# Patient Record
Sex: Female | Born: 1965
Health system: Southern US, Community
[De-identification: ages and names within clinical notes are randomized; demographics above are authoritative.]

## PROBLEM LIST (undated history)

## (undated) DIAGNOSIS — T4145XA Adverse effect of unspecified anesthetic, initial encounter: Secondary | ICD-10-CM

## (undated) DIAGNOSIS — R011 Cardiac murmur, unspecified: Secondary | ICD-10-CM

## (undated) DIAGNOSIS — T8859XA Other complications of anesthesia, initial encounter: Secondary | ICD-10-CM

## (undated) DIAGNOSIS — M1990 Unspecified osteoarthritis, unspecified site: Secondary | ICD-10-CM

## (undated) DIAGNOSIS — G8929 Other chronic pain: Secondary | ICD-10-CM

## (undated) DIAGNOSIS — R002 Palpitations: Secondary | ICD-10-CM

## (undated) DIAGNOSIS — F419 Anxiety disorder, unspecified: Secondary | ICD-10-CM

## (undated) DIAGNOSIS — M19072 Primary osteoarthritis, left ankle and foot: Secondary | ICD-10-CM

## (undated) DIAGNOSIS — I471 Supraventricular tachycardia, unspecified: Secondary | ICD-10-CM

## (undated) DIAGNOSIS — R7303 Prediabetes: Secondary | ICD-10-CM

## (undated) DIAGNOSIS — M1711 Unilateral primary osteoarthritis, right knee: Secondary | ICD-10-CM

## (undated) DIAGNOSIS — E669 Obesity, unspecified: Secondary | ICD-10-CM

## (undated) DIAGNOSIS — M549 Dorsalgia, unspecified: Secondary | ICD-10-CM

## (undated) DIAGNOSIS — I10 Essential (primary) hypertension: Secondary | ICD-10-CM

## (undated) DIAGNOSIS — K219 Gastro-esophageal reflux disease without esophagitis: Secondary | ICD-10-CM

## (undated) DIAGNOSIS — D649 Anemia, unspecified: Secondary | ICD-10-CM

## (undated) HISTORY — DX: Gastro-esophageal reflux disease without esophagitis: K21.9

## (undated) HISTORY — PX: BREAST BIOPSY: SHX20

## (undated) HISTORY — PX: LAPAROSCOPIC CHOLECYSTECTOMY: SUR755

## (undated) HISTORY — DX: Unilateral primary osteoarthritis, right knee: M17.11

## (undated) HISTORY — PX: WISDOM TOOTH EXTRACTION: SHX21

## (undated) HISTORY — PX: OTHER SURGICAL HISTORY: SHX169

## (undated) HISTORY — DX: Obesity, unspecified: E66.9

## (undated) HISTORY — DX: Other chronic pain: G89.29

## (undated) HISTORY — DX: Primary osteoarthritis, left ankle and foot: M19.072

## (undated) HISTORY — PX: LIPOMA RESECTION: SHX23

## (undated) HISTORY — DX: Palpitations: R00.2

## (undated) HISTORY — DX: Unspecified osteoarthritis, unspecified site: M19.90

---

## 2006-11-28 ENCOUNTER — Ambulatory Visit: Payer: Self-pay | Admitting: Internal Medicine

## 2006-12-07 ENCOUNTER — Ambulatory Visit: Payer: Self-pay

## 2006-12-08 ENCOUNTER — Encounter: Admission: RE | Admit: 2006-12-08 | Discharge: 2006-12-08 | Payer: Self-pay | Admitting: Internal Medicine

## 2007-03-22 ENCOUNTER — Encounter: Admission: RE | Admit: 2007-03-22 | Discharge: 2007-03-22 | Payer: Self-pay | Admitting: Internal Medicine

## 2007-04-06 ENCOUNTER — Ambulatory Visit: Payer: Self-pay | Admitting: Internal Medicine

## 2007-04-09 ENCOUNTER — Ambulatory Visit: Payer: Self-pay | Admitting: Internal Medicine

## 2007-04-09 LAB — CONVERTED CEMR LAB
BUN: 8 mg/dL (ref 6–23)
Basophils Absolute: 0.1 10*3/uL (ref 0.0–0.1)
Basophils Relative: 0.8 % (ref 0.0–1.0)
CO2: 33 meq/L — ABNORMAL HIGH (ref 19–32)
Calcium: 9.4 mg/dL (ref 8.4–10.5)
Chloride: 101 meq/L (ref 96–112)
Creatinine, Ser: 0.7 mg/dL (ref 0.4–1.2)
Eosinophils Absolute: 0 10*3/uL (ref 0.0–0.6)
Eosinophils Relative: 0.7 % (ref 0.0–5.0)
GFR calc Af Amer: 119 mL/min
GFR calc non Af Amer: 98 mL/min
Glucose, Bld: 138 mg/dL — ABNORMAL HIGH (ref 70–99)
HCT: 37.6 % (ref 36.0–46.0)
Hemoglobin: 12.4 g/dL (ref 12.0–15.0)
INR: 1 (ref 0.8–1.0)
Lymphocytes Relative: 43.8 % (ref 12.0–46.0)
MCHC: 32.9 g/dL (ref 30.0–36.0)
MCV: 95.5 fL (ref 78.0–100.0)
Monocytes Absolute: 0.5 10*3/uL (ref 0.2–0.7)
Monocytes Relative: 7.1 % (ref 3.0–11.0)
Neutro Abs: 3.1 10*3/uL (ref 1.4–7.7)
Neutrophils Relative %: 47.6 % (ref 43.0–77.0)
Platelets: 283 10*3/uL (ref 150–400)
Potassium: 3.3 meq/L — ABNORMAL LOW (ref 3.5–5.1)
Prothrombin Time: 12 s (ref 10.9–13.3)
RBC: 3.93 M/uL (ref 3.87–5.11)
RDW: 12.6 % (ref 11.5–14.6)
Sodium: 141 meq/L (ref 135–145)
WBC: 6.5 10*3/uL (ref 4.5–10.5)
aPTT: 36.9 s — ABNORMAL HIGH (ref 21.7–29.8)

## 2007-04-10 ENCOUNTER — Inpatient Hospital Stay (HOSPITAL_BASED_OUTPATIENT_CLINIC_OR_DEPARTMENT_OTHER): Admission: RE | Admit: 2007-04-10 | Discharge: 2007-04-10 | Payer: Self-pay | Admitting: Internal Medicine

## 2007-04-10 ENCOUNTER — Ambulatory Visit: Payer: Self-pay | Admitting: Cardiovascular Disease

## 2007-04-16 ENCOUNTER — Ambulatory Visit: Payer: Self-pay

## 2007-12-17 ENCOUNTER — Encounter: Admission: RE | Admit: 2007-12-17 | Discharge: 2007-12-17 | Payer: Self-pay | Admitting: Obstetrics and Gynecology

## 2008-07-03 ENCOUNTER — Encounter: Payer: Self-pay | Admitting: Internal Medicine

## 2008-09-14 ENCOUNTER — Ambulatory Visit (HOSPITAL_COMMUNITY): Admission: RE | Admit: 2008-09-14 | Discharge: 2008-09-14 | Payer: Self-pay | Admitting: Gastroenterology

## 2008-11-04 ENCOUNTER — Encounter: Payer: Self-pay | Admitting: Internal Medicine

## 2008-11-20 ENCOUNTER — Ambulatory Visit: Payer: Self-pay | Admitting: Cardiology

## 2008-11-20 ENCOUNTER — Encounter: Payer: Self-pay | Admitting: Nurse Practitioner

## 2008-11-20 DIAGNOSIS — R002 Palpitations: Secondary | ICD-10-CM | POA: Insufficient documentation

## 2008-11-20 DIAGNOSIS — J309 Allergic rhinitis, unspecified: Secondary | ICD-10-CM | POA: Insufficient documentation

## 2008-11-20 DIAGNOSIS — R079 Chest pain, unspecified: Secondary | ICD-10-CM | POA: Insufficient documentation

## 2008-11-20 DIAGNOSIS — K219 Gastro-esophageal reflux disease without esophagitis: Secondary | ICD-10-CM | POA: Insufficient documentation

## 2008-11-26 ENCOUNTER — Telehealth (INDEPENDENT_AMBULATORY_CARE_PROVIDER_SITE_OTHER): Payer: Self-pay | Admitting: *Deleted

## 2008-12-05 ENCOUNTER — Ambulatory Visit: Payer: Self-pay | Admitting: Internal Medicine

## 2008-12-10 ENCOUNTER — Telehealth (INDEPENDENT_AMBULATORY_CARE_PROVIDER_SITE_OTHER): Payer: Self-pay | Admitting: *Deleted

## 2009-01-07 ENCOUNTER — Ambulatory Visit: Payer: Self-pay | Admitting: Internal Medicine

## 2009-01-08 ENCOUNTER — Encounter (INDEPENDENT_AMBULATORY_CARE_PROVIDER_SITE_OTHER): Payer: Self-pay | Admitting: *Deleted

## 2009-02-16 ENCOUNTER — Encounter: Admission: RE | Admit: 2009-02-16 | Discharge: 2009-02-16 | Payer: Self-pay | Admitting: Obstetrics and Gynecology

## 2009-03-14 DIAGNOSIS — R002 Palpitations: Secondary | ICD-10-CM

## 2009-03-14 HISTORY — DX: Palpitations: R00.2

## 2009-03-31 ENCOUNTER — Telehealth: Payer: Self-pay | Admitting: Internal Medicine

## 2009-04-08 ENCOUNTER — Telehealth (INDEPENDENT_AMBULATORY_CARE_PROVIDER_SITE_OTHER): Payer: Self-pay | Admitting: *Deleted

## 2009-04-14 ENCOUNTER — Telehealth: Payer: Self-pay | Admitting: Internal Medicine

## 2010-03-12 ENCOUNTER — Encounter
Admission: RE | Admit: 2010-03-12 | Discharge: 2010-03-12 | Payer: Self-pay | Source: Home / Self Care | Attending: Obstetrics and Gynecology | Admitting: Obstetrics and Gynecology

## 2010-03-22 ENCOUNTER — Encounter
Admission: RE | Admit: 2010-03-22 | Discharge: 2010-03-22 | Payer: Self-pay | Source: Home / Self Care | Attending: Obstetrics and Gynecology | Admitting: Obstetrics and Gynecology

## 2010-04-13 NOTE — Assessment & Plan Note (Signed)
Summary: Cardiology Office Visit   Visit Type:  Follow-up  CC:  Palpitations .  History of Present Illness: 45 year old African American female with prior history of chest pain with normal catheterization January 2009.  She also has a long history of palpitations for which she takes p.r.n. propranolol.  Following her catheterization January 2009 she had not had any palpitations or need to take propranolol but about 2 weeks ago she started having increased, infrequent, single skipped beats/palpitations occurring one or 2 times an hour.  She saw her primary care provider and electrolytes and other lab work performed all of which was within normal limits.  with the usage of propranolol she had reduced events of palpitations but this past "Sunday she didn't take it and noted more frequent palpitations although no sustained runs.  Since this past Sunday she been taking propranolol daily and thinks that she's only had one or 2 skipped beats for the period of the week.  She denies any associated lightheadedness, presyncope, syncope, nausea, vomiting, chest pain, shortness of breath, or diaphoresis.  She reports that she's been under a lot of stress since going back to work.  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  EKG  Procedure date:  11/20/2008  Findings:      regular sinus rhythm, 82, no acute ST or T changes.   Current Medications (verified): 1)  Propranolol Hcl 20 Mg Tabs (Propranolol Hcl) .... Take 1 Tablet By Mouth Once A Day 2)  Maxzide-25 37.5-25 Mg Tabs (Triamterene-Hctz) .... 1/2 Tablet Daily 3)  Dexalant 60mg Tablet .... Take 1 Tablet By Mouth Once A Day 4)  Flovent Hfa 44 Mcg/act Aero (Fluticasone Propionate  Hfa) .... 2 Puffs As Needed 5)  Align  Caps (Probiotic Product) .... Twice A Day 6)  Stool Softener 100 Mg Caps (Docusate Sodium) .... As Needed 7)  Vitamin D 1000 Unit  Tabs  (Cholecalciferol) .... About 4x A Week 8)  Xanax 0.5 Mg Tabs (Alprazolam) .... As Needed  Allergies (verified): 1)  ! Doxycycline  Past History:  Past Medical History: Chest Pain      a.  S/P Normal Cath Jan. 2009.  NL EF.  GERD Allergies Palpitations  Family History: Reviewed history from 11/20/2008 and no changes required. Mother Mitral Valve Prolapse  Social History: She is married.  She has two children.  She does not   use cigarettes, alcohol or recreational drugs.  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  She reports a lot of work related stress.  Review of Systems       As per HPI.  OTW all systems reviewed and negative.  Vital Signs:  Patient profile:   45 year old female Height:      66 inches Weight:      164 pounds BMI:     26.57 Pulse rate:   81 / minute Pulse rhythm:   regular Resp:     12" 8 per minute BP sitting:   128 / 80  (right arm)  Vitals Entered By: Vikki Ports (November 20, 2008 9:17 AM)  Physical Exam  General:  Well developed, well nourished, in no acute distress. Head:  HEENT: NL Neck:  supple without bruits or JVD. Lungs:  Clear bilaterally to auscultation and percussion. Heart:  regular S1, S2, no S3, S4, or murmurs. Abdomen:  soft, nontender, nondistended, bowel sounds present x4. Msk:  Back normal, normal gait. Muscle strength and tone normal. Pulses:  pulses normal in all 4 extremities Extremities:  no clubbing, cyanosis, or edema. Neurologic:  Alert and oriented x 3. Skin:  Intact without lesions or rashes. Psych:  Normal affect.   Impression & Recommendations:  Problem # 1:  PALPITATIONS (ICD-785.1) patient has had increased palpitations over the past 2 weeks which he associates with going back to work.  She has now been on propranolol daily since this past Sunday and has had significant improvement.  I recommended she continue her propranolol.  We will obtain a 48-hour Holter monitor to try and better  define what she is experiencing. Orders: Holter Monitor (Holter Monitor)  Problem # 2:  GERD (ICD-530.81) she has struggled with this over the summer and is being followed by gastroenterology.  Problem # 3:  CHEST PAIN (ICD-786.50) no recurrence.  She had a normal catheterization in January 2009.  Patient Instructions: 1)  Your physician has recommended that you wear a holter monitor.  Holter monitors are medical devices that record the heart's electrical activity. Doctors most often use these monitors to diagnose arrhythmias. Arrhythmias are problems with the speed or rhythm of the heartbeat. The monitor is a small, portable device. You can wear one while you do your normal daily activities. This is usually used to diagnose what is causing palpitations/syncope (passing out). 2)  Your physician recommends that you continue on your current medications as directed. Please refer to the Current Medication list given to you today. 3)  Your physician recommends that you schedule a follow-up appointment in: 4 months with Dr. Graciela Husbands.

## 2010-04-13 NOTE — Progress Notes (Signed)
Summary: RETURNED CALL  Phone Note Call from Patient Call back at [phone]   Caller: Mom Summary of Call: RETURNED CALL REGARDING MONITOR Initial call taken by: Burnard Leigh,  November 26, 2008 11:00 AM  Additional Follow-up for Phone Call Additional follow up Details #1::        Monitor was done Additional Follow-up by: Marcos Eke,  January 26, 2009 4:14 PM

## 2010-04-13 NOTE — Progress Notes (Signed)
  Phone Note Outgoing Call   Call placed by: Duncan Dull, RN, BSN,  December 10, 2008 2:42 PM Call placed to: Patient Summary of Call: To discuss holter results Called patient and left message on machine Duncan Dull, RN, BSN  December 10, 2008 2:42 PM No information colled from the holter. The noted SVT appeared to be artifact. If she is having symptoms then she will need to follow up with an event recorder. If went order an event recorder, Dr. Graciela Husbands will need to send a letter to pt's insurance company asking them to pay for the additional test.  Initial call taken by: Duncan Dull, RN, BSN,  December 10, 2008 2:44 PM     Appended Document:  S/W pt and she feels better since she started the propranolol. She wishes to make an appt to see Dr. Graciela Husbands to give herself more time and they will discuss the event monitor and the need for it or not at that time.

## 2010-04-13 NOTE — Miscellaneous (Signed)
Summary: Orders Update  Clinical Lists Changes  Orders: Added new Service order of EKG w/ Interpretation (93000) - Signed 

## 2010-04-13 NOTE — Progress Notes (Signed)
  Phone Note Other Incoming   Caller: Patient Summary of Call: She was returning my call. She would like a female councelor. I will pass this on to Dr. Graciela Husbands tomorrow Duncan Dull, RN, BSN  April 08, 2009 4:27 PM   Follow-up for Phone Call        Called patient and left message on machine with information she requested. Per Dr. Graciela Husbands, the ladies name was Colen Darling, per internet [phone]. Follow-up by: Duncan Dull, RN, BSN,  April 15, 2009 3:03 PM

## 2010-04-13 NOTE — Progress Notes (Signed)
Summary: regarding referral  Phone Note Call from Patient Call back at [phone]   Caller: Patient Reason for Call: Talk to Nurse Summary of Call: haven't heard back from [facility] regading the counsellor's name that Dr. Graciela Husbands is refering her too Initial call taken by: Omer Jack,  April 14, 2009 11:07 AM  Follow-up for Phone Call        Please see duplicate phone message. Follow-up by: Duncan Dull, RN, BSN,  April 15, 2009 3:04 PM

## 2010-04-13 NOTE — Letter (Signed)
Summary: [facility]  [facility]   Imported By: Roderic Ovens 12/17/2008 16:53:55  _____________________________________________________________________  External Attachment:    Type:   Image     Comment:   External Document

## 2010-04-13 NOTE — Progress Notes (Signed)
Summary:  PT NEEDS REFERRAL  Phone Note Call from Patient Call back at [phone]   Caller: Patient Reason for Call: Talk to Nurse, Talk to Doctor Summary of Call: PT NEEDS SOME NAMES OF CHRISTIAN COUNSELERS.Marland KitchenMarland KitchenAT LAST VISIT MD TOLD HER HE COULD GET THIS INFORMATION...THIS IS TO HELP HER DESTRESS.. Initial call taken by: Omer Jack,  March 31, 2009 1:03 PM  Follow-up for Phone Call        Question per Dr. Graciela Husbands is does she want a man or woman? I will call her on Fri. when I return. Duncan Dull, RN, BSN  April 01, 2009 6:52 PM   Called patient and left message on machine for pt to call me Duncan Dull, RN, BSN  April 03, 2009 11:43 AM

## 2010-04-13 NOTE — Assessment & Plan Note (Signed)
Summary: rov/per Kimberly Boyd   History of Present Illness: Kimberly Boyd is seen at the request of Dr. Loreta Ave because of recurrent problems with palpitations. She describes these as "clips".  She was last seen by me in the fall of 2008. At that time no complaints of chest pain. She underwent CT scan that was negative. Because of persistent pain she subsequently was referred for catheterization that was normal. I reviewed those results morning.  Her symptoms have been abated somewhat. She sees him in the temporal relation to Xanax as she was taking for sleep. 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 She started taking her Inderal again with some improvement.  She continues to complain of pain with related to GE reflux disease  She also describes a great deal of stress related to her recent move to [place], children school work etc. She is actually in tears as she discussed some of these issues.  Problems Prior to Update: 1)  Palpitations  (ICD-785.1) 2)  Allergic Rhinitis  (ICD-477.9) 3)  Gerd  (ICD-530.81) 4)  Chest Pain  (ICD-786.50)  Current Medications (verified): 1)  Propranolol Hcl 20 Mg Tabs (Propranolol Hcl) .... Take 1 Tablet By Mouth Once A Day 2)  Maxzide-25 37.5-25 Mg Tabs (Triamterene-Hctz) .... 1/2 Tablet Daily 3)  Dexalant 60mg  Tablet .... Take 1 Tablet By Mouth Once A Day 4)  Flovent Hfa 44 Mcg/act Aero (Fluticasone Propionate  Hfa) .... 2 Puffs As Needed 5)  Align  Caps (Probiotic Product) .... Twice A Day 6)  Stool Softener 100 Mg Caps (Docusate Sodium) .... As Needed 7)  Vitamin D 1000 Unit  Tabs (Cholecalciferol) .... About 4x A Week 8)  Xanax 0.5 Mg Tabs (Alprazolam) .... As Needed  Allergies (verified): 1)  ! Doxycycline  Past History:  Past Medical History: Last updated: 11/20/2008 Chest Pain      a.  S/P Normal Cath Jan. 2009.  NL EF.  GERD Allergies Palpitations  Past Surgical History: Last updated:  11/20/2008  1. A laparoscopic cholecystectomy.   2. Lipoma resection and redone.   3. Scar revision for the lipoma.   4. Dermoid cyst removal.   Social History: Last updated: 11/20/2008 She is married.  She has two children.  She does not   use cigarettes, alcohol or recreational drugs.  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  She reports a lot of work related stress.  Vital Signs:  Patient profile:   45 year old female Height:      66 inches Weight:      163 pounds BMI:     26.40 Pulse rate:   77 / minute BP sitting:   118 / 76  (left arm) Cuff size:   regular  Vitals Entered By: Hardin Negus, RMA (January 07, 2009 12:22 PM)  Physical Exam  General:  The patient was alert and oriented in no acute distress. Neck veins were flat, carotids were brisk.  Lungs were clear.  Heart sounds were regular without murmurs or gallops.  Abdomen was soft with active bowel sounds. There is no clubbing cyanosis or edema. Skin Warm and dry    Cardiac Cath  Procedure date:  04/10/2007  Findings:       IMPRESSION:  The patient has no coronary anomalies, no evidence of spasm   or bridging, no epicardial coronary disease.  Her chest pain would   appear to be noncardiac in etiology.  EKG  Procedure date:  04/10/2007  Findings:      Sinus rhythm at 77 Intervals 0.14/0.09/0.40 Axis LXXIX Otherwise normal  EKG  Procedure date:  01/07/2009  Findings:      sinus rhythm at 77 Intervals 0.1/0.09/0.40 Axis LXXIX  Impression & Recommendations:  Problem # 1:  PALPITATIONS (ICD-785.1) Her palpitations are currently fairly well controlled with her propranolol. She takes it at night. It is interesting to me at the short-acting dose seems to cover most of the day. We'll also discussed the potential benefits of biofeedback as an alternative therapy for palpitations. Her updated medication list for this problem includes:    Propranolol Hcl 20 Mg Tabs  (Propranolol hcl) .Marland Kitchen... Take 1 tablet by mouth once a day  Problem # 2:  STRESS (ICD-300.00) more than 50% of our greater than 25 minute visit was spent counseling on stress and discussing e potential management strategies;  she'll discuss some of these with her husband and get back with Kimberly Boyd regarding pursuing some of them.  Patient Instructions: 1)  Your physician recommends that you schedule a follow-up appointment AS NEEDED.

## 2010-07-27 NOTE — Letter (Signed)
November 28, 2006    Candyce Churn. Allyne Gee, M.D.  [address]  [address]  [place], [place] [address]   RE:  Kimberly Boyd  MRN:  [id number]  /  DOB:  02/22/1966   Dear Melina Schools:   I hope this letter finds you well.  It was a pleasure to meet Kimberly Boyd, who is Kimberly Boyd' sister-in-law, as you know.  She is a 45-  year-old married mother of two, who has recently moved from [facility] and  [place] certainly  [place] to [place] yet.   She has a history of cardiac complaints that date back a number of  years.  This includes chest pain, for which she underwent a stress test  about four or five years ago and significant palpitations which have  included different symptom complexes, including fluttering, pounding and  racing.  The latter seemed to have largely abated following aggressive  therapy for GE reflux disease.  The fluttering and bounding have  persisted.  She has been monitored a couple of times with event  recorders and these have shown PVCs, non-sustained atrial tachycardia  and on one episode a ventricular couplet which may or may not involve  one artifactual beat.  She does not use caffeine.  Stress has not been a  big component of this in that the symptoms have not been significantly  worse as she has moved from [facility] to [place].   She also describes recently chest discomfort using a Levine sign.  This  occurs when she is walking up hills but not when running up stairs.  It  radiates into her arms bilaterally.  If she does not eat before she  walks, she does not notice it.   Her cardiac risk factors are notable for borderline blood pressure.  Her  family history is negative.  Her lipid status is negative.  Diabetes is  negative.  She does not smoke.   PAST MEDICAL HISTORY:  1. In addition to the above, is notable for the GE reflux disease as      noted.  2. Allergies.   PAST SURGICAL HISTORY:  1. A  laparoscopic cholecystectomy.  2. Lipoma resection and redone.  3. Scar revision for the lipoma.  4. Dermoid cyst removal.   CURRENT MEDICATIONS:  1. Propranolol, taking 20 mg once daily.  2. A recently started PPI, changing out for Prilosec.   ALLERGIES:  She is intolerant of DOXYCYCLINE.   SOCIAL HISTORY:  She is married.  She has two children.  She does not  use cigarettes, alcohol or recreational drugs.   PHYSICAL EXAMINATION:  GENERAL:  She is a healthy-appearing middle-aged  African/American female who appears her stated age of 63.  VITAL SIGNS:  Blood pressure 140/91, pulse 71, weight 187 pounds.  HEENT:  No icterus or xanthoma.  NECK:  Veins are flat.  Carotids brisk and full bilaterally without  bruits.  BACK:  Without kyphosis or scoliosis.  There was a thick keloid scar  over her left back.  HEART:  Sounds were regular without gallops.  There was a 2/6 murmur  (non-revealed).  ABDOMEN:  Soft with active bowel sounds.  No midline pulsation or  hepatomegaly.  PULSES:  Femoral pulses 2+.  Distal pulses intact.  There is no  clubbing, cyanosis or edema.  NEUROLOGIC:  Grossly normal.  SKIN:  Warm and dry.   Electrocardiogram dated today demonstrated a sinus rhythm  at 72 and  there was a 0.17/0.08/0.39.  The axis was 70 degrees.  The  electrocardiogram was otherwise normal.   IMPRESSION:  1. Palpitations including documented      a.     Premature atrial contractions and non-sustained atrial       tachycardia.      b.     Premature ventricular contractions.      c.     Question isolated couplet.  2. Heart racing which is largely improved following treatment for      gastroesophageal reflux disease.  3. Exertional chest discomfort and the use of a Levine sign.  4. Gastroesophageal reflux disease potentially contributing to number      three.  5. Modest degree of anxiety, related to her heart.   Kimberly Boyd, Kimberly Boyd palpitations are likely related to the PACs and  PVCs  that we noted, and I went over that with her.  She is taking propranolol  once a day at night, which is an unusual dosing regimen but it seems to  be working for her, and we decided to leave it at this without  increasing her daytime medication, as there may be some associated  fatigue.   I am concerned about her chest pain, particularly because she uses the  Dewart sign, not withstanding that she had a negative stress test four  years ago.  I also reviewed with her the potential that we will have a  negative test and she will have more pain, and that a catheterization  may be something to consider, given the degree of anxiety she has about  her chest pain.   We will follow up with her following the aforementioned testing.    Sincerely,      Duke Salvia, MD, [facility]  Electronically Signed    SCK/MedQ  DD: 11/28/2006  DT: 11/28/2006  Job #: [id number]

## 2010-07-27 NOTE — Assessment & Plan Note (Signed)
[facility]                            CARDIOLOGY OFFICE NOTE   NAME:GREENLeonor, Darnell                         MRN:          [id number]  DATE:04/16/2007                            DOB:          04/13/1965    Alfonso called the office today due to an ecchymosis in the right groin.  She was catheterized on April 10, 2007.  She had no significant  coronary disease.  I had her come to the office. She was not charged for  it. She had an ecchymosis over the right femoral artery area.  There was  no bruit.  I suspect she had a small late bleed.  There was no evidence  of pseudoaneurysm; however, I told the patient I would feel more  comfortable if she had a duplex exam.  Our ultrasound tech was kind  enough to be able to do this in the next 20 minutes.   The patient was encouraged to put ice on the area at night. She will  limit her activities. She did not want anything stronger than Tylenol or  aspirin for the pain.   So long as her ultrasound shows no evidence of a pseudoaneurysm, she  will be allowed to leave the office today.   Addendum:  Duplex showed no pseudo-aneurysm or fistula     Peter C. Eden Emms, MD, [facility]  Electronically Signed    PCN/MedQ  DD: 04/16/2007  DT: 04/16/2007  Job #: [id number]

## 2010-07-27 NOTE — Assessment & Plan Note (Signed)
[facility]                         ELECTROPHYSIOLOGY OFFICE NOTE   NAME:[name], Boyd                         MRN:          [id number]  DATE:04/06/2007                            DOB:          1965/08/23    SUBJECTIVE:  Kimberly Boyd is seen today again at the request of Dr. Loreta Ave  because of recurrent problems with chest pain.  I had seen her in  September.  At that point there was a positive Levine sign.  We  undertook Myoview scanning; this was negative, notwithstanding the  presence of some chest pain.  This issue of chest pain and exertional  chest pain has been ongoing, however.  The discussion back in September  was that if she had recurring exertional chest pain that a  catheterization with its' positive and negative predictive values and  sensitivity would be an unfortunate, but probably most reasonable,  adjunct.   CURRENT MEDICATIONS:  Her medications currently include Maxzide and  propranolol 20/40.   PHYSICAL EXAMINATION:  VITAL SIGNS:  Her blood pressure is 127/84, her  pulse was 70.  LUNGS:  Clear.  HEART:  Heart sounds were regular.  ABDOMEN:  Soft.  EXTREMITIES:  Were without edema.  SKIN:  Warm and dry.   Electrocardiogram dated today demonstrated a sinus rhythm at 69,  intervals were 0.17/0.08/0.03.  The axis was 75 degrees and the  __________ is otherwise normal.   IMPRESSION:  1. Recurrent exertional chest pain.  2. Negative Myoview scan.  3. Gastroesophageal reflux disease.   The issue that Dr. Loreta Ave has raised is that this chest pain is  incapacitating and it continues to be exertional in nature.  Given the  fact that Myoview scanning and prognosis is in the 90% range, but this  lady's life is persistently disrupted by this chest pain, we have  discussed alternative testing with higher sensitivities including a CTA  and catheterization.  Unfortunately, CTA is not approved here in almost  anybody and thus we have  elected to pursue catheterization.  We have  talked about the potential benefits as well as the potential risks  including but not limited to death and vascular injury.  She understands  these risks and would like to proceed.   We will schedule this for next week as her schedule permits.    Duke Salvia, MD, [facility]  Electronically Signed   SCK/MedQ  DD: 04/06/2007  DT: 04/06/2007  Job #: [id number]   cc:   Anselmo Rod, M.D.

## 2010-07-27 NOTE — Cardiovascular Report (Signed)
NAMEEGYPT, WELCOME                ACCOUNT NO.:  [id number]   MEDICAL RECORD NO.:  [id number]          PATIENT TYPE:  OIB   LOCATION:  1966                         FACILITY:  [facility]   PHYSICIAN:  Peter C. Eden Emms, MD, FACCDATE OF BIRTH:  1965/08/28   DATE OF PROCEDURE:  04/10/2007  DATE OF DISCHARGE:                            CARDIAC CATHETERIZATION   A 45 year old patient with recurrent chest pain.  Rule out coronary  disease.   Cine cath was done with 4-French catheter from the right femoral artery.  Versed 2 mg was given for sedation, 2.5 mg of Lopressor was given for  resting tachycardia.   Left main coronary artery was normal.   Left anterior descending artery was normal.  First diagonal branch was  very large and normal.  Second diagonal branch was medium-sized and  normal.   The LAD was a large vessel and wrapped the apex and supplied most of the  PDA distribution.   Circumflex coronary artery was nondominant and consisted primarily of a  large branching obtuse marginal branch and was normal.   The right coronary artery was dominant but the PDA was small, covering  only the basal aspect of the crux of the heart with the LAD supplying  the rest.  The right coronary artery was normal.   RAO ventriculography:  RAO ventriculography was normal.  EF was 60%.  There is no gradient across the aortic valve and no MR.   LV pressure is 127/10.  Aortic pressure is 127/88.   IMPRESSION:  The patient has no coronary anomalies, no evidence of spasm  or bridging, no epicardial coronary disease.  Her chest pain would  appear to be noncardiac in etiology.   She will follow with Dr. Lelon Perla in 2-3 weeks.      Noralyn Pick. Eden Emms, MD, [facility]  Electronically Signed    PCN/MEDQ  D:  04/10/2007  T:  04/10/2007  Job:  [id number]   cc:   Candyce Churn. Allyne Gee, M.D.  Duke Salvia, MD, [facility]  Kassie Mends, M.D.

## 2010-08-04 ENCOUNTER — Emergency Department (INDEPENDENT_AMBULATORY_CARE_PROVIDER_SITE_OTHER): Payer: Commercial Indemnity

## 2010-08-04 ENCOUNTER — Emergency Department (HOSPITAL_BASED_OUTPATIENT_CLINIC_OR_DEPARTMENT_OTHER)
Admission: EM | Admit: 2010-08-04 | Discharge: 2010-08-04 | Disposition: A | Discharge status: Home / Self Care | Payer: Commercial Indemnity | Attending: Emergency Medicine | Admitting: Emergency Medicine

## 2010-08-04 ENCOUNTER — Ambulatory Visit: Payer: Commercial Indemnity | Admitting: Physical Therapy

## 2010-08-04 DIAGNOSIS — R0602 Shortness of breath: Secondary | ICD-10-CM | POA: Insufficient documentation

## 2010-08-04 DIAGNOSIS — R079 Chest pain, unspecified: Secondary | ICD-10-CM | POA: Insufficient documentation

## 2010-08-04 DIAGNOSIS — R002 Palpitations: Secondary | ICD-10-CM | POA: Insufficient documentation

## 2010-08-04 DIAGNOSIS — K219 Gastro-esophageal reflux disease without esophagitis: Secondary | ICD-10-CM | POA: Insufficient documentation

## 2010-08-04 DIAGNOSIS — I498 Other specified cardiac arrhythmias: Secondary | ICD-10-CM | POA: Insufficient documentation

## 2010-08-04 LAB — DIFFERENTIAL
Basophils Absolute: 0 10*3/uL (ref 0.0–0.1)
Basophils Relative: 0 % (ref 0–1)
Eosinophils Absolute: 0.1 10*3/uL (ref 0.0–0.7)
Eosinophils Relative: 2 % (ref 0–5)
Lymphocytes Relative: 47 % — ABNORMAL HIGH (ref 12–46)
Lymphs Abs: 3.4 10*3/uL (ref 0.7–4.0)
Monocytes Absolute: 0.6 10*3/uL (ref 0.1–1.0)
Monocytes Relative: 8 % (ref 3–12)
Neutro Abs: 3 10*3/uL (ref 1.7–7.7)
Neutrophils Relative %: 42 % — ABNORMAL LOW (ref 43–77)

## 2010-08-04 LAB — BASIC METABOLIC PANEL
BUN: 13 mg/dL (ref 6–23)
CO2: 22 mEq/L (ref 19–32)
Calcium: 9.5 mg/dL (ref 8.4–10.5)
Chloride: 102 mEq/L (ref 96–112)
Creatinine, Ser: 0.6 mg/dL (ref 0.4–1.2)
GFR calc Af Amer: >60 mL/min (ref >60)
GFR calc non Af Amer: >60 mL/min (ref >60)
Glucose, Bld: 117 mg/dL — ABNORMAL HIGH (ref 70–99)
Potassium: 2.7 mEq/L — CL (ref 3.5–5.1)
Sodium: 139 mEq/L (ref 135–145)

## 2010-08-04 LAB — CBC
HCT: 36.1 % (ref 36.0–46.0)
Hemoglobin: 12.7 g/dL (ref 12.0–15.0)
MCH: 31.5 pg (ref 26.0–34.0)
MCHC: 35.2 g/dL (ref 30.0–36.0)
MCV: 89.6 fL (ref 78.0–100.0)
Platelets: 299 10*3/uL (ref 150–400)
RBC: 4.03 MIL/uL (ref 3.87–5.11)
RDW: 13.6 % (ref 11.5–15.5)
WBC: 7.2 10*3/uL (ref 4.0–10.5)

## 2010-08-04 LAB — CARDIAC PANEL(CRET KIN+CKTOT+MB+TROPI)
CK, MB: 1.4 ng/mL (ref 0.3–4.0)
Relative Index: 1.2 (ref 0.0–2.5)
Total CK: 117 U/L (ref 7–177)
Troponin I: <0.3 ng/mL (ref <0.30)

## 2010-08-13 ENCOUNTER — Encounter: Payer: Self-pay | Admitting: Internal Medicine

## 2010-08-23 ENCOUNTER — Telehealth: Payer: Self-pay | Admitting: Internal Medicine

## 2010-08-23 DIAGNOSIS — R002 Palpitations: Secondary | ICD-10-CM

## 2010-08-23 NOTE — Telephone Encounter (Signed)
I attempted to reach the pt but memory is full at number provided.  Per 08/04/10 ER note this pt was given a Rx for Propanolol 20mg  take one by mouth two times a day as needed for palpitations.  The pt was given a quantity of 20 at that time.  The pt does have an upcoming appointment with Dr Graciela Husbands on 08/31/10.  Will need to verify pt's pharmacy before calling in medication ([address] [facility] is listed in [place] not [place]).

## 2010-08-23 NOTE — Telephone Encounter (Signed)
Pt needs refill on propanolol 20mg  qd called into [facility] on [address] she was seen in ED and has appt on the 19th but doesn't have enough pills to last until ov. Please call pt to let her know when this medication is called in.

## 2010-08-23 NOTE — Telephone Encounter (Signed)
Attempted to reach pt again and memory is full.

## 2010-08-24 ENCOUNTER — Ambulatory Visit: Payer: Commercial Indemnity | Attending: Physician Assistant | Admitting: Physical Therapy

## 2010-08-24 DIAGNOSIS — M545 Low back pain, unspecified: Secondary | ICD-10-CM | POA: Insufficient documentation

## 2010-08-24 DIAGNOSIS — M25559 Pain in unspecified hip: Secondary | ICD-10-CM | POA: Insufficient documentation

## 2010-08-24 DIAGNOSIS — IMO0001 Reserved for inherently not codable concepts without codable children: Secondary | ICD-10-CM | POA: Insufficient documentation

## 2010-08-24 MED ORDER — PROPRANOLOL HCL 20 MG PO TABS: ORAL_TABLET | ORAL | Status: DC

## 2010-08-24 NOTE — Telephone Encounter (Signed)
I attempted to call the patient. Memory is full. I will go ahead and send in refill to [facility] on [address]. Since I am unable to reach the patient. I will try to contact the patient later.

## 2010-08-25 NOTE — Telephone Encounter (Signed)
I left a message on the patient's voice mail that her RX was sent to the pharmacy yesterday.

## 2010-08-31 ENCOUNTER — Encounter: Payer: Self-pay | Admitting: Internal Medicine

## 2010-08-31 ENCOUNTER — Ambulatory Visit (INDEPENDENT_AMBULATORY_CARE_PROVIDER_SITE_OTHER): Payer: Commercial Indemnity | Admitting: Internal Medicine

## 2010-08-31 VITALS — BP 120/78 | HR 69 | Ht 67.0 in | Wt 182.0 lb

## 2010-08-31 DIAGNOSIS — I498 Other specified cardiac arrhythmias: Secondary | ICD-10-CM

## 2010-08-31 DIAGNOSIS — I471 Supraventricular tachycardia: Secondary | ICD-10-CM | POA: Insufficient documentation

## 2010-08-31 DIAGNOSIS — F439 Reaction to severe stress, unspecified: Secondary | ICD-10-CM | POA: Insufficient documentation

## 2010-08-31 MED ORDER — ATENOLOL 50 MG PO TABS: 50.0000 mg | ORAL_TABLET | Freq: Every day | ORAL | Status: DC

## 2010-08-31 MED ORDER — PROPRANOLOL HCL 80 MG PO CP24: 80.0000 mg | ORAL_CAPSULE | Freq: Every day | ORAL | Status: DC

## 2010-08-31 MED ORDER — METOPROLOL SUCCINATE ER 50 MG PO TB24: 50.0000 mg | ORAL_TABLET | Freq: Every day | ORAL | Status: DC

## 2010-08-31 NOTE — Assessment & Plan Note (Signed)
The patient has had an isolated episode of reentrant SVT that was adenosine sensitive. He'll cardiogram suggests a relatively late retrograde P wave although there is a small Q. In the inferior leads suggestive of a pseudo-Q waves. The episodes were Frog negative. In any case, given this as the initial episode we will follow at. As there is a lot going on the summer we will give her prescriptions for 3 different beta blockers to see which she can tolerate without fatigue which was not true for the short acting Inderal. Specifically, we will give her Inderal LA 80, atenolol 50, and metoprolol succinate 50.

## 2010-08-31 NOTE — Progress Notes (Signed)
  HPI  Kimberly Boyd is a 45 y.o. female  Following a recent trip to the emergency room for a different type of tachycardia palpitation that turned out to be adenosine sensitive SVT.  She continues to struggle with sighnificant stress at work and at home. Past Medical History  Diagnosis Date  . Chest pain     s/p normal cath Jan. 2009. NL EF  . GERD (gastroesophageal reflux disease)   . Multiple allergies   . Palpitations     Past Surgical History  Procedure Date  . Laparoscopic cholecystectomy   . Lipoma resection     and redone  . Scar revision for the lipoma   . Dermoid cyst removal     Current Outpatient Prescriptions  Medication Sig Dispense Refill  . ALPRAZolam (XANAX) 0.5 MG tablet Take 0.5 mg by mouth as needed.        . docusate sodium (COLACE) 100 MG capsule Take 100 mg by mouth as needed.        . NON FORMULARY Dexalant 60 mg tablets. Take 1 tablet PO daily.       . Probiotic Product (ALIGN PO) Take by mouth 2 (two) times daily.        . propranolol (INDERAL) 20 MG tablet Take one tablet by mouth twice daily as needed  60 tablet  0  . triamterene-hydrochlorothiazide (MAXZIDE-25) 37.5-25 MG per tablet Take 0.5 tablets by mouth daily.        Marland Kitchen DISCONTD: cholecalciferol (VITAMIN D) 1000 UNITS tablet Take 1,000 Units by mouth daily. About 4x a week       . DISCONTD: fluticasone (FLOVENT HFA) 44 MCG/ACT inhaler Inhale 2 puffs into the lungs as needed.          Allergies  Allergen Reactions  . Doxycycline     Review of Systems negative except from HPI and PMH  Physical Exam Well developed and well nourished in no acute distress HENT normal E scleral and icterus clear Neck Supple JVP flat; carotids brisk and full Clear to ausculation Regular rate and rhythm, no murmurs gallops or rub Soft with active bowel sounds No clubbing cyanosis and edema Alert and oriented, grossly normal motor and sensory function Skin Warm and Dry  ECG sinus rhythm at 70 Intervals  0.17 5.08/0.39 Otherwise normal  Assessment and  Plan

## 2010-08-31 NOTE — Patient Instructions (Addendum)
Your physician recommends that you schedule a follow-up appointment in: 3 months.  You have been given 3 prescriptions to try. You may use these in any order in 2 week increments. Do not take them all together. You may let us know which one you like the best and we can re-order that for you. 1) Inderal LA 80mg  one capsule daily. 2) Atenolol 50mg  one tablet daily. 3) Metoprolol succ 50mg  one tablet daily.

## 2010-08-31 NOTE — Assessment & Plan Note (Signed)
revieewee situation and gave her the name of Enis Slipper

## 2010-09-01 NOTE — Progress Notes (Signed)
Addended by: Kem Parkinson on: 09/01/2010 04:41 PM   Modules accepted: Orders

## 2010-09-02 NOTE — Progress Notes (Signed)
Addended by: Kem Parkinson on: 09/02/2010 10:54 AM   Modules accepted: Orders

## 2010-09-06 ENCOUNTER — Ambulatory Visit: Payer: Commercial Indemnity | Admitting: Physical Therapy

## 2010-09-13 ENCOUNTER — Ambulatory Visit: Payer: Commercial Indemnity | Attending: Physician Assistant | Admitting: Rehabilitation

## 2010-09-13 ENCOUNTER — Telehealth: Payer: Self-pay | Admitting: Internal Medicine

## 2010-09-13 DIAGNOSIS — M545 Low back pain, unspecified: Secondary | ICD-10-CM | POA: Insufficient documentation

## 2010-09-13 DIAGNOSIS — IMO0001 Reserved for inherently not codable concepts without codable children: Secondary | ICD-10-CM | POA: Insufficient documentation

## 2010-09-13 DIAGNOSIS — M25559 Pain in unspecified hip: Secondary | ICD-10-CM | POA: Insufficient documentation

## 2010-09-13 NOTE — Telephone Encounter (Signed)
This is a relatively low dose but if it is effective, that would be great. We can always try an alternative beta blocker.

## 2010-09-13 NOTE — Telephone Encounter (Signed)
I spoke with the patient. She states she has tried the Inderal LA 80mg  once daily and she feels bad taking this. She stated she has plain Inderal 20mg  tablets at home that she has not taken in a over a 1 1/2 years. She wanted to know if Dr. Graciela Husbands felt she could try this instead for her SVT because she states she did feel ok taking that. I will review with Dr. Graciela Husbands and call her back.

## 2010-09-13 NOTE — Telephone Encounter (Signed)
LMTC

## 2010-09-13 NOTE — Telephone Encounter (Signed)
The patient is aware of Dr. Odessa Fleming recommendations. Kimberly Boyd has about 3 weeks worth of propranolol left. Kimberly Boyd will call us for a refill in a couple of weeks if Kimberly Boyd feels like this is working for her.

## 2010-09-13 NOTE — Telephone Encounter (Signed)
Pt rtn call and you can reach her at same number

## 2010-09-13 NOTE — Telephone Encounter (Signed)
Per pt call. Pt has question concerning new medicine Dr. Graciela Husbands put pt on. Pt also wants to know if it is ok to go back to exercising.

## 2010-09-21 ENCOUNTER — Ambulatory Visit: Payer: Commercial Indemnity | Admitting: Rehabilitation

## 2010-09-30 ENCOUNTER — Ambulatory Visit (INDEPENDENT_AMBULATORY_CARE_PROVIDER_SITE_OTHER): Payer: Commercial Indemnity | Admitting: Internal Medicine

## 2010-09-30 ENCOUNTER — Encounter: Payer: Self-pay | Admitting: Internal Medicine

## 2010-09-30 VITALS — BP 134/88 | HR 71 | Temp 98.7°F | Wt 184.1 lb

## 2010-09-30 DIAGNOSIS — R509 Fever, unspecified: Secondary | ICD-10-CM

## 2010-09-30 NOTE — Assessment & Plan Note (Addendum)
Fever - patient does not qualify for fever of unknown origin with no fever for > 1 week and only one known fever to 100.7.  Some symptoms of "hot" but associated with normal temperature (99).  I educated the patient on what is a normal temperature of anything < 100.4 and concern would be if she develops a persistent fever of 101 daily for 2 weeks without a focus.  At this time I have told her there is no need to continue taking her temperature and that there is no indication for continued antibiotic therapy and to stop all antibiotics.    Return prn

## 2010-09-30 NOTE — Progress Notes (Signed)
  Subjective:    Patient ID: Kimberly Boyd, female    DOB: 1965/11/10, 45 y.o.   MRN: [id number]  HPI 45 year old female with hx of SVT on meds here for concern of persistent fever.  She reports temps ranging from 98-100.7, with the only temp over 100.4 last week and occurred 1 time.  No associated symptoms including no headache, sore throat, diarrhea, dysuria, abdominal pain.  She has had a CBC and BMP done by the urologist and a UA which are reported to all be wnl, except a mildly elevated sugar.  She has been on a course of keflex and currently on cipro for urinary concerns.  No sick contacts, no recent travel outside of the country.      Review of Systems  Constitutional: Negative.   HENT: Negative.   Eyes: Negative.   Respiratory: Negative.   Cardiovascular: Negative.   Gastrointestinal: Negative.   Genitourinary: Negative.   Musculoskeletal: Negative.   Skin: Negative.   Neurological: Negative.   Hematological: Negative.   Psychiatric/Behavioral: Negative.        Objective:   Physical Exam  Constitutional: She is oriented to person, place, and time. She appears well-developed and well-nourished.       anxious  HENT:  Right Ear: External ear normal.  Left Ear: External ear normal.  Nose: Nose normal.  Mouth/Throat: Oropharynx is clear and moist. No oropharyngeal exudate.  Eyes: Pupils are equal, round, and reactive to light. Right eye exhibits no discharge. Left eye exhibits no discharge. No scleral icterus.  Neck: Normal range of motion. Neck supple. No thyromegaly present.  Cardiovascular: Normal rate, regular rhythm and normal heart sounds.  Exam reveals no gallop and no friction rub.   No murmur heard. Pulmonary/Chest: Effort normal and breath sounds normal. No respiratory distress. She has no wheezes. She has no rales.  Abdominal: Soft. Bowel sounds are normal. She exhibits no distension. There is no tenderness. There is no rebound.  Musculoskeletal: Normal range of  motion.  Lymphadenopathy:    She has no cervical adenopathy.  Neurological: She is alert and oriented to person, place, and time.  Skin: Skin is warm and dry. No rash noted. No erythema. No pallor.  Psychiatric: Her behavior is normal.          Assessment & Plan:

## 2010-10-19 ENCOUNTER — Other Ambulatory Visit: Payer: Self-pay | Admitting: Internal Medicine

## 2010-11-04 ENCOUNTER — Other Ambulatory Visit: Payer: Self-pay | Admitting: Internal Medicine

## 2010-11-15 ENCOUNTER — Other Ambulatory Visit: Payer: Self-pay | Admitting: Internal Medicine

## 2010-12-02 ENCOUNTER — Other Ambulatory Visit: Payer: Self-pay | Admitting: Internal Medicine

## 2010-12-07 ENCOUNTER — Ambulatory Visit: Payer: Commercial Indemnity | Admitting: Internal Medicine

## 2011-06-20 ENCOUNTER — Other Ambulatory Visit: Payer: Self-pay | Admitting: Obstetrics and Gynecology

## 2011-06-20 DIAGNOSIS — D219 Benign neoplasm of connective and other soft tissue, unspecified: Secondary | ICD-10-CM

## 2011-06-28 ENCOUNTER — Inpatient Hospital Stay: Admission: RE | Admit: 2011-06-28 | Payer: Commercial Indemnity | Source: Ambulatory Visit

## 2011-06-28 ENCOUNTER — Other Ambulatory Visit: Payer: Self-pay

## 2011-06-28 DIAGNOSIS — I471 Supraventricular tachycardia: Secondary | ICD-10-CM

## 2011-06-28 NOTE — Telephone Encounter (Signed)
..   Requested Prescriptions   Pending Prescriptions Disp Refills  . metoprolol succinate (TOPROL-XL) 50 MG 24 hr tablet 30 tablet 2    Sig: Take 1 tablet (50 mg total) by mouth daily.

## 2011-06-29 MED ORDER — METOPROLOL SUCCINATE ER 50 MG PO TB24: 50.0000 mg | ORAL_TABLET | Freq: Every day | ORAL | Status: DC

## 2011-07-05 ENCOUNTER — Other Ambulatory Visit: Payer: Self-pay | Admitting: Internal Medicine

## 2011-07-05 MED ORDER — PROPRANOLOL HCL ER 80 MG PO CP24: 80.0000 mg | ORAL_CAPSULE | Freq: Every day | ORAL | Status: DC

## 2011-07-05 NOTE — Telephone Encounter (Signed)
Refill - Propranolol ER (INDERAL LA) 80 MG 24 hr capsule  Verified preferred as [facility] [address].

## 2011-07-06 ENCOUNTER — Ambulatory Visit
Admission: RE | Admit: 2011-07-06 | Discharge: 2011-07-06 | Disposition: A | Discharge status: Home / Self Care | Payer: 59 | Source: Ambulatory Visit | Attending: Obstetrics and Gynecology | Admitting: Obstetrics and Gynecology

## 2011-07-06 ENCOUNTER — Other Ambulatory Visit: Payer: Commercial Indemnity

## 2011-07-06 DIAGNOSIS — D219 Benign neoplasm of connective and other soft tissue, unspecified: Secondary | ICD-10-CM

## 2011-07-25 ENCOUNTER — Emergency Department (HOSPITAL_COMMUNITY): Payer: PRIVATE HEALTH INSURANCE

## 2011-07-25 ENCOUNTER — Emergency Department (HOSPITAL_COMMUNITY)
Admission: EM | Admit: 2011-07-25 | Discharge: 2011-07-25 | Disposition: A | Discharge status: Home / Self Care | Payer: PRIVATE HEALTH INSURANCE | Attending: Emergency Medicine | Admitting: Emergency Medicine

## 2011-07-25 ENCOUNTER — Encounter (HOSPITAL_COMMUNITY): Payer: Self-pay | Admitting: *Deleted

## 2011-07-25 DIAGNOSIS — K219 Gastro-esophageal reflux disease without esophagitis: Secondary | ICD-10-CM | POA: Insufficient documentation

## 2011-07-25 DIAGNOSIS — X500XXA Overexertion from strenuous movement or load, initial encounter: Secondary | ICD-10-CM | POA: Insufficient documentation

## 2011-07-25 DIAGNOSIS — R609 Edema, unspecified: Secondary | ICD-10-CM | POA: Insufficient documentation

## 2011-07-25 DIAGNOSIS — IMO0002 Reserved for concepts with insufficient information to code with codable children: Secondary | ICD-10-CM | POA: Insufficient documentation

## 2011-07-25 DIAGNOSIS — S82409A Unspecified fracture of shaft of unspecified fibula, initial encounter for closed fracture: Secondary | ICD-10-CM

## 2011-07-25 DIAGNOSIS — S82899A Other fracture of unspecified lower leg, initial encounter for closed fracture: Secondary | ICD-10-CM | POA: Insufficient documentation

## 2011-07-25 DIAGNOSIS — Y9289 Other specified places as the place of occurrence of the external cause: Secondary | ICD-10-CM | POA: Insufficient documentation

## 2011-07-25 MED ORDER — HYDROCODONE-ACETAMINOPHEN 5-325 MG PO TABS: 1.0000 | ORAL_TABLET | ORAL | Status: DC | PRN

## 2011-07-25 MED ORDER — IBUPROFEN 800 MG PO TABS
800.0000 mg | ORAL_TABLET | Freq: Once | ORAL | Status: AC
  Administered 2011-07-25: 800 mg via ORAL
  Filled 2011-07-25: qty 1

## 2011-07-25 MED ORDER — IBUPROFEN 800 MG PO TABS: 800.0000 mg | ORAL_TABLET | Freq: Three times a day (TID) | ORAL | Status: DC | PRN

## 2011-07-25 NOTE — ED Notes (Signed)
First meeting with patient. Patient states she stepped across a chain in the parking lot this morning and tripped with right ankle twisted. Patient given additional ice pack and resting while waiting on xrays.

## 2011-07-25 NOTE — Discharge Instructions (Signed)
Fibular Fracture, Ankle, Adult, Treated with or without Immobilization  You have a fracture (break) of your fibula. This is the bone in your lower leg located on the outside of the leg. These fractures are easily diagnosed with x-rays.  TREATMENT   You have a simple fracture of the part of the fibula which is located between the knee and ankle. This bone usually will heal without problems and can often be treated without casting or splinting. This means the fracture will heal well during normal use and daily activities without being held in place. Sometimes a cast or splint is placed on these fractures if it is needed for comfort or if the bones are badly out of place.  HOME CARE INSTRUCTIONS    Apply ice to the injury for 15 to 20 minutes, 3 to 4 times per day while awake, for 2 days. Put the ice in a plastic bag and place a thin towel between the bag of ice and your leg. This helps keep swelling down.   Use crutches as directed. Resume walking without crutches as directed by your caregiver or when comfortable doing so.   Only take over-the-counter or prescription medicines for pain, discomfort, or fever as directed by your caregiver.   Keep appointments for follow up x-rays if these are required.   If you have a removable splint or boot, do not remove the boot unless directed by your caregiver.   Warning: Do not drive a car or operate a motor vehicle until your caregiver specifically tells you it is safe to do so.  SEEK MEDICAL CARE IF:    You have continued severe pain or more swelling   The medications do not control the pain.   Your skin or nails below the injury turn blue or grey or feel cold or numb.   You develop severe pain in the leg or foot.  MAKE SURE YOU:    Understand these instructions.   Will watch your condition.   Will get help right away if you are not doing well or get worse.  Document Released: 02/28/2005 Document Revised: 02/17/2011 Document Reviewed: 10/05/2007  [employer] Patient  Information 2012 [employer].

## 2011-07-25 NOTE — ED Notes (Signed)
Pt states walking into work, foot got caught on chain in parking lot. Pt right ankle twisted and is painful to touch.

## 2011-07-25 NOTE — ED Provider Notes (Signed)
History     CSN: [id number]  Arrival date & time 07/25/11  4098   First MD Initiated Contact with Patient 07/25/11 0703      Chief Complaint  Patient presents with  . Ankle Injury    (Consider location/radiation/quality/duration/timing/severity/associated sxs/prior treatment) Patient is a 46 y.o. female presenting with lower extremity injury.  Ankle Injury Associated symptoms include weakness. Pertinent negatives include no numbness.  Patient was walking into work this morning when she tripped over a chain on the ground in a parking lot and twisted her right ankle. Larey Seat to the ground and sustained an abrasion to the right knee. She was ambulatory after the fall but the pain has worsened and she can no longer bear weight. There is associated swelling of the ankle. She denies any weakness to the foot. Movement and attempted ambulation make her pain worse. No alleviating factors. Treatment prior to my assessment includes application of ice to the area.  Past Medical History  Diagnosis Date  . Chest pain     s/p normal cath Jan. 2009. NL EF  . GERD (gastroesophageal reflux disease)   . Palpitations     Past Surgical History  Procedure Date  . Laparoscopic cholecystectomy   . Lipoma resection     and redone  . Scar revision for the lipoma   . Dermoid cyst removal     History reviewed. No pertinent family history.  History  Substance Use Topics  . Smoking status: Never Smoker   . Smokeless tobacco: Never Used  . Alcohol Use: Yes     rare wine      Review of Systems  Musculoskeletal:       See HPI, otherwise negative  Skin:       See HPI, otherwise negative   Neurological: Positive for weakness. Negative for syncope and numbness.    Allergies  Doxycycline  Home Medications   Current Outpatient Rx  Name Route Sig Dispense Refill  . DEXLANSOPRAZOLE 60 MG PO CPDR Oral Take 60 mg by mouth daily.    Marland Kitchen ALIGN PO Oral Take 1 tablet by mouth 2 (two) times daily as  needed. For digestion    . PROPRANOLOL HCL ER 80 MG PO CP24 Oral Take 1 capsule (80 mg total) by mouth daily. 30 capsule 6  . TRIAMTERENE-HCTZ 37.5-25 MG PO TABS Oral Take 0.5 tablets by mouth daily.        BP 126/55  Pulse 65  Temp(Src) 97.6 F (36.4 C) (Oral)  Resp 16  SpO2 100%  Physical Exam  Constitutional: She appears well-developed and well-nourished. No distress.       Vital signs are reviewed and are normal.   HENT:  Head: Normocephalic and atraumatic.  Eyes: Pupils are equal, round, and reactive to light.  Neck: Neck supple.  Cardiovascular: Normal rate and regular rhythm.        Bilateral DP pulses 2+  Pulmonary/Chest: Effort normal. No respiratory distress.  Musculoskeletal: She exhibits edema and tenderness.       Right knee: She exhibits swelling (anteriolateral). She exhibits normal range of motion, no LCL laxity, normal patellar mobility and no MCL laxity. tenderness found. No medial joint line, no lateral joint line and no patellar tendon tenderness noted.       Right ankle: She exhibits decreased range of motion, swelling and ecchymosis. She exhibits no laceration and normal pulse. tenderness. Lateral malleolus, AITFL and proximal fibula tenderness found. No medial malleolus, no CF ligament, no posterior TFL and  no head of 5th metatarsal tenderness found.       Legs: Neurological: She is alert.       Sensation intact to light touch distal to injury on RLE. MS appears appropriate for pt and situation.  Skin: Skin is warm and dry.       3cm abrasion to anteriolateral right knee. Small amount of ecchymosis to lateral right ankle. No other wounds identified.  Psychiatric: She has a normal mood and affect.    ED Course  Procedures (including critical care time)  Labs Reviewed - No data to display Dg Ankle Complete Right  07/25/2011  *RADIOLOGY REPORT*  Clinical Data: Fall, ankle pain/swelling  RIGHT ANKLE - COMPLETE 3+ VIEW  Comparison: None.  Findings: Mildly  displaced oblique distal fibular fracture.  The ankle mortise is otherwise intact.  The base of the fifth metatarsal is unremarkable.  Mild lateral soft tissue swelling.  IMPRESSION: Mildly displaced distal fibular fracture.  Original Report Authenticated By: Charline Bills, M.D.   Dg Knee Complete 4 Views Right  07/25/2011  *RADIOLOGY REPORT*  Clinical Data: Fall.  Pain and swelling.  RIGHT KNEE - COMPLETE 4+ VIEW  Comparison: None.  Findings: There is a small amount of joint fluid.  There are mild degenerative changes in the medial lateral compartments with small marginal osteophytes.  On the lateral view, there is some flattening of the lateral femoral condyle.  This raises the possibility of an osteochondral fracture, but is not definitely abnormal.  IMPRESSION: Small joint fluid.  No definite acute finding.  Question mild flattening of the anterior surface of the lateral femoral condyle, which could be seen in a direct blow / osteochondral fracture. This is not a definite finding however.  Original Report Authenticated By: Thomasenia Sales, M.D.     1. Fibula fracture       MDM  Mechanical fall, ankle injury. Images of knee obtained due to proximal fibula tenderness. I personally viewed the images, reviewed radiologist interpretation. Distal fibula fx, questionable lateral femoral condyle fx(though examination does not correlate with this). Spoke with Dr Victorino Dike (ortho) regarding this pt at 8:10AM- he advises posterior splint with side stirrup and will see pt in office at 2pm. Pt notified of all radiology results and plan, voices understanding.        Shaaron Adler, [place] 07/25/11 [phone]

## 2011-07-25 NOTE — Progress Notes (Signed)
Orthopedic Tech Progress Note Patient Details:  Kimberly Boyd 10-24-1965 [id number]  Type of Splint: Short Leg;Stirrup Splint Interventions: Application    Cammer, Mickie Bail 07/25/2011, 8:46 AM

## 2011-07-25 NOTE — ED Provider Notes (Signed)
Medical screening examination/treatment/procedure(s) were performed by non-physician practitioner and as supervising physician I was immediately available for consultation/collaboration.   Loren Racer, MD 07/25/11 [phone]

## 2011-07-25 NOTE — Progress Notes (Signed)
Orthopedic Tech Progress Note Patient Details:  Kimberly Boyd 09-12-1965 [id number]  Other Ortho Devices Type of Ortho Device: Crutches   Shawnie Pons 07/25/2011, 8:47 AM

## 2011-08-04 ENCOUNTER — Encounter (HOSPITAL_BASED_OUTPATIENT_CLINIC_OR_DEPARTMENT_OTHER): Payer: Self-pay

## 2011-08-04 ENCOUNTER — Ambulatory Visit (HOSPITAL_BASED_OUTPATIENT_CLINIC_OR_DEPARTMENT_OTHER): Admit: 2011-08-04 | Payer: Self-pay | Admitting: Orthopedic Surgery

## 2011-08-04 SURGERY — OPEN REDUCTION INTERNAL FIXATION (ORIF) ANKLE FRACTURE
Anesthesia: General | Laterality: Right

## 2011-08-05 ENCOUNTER — Encounter (HOSPITAL_COMMUNITY)
Admission: RE | Admit: 2011-08-05 | Discharge: 2011-08-05 | Disposition: A | Discharge status: Home / Self Care | Payer: PRIVATE HEALTH INSURANCE | Source: Ambulatory Visit | Attending: Orthopedic Surgery | Admitting: Orthopedic Surgery

## 2011-08-05 ENCOUNTER — Encounter (HOSPITAL_COMMUNITY)
Admission: RE | Admit: 2011-08-05 | Discharge: 2011-08-05 | Disposition: A | Discharge status: Home / Self Care | Payer: PRIVATE HEALTH INSURANCE | Source: Ambulatory Visit | Attending: Anesthesiology | Admitting: Anesthesiology

## 2011-08-05 ENCOUNTER — Encounter (HOSPITAL_COMMUNITY): Payer: Self-pay | Admitting: *Deleted

## 2011-08-05 ENCOUNTER — Telehealth: Payer: Self-pay | Admitting: Internal Medicine

## 2011-08-05 ENCOUNTER — Encounter (HOSPITAL_COMMUNITY): Payer: Self-pay | Admitting: Pharmacy Technician

## 2011-08-05 LAB — BASIC METABOLIC PANEL
BUN: 11 mg/dL (ref 6–23)
CO2: 27 mEq/L (ref 19–32)
Calcium: 9.2 mg/dL (ref 8.4–10.5)
Chloride: 99 mEq/L (ref 96–112)
Creatinine, Ser: 0.67 mg/dL (ref 0.50–1.10)
GFR calc Af Amer: >90 mL/min (ref >90)
GFR calc non Af Amer: >90 mL/min (ref >90)
Glucose, Bld: 92 mg/dL (ref 70–99)
Potassium: 3.4 mEq/L — ABNORMAL LOW (ref 3.5–5.1)
Sodium: 137 mEq/L (ref 135–145)

## 2011-08-05 LAB — CBC
HCT: 33.6 % — ABNORMAL LOW (ref 36.0–46.0)
Hemoglobin: 11.2 g/dL — ABNORMAL LOW (ref 12.0–15.0)
MCH: 29.4 pg (ref 26.0–34.0)
MCHC: 33.3 g/dL (ref 30.0–36.0)
MCV: 88.2 fL (ref 78.0–100.0)
Platelets: 355 10*3/uL (ref 150–400)
RBC: 3.81 MIL/uL — ABNORMAL LOW (ref 3.87–5.11)
RDW: 13.5 % (ref 11.5–15.5)
WBC: 5.1 10*3/uL (ref 4.0–10.5)

## 2011-08-05 LAB — HCG, SERUM, QUALITATIVE: Preg, Serum: NEGATIVE

## 2011-08-05 LAB — SURGICAL PCR SCREEN
MRSA, PCR: NEGATIVE
Staphylococcus aureus: NEGATIVE

## 2011-08-05 NOTE — Consult Note (Signed)
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  History includes non-smoker, GERD, "questionable" murmur, HTN, SVT treated with adenosine (07/2010), normal cath 2009.    Anesthesia history includes tachycardia and difficult waking up after anesthesia.  She felt her anxiety contributed to her tachycardia with a prior surgery, and Dr. Victorino Dike has already prescribed her a Valium to take prior to getting to the hospital.  Her PAT appointment will be later today, 08/05/11.    Her Cardiologist is Dr. Graciela Husbands.  Her last visit was on 08/31/10 after treatment for SVT.  She was started (and remains) on B-blocker therapy.  Her EKG then showed NSR, possible LAE.   Cardiac cath on 04/10/07 showed, "no coronary anomalies, no evidence of spasm or bridging, no epicardial coronary disease."  EF 60%.  She has been doing well on b-blocker therapy and had a normal cath in 2009.  If her rhythm remains stable, I feel she can proceed without additional cardiac testing.  She knows to take her propranolol on the day of surgery.  Anesthesiologist Dr. Ivin Booty agrees with this plan.    Shonna Chock, PA-c

## 2011-08-05 NOTE — Telephone Encounter (Signed)
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

## 2011-08-05 NOTE — Pre-Procedure Instructions (Signed)
20 Kimberly Boyd  08/05/2011   Your procedure is scheduled on:  Tuesday May 28  Report to [facility] at 11:30 AM.  Call this number if you have problems the morning of surgery: [phone]   Remember:   Do not eat food:After Midnight.  May have clear liquids:until Midnight .  Clear liquids include soda, tea, black coffee, apple or grape juice, broth.  Take these medicines the morning of surgery with A SIP OF WATER: Propranolol, Dexilant   Do not wear jewelry, make-up or nail polish.  Do not wear lotions, powders, or perfumes. You may wear deodorant.  Do not shave 48 hours prior to surgery. Men may shave face and neck.  Do not bring valuables to the hospital.  Contacts, dentures or bridgework may not be worn into surgery.  Leave suitcase in the car. After surgery it may be brought to your room.  For patients admitted to the hospital, checkout time is 11:00 AM the day of discharge.   Patients discharged the day of surgery will not be allowed to drive home.  Name and phone number of your driver: Casimiro Needle [phone]   Special Instructions: CHG Shower Use Special Wash: 1/2 bottle night before surgery and 1/2 bottle morning of surgery.   Please read over the following fact sheets that you were given: Pain Booklet, Coughing and Deep Breathing and Surgical Site Infection Prevention

## 2011-08-05 NOTE — Progress Notes (Signed)
This morning left message for [employer] in Dr. Laverta Baltimore office requesting orders.

## 2011-08-05 NOTE — Telephone Encounter (Signed)
Pt broke ankle two weeks ago, needs surgery, unsure if clearance will be needed. Pt is a Engineer, civil (consulting) at [employer]. Told her I would send an FYI but if Anesthesia PA needs clearance she could speak with [facility] hospital DOD, she agreed and new it would be taken care of.

## 2011-08-09 ENCOUNTER — Encounter (HOSPITAL_COMMUNITY): Payer: Self-pay | Admitting: Anesthesiology

## 2011-08-09 ENCOUNTER — Ambulatory Visit (HOSPITAL_COMMUNITY): Payer: PRIVATE HEALTH INSURANCE

## 2011-08-09 ENCOUNTER — Encounter (HOSPITAL_COMMUNITY): Payer: Self-pay | Admitting: *Deleted

## 2011-08-09 ENCOUNTER — Ambulatory Visit (HOSPITAL_COMMUNITY)
Admission: RE | Admit: 2011-08-09 | Discharge: 2011-08-09 | Disposition: A | Discharge status: Home / Self Care | Payer: PRIVATE HEALTH INSURANCE | Source: Ambulatory Visit | Attending: Orthopedic Surgery | Admitting: Orthopedic Surgery

## 2011-08-09 ENCOUNTER — Encounter (HOSPITAL_COMMUNITY): Payer: Self-pay | Admitting: Vascular Surgery

## 2011-08-09 ENCOUNTER — Ambulatory Visit (HOSPITAL_COMMUNITY): Payer: PRIVATE HEALTH INSURANCE | Admitting: Vascular Surgery

## 2011-08-09 ENCOUNTER — Encounter (HOSPITAL_COMMUNITY)
Admission: RE | Disposition: A | Discharge status: Home / Self Care | Payer: Self-pay | Source: Ambulatory Visit | Attending: Orthopedic Surgery

## 2011-08-09 DIAGNOSIS — M545 Low back pain, unspecified: Secondary | ICD-10-CM | POA: Insufficient documentation

## 2011-08-09 DIAGNOSIS — H6690 Otitis media, unspecified, unspecified ear: Secondary | ICD-10-CM | POA: Insufficient documentation

## 2011-08-09 DIAGNOSIS — K219 Gastro-esophageal reflux disease without esophagitis: Secondary | ICD-10-CM | POA: Insufficient documentation

## 2011-08-09 DIAGNOSIS — I498 Other specified cardiac arrhythmias: Secondary | ICD-10-CM | POA: Insufficient documentation

## 2011-08-09 DIAGNOSIS — Y990 Civilian activity done for income or pay: Secondary | ICD-10-CM | POA: Insufficient documentation

## 2011-08-09 DIAGNOSIS — I10 Essential (primary) hypertension: Secondary | ICD-10-CM | POA: Insufficient documentation

## 2011-08-09 DIAGNOSIS — Y9289 Other specified places as the place of occurrence of the external cause: Secondary | ICD-10-CM | POA: Insufficient documentation

## 2011-08-09 DIAGNOSIS — W19XXXA Unspecified fall, initial encounter: Secondary | ICD-10-CM | POA: Insufficient documentation

## 2011-08-09 DIAGNOSIS — Z01812 Encounter for preprocedural laboratory examination: Secondary | ICD-10-CM | POA: Insufficient documentation

## 2011-08-09 DIAGNOSIS — S8263XA Displaced fracture of lateral malleolus of unspecified fibula, initial encounter for closed fracture: Secondary | ICD-10-CM | POA: Insufficient documentation

## 2011-08-09 HISTORY — DX: Essential (primary) hypertension: I10

## 2011-08-09 HISTORY — DX: Other complications of anesthesia, initial encounter: T88.59XA

## 2011-08-09 HISTORY — DX: Cardiac murmur, unspecified: R01.1

## 2011-08-09 HISTORY — DX: Adverse effect of unspecified anesthetic, initial encounter: T41.45XA

## 2011-08-09 HISTORY — DX: Dorsalgia, unspecified: M54.9

## 2011-08-09 HISTORY — PX: ORIF ANKLE FRACTURE: SHX5408

## 2011-08-09 SURGERY — OPEN REDUCTION INTERNAL FIXATION (ORIF) ANKLE FRACTURE
Anesthesia: General | Site: Ankle | Laterality: Right | Wound class: Clean

## 2011-08-09 MED ORDER — EPHEDRINE SULFATE 50 MG/ML IJ SOLN
INTRAMUSCULAR | Status: DC | PRN
  Administered 2011-08-09 (×3): 10 mg via INTRAVENOUS

## 2011-08-09 MED ORDER — OXYCODONE HCL 5 MG PO TABS: 5.0000 mg | ORAL_TABLET | ORAL | Status: AC | PRN

## 2011-08-09 MED ORDER — DEXAMETHASONE SODIUM PHOSPHATE 4 MG/ML IJ SOLN
INTRAMUSCULAR | Status: DC | PRN
  Administered 2011-08-09: 4 mg
  Administered 2011-08-09: 4 mg via INTRAVENOUS

## 2011-08-09 MED ORDER — LORAZEPAM 2 MG/ML IJ SOLN: 1.0000 mg | Freq: Once | INTRAMUSCULAR | Status: DC | PRN

## 2011-08-09 MED ORDER — FENTANYL CITRATE 0.05 MG/ML IJ SOLN
50.0000 ug | INTRAMUSCULAR | Status: DC | PRN
  Administered 2011-08-09: 100 ug via INTRAVENOUS

## 2011-08-09 MED ORDER — MIDAZOLAM HCL 2 MG/2ML IJ SOLN
INTRAMUSCULAR | Status: AC
  Filled 2011-08-09: qty 2

## 2011-08-09 MED ORDER — LACTATED RINGERS IV SOLN
INTRAVENOUS | Status: DC
  Administered 2011-08-09: 13:00:00 via INTRAVENOUS

## 2011-08-09 MED ORDER — CEFAZOLIN SODIUM 1-5 GM-% IV SOLN
INTRAVENOUS | Status: DC | PRN
  Administered 2011-08-09: 2 g via INTRAVENOUS

## 2011-08-09 MED ORDER — HYDROMORPHONE HCL PF 1 MG/ML IJ SOLN: 0.2500 mg | INTRAMUSCULAR | Status: DC | PRN

## 2011-08-09 MED ORDER — PROPOFOL 10 MG/ML IV EMUL
INTRAVENOUS | Status: DC | PRN
  Administered 2011-08-09: 150 mg via INTRAVENOUS

## 2011-08-09 MED ORDER — LIDOCAINE HCL 1 % IJ SOLN
INTRAMUSCULAR | Status: DC | PRN
  Administered 2011-08-09: 60 mg via INTRADERMAL

## 2011-08-09 MED ORDER — FENTANYL CITRATE 0.05 MG/ML IJ SOLN
INTRAMUSCULAR | Status: AC
  Filled 2011-08-09: qty 2

## 2011-08-09 MED ORDER — 0.9 % SODIUM CHLORIDE (POUR BTL) OPTIME
TOPICAL | Status: DC | PRN
  Administered 2011-08-09: 1000 mL

## 2011-08-09 MED ORDER — LACTATED RINGERS IV SOLN
INTRAVENOUS | Status: DC | PRN
  Administered 2011-08-09 (×2): via INTRAVENOUS

## 2011-08-09 MED ORDER — ACETAMINOPHEN 10 MG/ML IV SOLN
INTRAVENOUS | Status: DC | PRN
  Administered 2011-08-09: 1000 mg via INTRAVENOUS

## 2011-08-09 MED ORDER — MIDAZOLAM HCL 2 MG/2ML IJ SOLN
1.0000 mg | INTRAMUSCULAR | Status: DC | PRN
  Administered 2011-08-09: 2 mg via INTRAVENOUS

## 2011-08-09 MED ORDER — ONDANSETRON HCL 4 MG/2ML IJ SOLN
INTRAMUSCULAR | Status: DC | PRN
  Administered 2011-08-09 (×2): 4 mg via INTRAVENOUS

## 2011-08-09 MED ORDER — FENTANYL CITRATE 0.05 MG/ML IJ SOLN
INTRAMUSCULAR | Status: DC | PRN
  Administered 2011-08-09: 100 ug via INTRAVENOUS
  Administered 2011-08-09: 50 ug via INTRAVENOUS

## 2011-08-09 MED ORDER — BACITRACIN ZINC 500 UNIT/GM EX OINT
TOPICAL_OINTMENT | CUTANEOUS | Status: DC | PRN
  Administered 2011-08-09: 1 via TOPICAL

## 2011-08-09 MED ORDER — ACETAMINOPHEN 10 MG/ML IV SOLN
INTRAVENOUS | Status: AC
  Filled 2011-08-09: qty 100

## 2011-08-09 MED ORDER — BUPIVACAINE HCL (PF) 0.5 % IJ SOLN
INTRAMUSCULAR | Status: DC | PRN
  Administered 2011-08-09: 50 mL

## 2011-08-09 MED ORDER — CEFAZOLIN SODIUM 1-5 GM-% IV SOLN
INTRAVENOUS | Status: AC
  Filled 2011-08-09: qty 100

## 2011-08-09 SURGICAL SUPPLY — 67 items
3.8 x 10 cancellous screw (Screw) ×2 IMPLANT
3.8 x 12 cancellous screw (Screw) ×2 IMPLANT
308 x 14 cancellous screw (Screw) ×2 IMPLANT
6 hole sidewinder plate ×2 IMPLANT
BANDAGE ESMARK 6X9 LF (GAUZE/BANDAGES/DRESSINGS) ×1 IMPLANT
BLADE SURG 15 STRL LF DISP TIS (BLADE) ×1 IMPLANT
BLADE SURG 15 STRL SS (BLADE) ×1
BNDG COHESIVE 4X5 TAN STRL (GAUZE/BANDAGES/DRESSINGS) ×2 IMPLANT
BNDG COHESIVE 6X5 TAN STRL LF (GAUZE/BANDAGES/DRESSINGS) ×2 IMPLANT
BNDG ESMARK 6X9 LF (GAUZE/BANDAGES/DRESSINGS) ×2
CHLORAPREP W/TINT 26ML (MISCELLANEOUS) ×2 IMPLANT
CLOTH BEACON ORANGE TIMEOUT ST (SAFETY) ×2 IMPLANT
COVER SURGICAL LIGHT HANDLE (MISCELLANEOUS) ×2 IMPLANT
CUFF TOURNIQUET SINGLE 34IN LL (TOURNIQUET CUFF) ×2 IMPLANT
CUFF TOURNIQUET SINGLE 44IN (TOURNIQUET CUFF) IMPLANT
Cortical screw 3.2 x 10 (Screw) ×4 IMPLANT
DRAPE C-ARM 42X72 X-RAY (DRAPES) ×2 IMPLANT
DRAPE C-ARMOR (DRAPES) IMPLANT
DRAPE INCISE IOBAN 66X45 STRL (DRAPES) ×2 IMPLANT
DRAPE ORTHO SPLIT 77X108 STRL (DRAPES) ×1
DRAPE SURG ORHT 6 SPLT 77X108 (DRAPES) ×1 IMPLANT
DRAPE U-SHAPE 47X51 STRL (DRAPES) ×2 IMPLANT
DRILL BIT TRIMED ×2 IMPLANT
DRSG ADAPTIC 3X8 NADH LF (GAUZE/BANDAGES/DRESSINGS) ×2 IMPLANT
DRSG PAD ABDOMINAL 8X10 ST (GAUZE/BANDAGES/DRESSINGS) ×4 IMPLANT
ELECT REM PT RETURN 9FT ADLT (ELECTROSURGICAL) ×2
ELECTRODE REM PT RTRN 9FT ADLT (ELECTROSURGICAL) ×1 IMPLANT
GLOVE BIO SURGEON STRL SZ8 (GLOVE) ×2 IMPLANT
GLOVE BIOGEL PI IND STRL 7.0 (GLOVE) ×1 IMPLANT
GLOVE BIOGEL PI IND STRL 8 (GLOVE) ×1 IMPLANT
GLOVE BIOGEL PI IND STRL 8.5 (GLOVE) ×1 IMPLANT
GLOVE BIOGEL PI INDICATOR 7.0 (GLOVE) ×1
GLOVE BIOGEL PI INDICATOR 8 (GLOVE) ×1
GLOVE BIOGEL PI INDICATOR 8.5 (GLOVE) ×1
GLOVE ECLIPSE 8.0 STRL XLNG CF (GLOVE) ×2 IMPLANT
GLOVE EXAM NITRILE XL STR (GLOVE) ×2 IMPLANT
GLOVE SURG SS PI 7.0 STRL IVOR (GLOVE) ×2 IMPLANT
GLOVE SURG SS PI 8.5 STRL IVOR (GLOVE) ×1
GLOVE SURG SS PI 8.5 STRL STRW (GLOVE) ×1 IMPLANT
GOWN PREVENTION PLUS XLARGE (GOWN DISPOSABLE) IMPLANT
GOWN STRL NON-REIN LRG LVL3 (GOWN DISPOSABLE) IMPLANT
KIT BASIN OR (CUSTOM PROCEDURE TRAY) ×2 IMPLANT
KIT ROOM TURNOVER OR (KITS) ×2 IMPLANT
MANIFOLD NEPTUNE II (INSTRUMENTS) ×2 IMPLANT
NEEDLE 22X1 1/2 (OR ONLY) (NEEDLE) IMPLANT
NS IRRIG 1000ML POUR BTL (IV SOLUTION) ×2 IMPLANT
PACK ORTHO EXTREMITY (CUSTOM PROCEDURE TRAY) ×2 IMPLANT
PAD ARMBOARD 7.5X6 YLW CONV (MISCELLANEOUS) ×4 IMPLANT
PAD CAST 4YDX4 CTTN HI CHSV (CAST SUPPLIES) ×1 IMPLANT
PADDING CAST COTTON 4X4 STRL (CAST SUPPLIES) ×1
PADDING CAST COTTON 6X4 STRL (CAST SUPPLIES) ×4 IMPLANT
SPONGE GAUZE 4X4 12PLY (GAUZE/BANDAGES/DRESSINGS) ×4 IMPLANT
SPONGE LAP 18X18 X RAY DECT (DISPOSABLE) ×2 IMPLANT
STAPLER VISISTAT 35W (STAPLE) IMPLANT
SUCTION FRAZIER TIP 10 FR DISP (SUCTIONS) ×2 IMPLANT
SUT MNCRL AB 3-0 PS2 18 (SUTURE) ×2 IMPLANT
SUT PROLENE 3 0 PS 2 (SUTURE) ×2 IMPLANT
SUT VIC AB 2-0 CT1 27 (SUTURE) ×1
SUT VIC AB 2-0 CT1 TAPERPNT 27 (SUTURE) ×1 IMPLANT
SUT VIC AB 3-0 PS2 18 (SUTURE)
SUT VIC AB 3-0 PS2 18XBRD (SUTURE) IMPLANT
SYR CONTROL 10ML LL (SYRINGE) IMPLANT
TOWEL OR 17X24 6PK STRL BLUE (TOWEL DISPOSABLE) IMPLANT
TOWEL OR 17X26 10 PK STRL BLUE (TOWEL DISPOSABLE) ×2 IMPLANT
TUBE CONNECTING 12X1/4 (SUCTIONS) ×2 IMPLANT
WATER STERILE IRR 1000ML POUR (IV SOLUTION) IMPLANT
cortical screw 3.2 x 12 (Screw) ×2 IMPLANT

## 2011-08-09 NOTE — Anesthesia Preprocedure Evaluation (Addendum)
Anesthesia Evaluation  Patient identified by MRN, date of birth, ID band Patient awake    Reviewed: Allergy & Precautions, H&P , NPO status , Patient's Chart, lab work & pertinent test results  Airway Mallampati: I TM Distance: >3 FB Neck ROM: Full    Dental  (+) Teeth Intact and Dental Advisory Given   Pulmonary neg pulmonary ROS,    Pulmonary exam normal       Cardiovascular hypertension, Pt. on medications and Pt. on home beta blockers + dysrhythmias Supra Ventricular Tachycardia + Valvular Problems/Murmurs     Neuro/Psych negative neurological ROS  negative psych ROS   GI/Hepatic Neg liver ROS, GERD-  Medicated and Controlled,  Endo/Other    Renal/GU   negative genitourinary   Musculoskeletal   Abdominal (+) + obese,   Peds  Hematology negative hematology ROS (+)   Anesthesia Other Findings   Reproductive/Obstetrics negative OB ROS                          Anesthesia Physical Anesthesia Plan  ASA: II  Anesthesia Plan: General   Post-op Pain Management:    Induction: Intravenous  Airway Management Planned: LMA  Additional Equipment:   Intra-op Plan:   Post-operative Plan: Extubation in OR  Informed Consent: I have reviewed the patients History and Physical, chart, labs and discussed the procedure including the risks, benefits and alternatives for the proposed anesthesia with the patient or authorized representative who has indicated his/her understanding and acceptance.     Plan Discussed with: CRNA and Surgeon  Anesthesia Plan Comments:         Anesthesia Quick Evaluation

## 2011-08-09 NOTE — Transfer of Care (Signed)
Immediate Anesthesia Transfer of Care Note  Patient: Kimberly Boyd  Procedure(s) Performed: Procedure(s) (LRB): OPEN REDUCTION INTERNAL FIXATION (ORIF) ANKLE FRACTURE (Right)  Patient Location: PACU  Anesthesia Type: GA combined with regional for post-op pain  Level of Consciousness: awake, alert , oriented and patient cooperative  Airway & Oxygen Therapy: Patient Spontanous Breathing and Patient connected to nasal cannula oxygen  Post-op Assessment: Report given to PACU RN and Post -op Vital signs reviewed and stable  Post vital signs: Reviewed and stable  Complications: No apparent anesthesia complications

## 2011-08-09 NOTE — Progress Notes (Signed)
Orthopedic Tech Progress Note Patient Details:  Kimberly Boyd 1965-10-04 [id number]  Ortho Devices Type of Ortho Device: Postop boot Ortho Device/Splint Interventions: Application   Cammer, Mickie Bail 08/09/2011, 5:12 PM

## 2011-08-09 NOTE — Progress Notes (Signed)
SPOKE WITH SHERRY IN DR HEWITS OFFICE... ADVISED THAT NO ORDERS WERE IN THE EPIC... SHE  IS AWARE BUT  DR. HEWITT HAS TO DO THAT  HIMSELF.

## 2011-08-09 NOTE — Discharge Instructions (Signed)
Kimberly Dorfman, MD °[facility] ° °Please read the following information regarding your care after surgery. ° °Medications  °You only need a prescription for the narcotic pain medicine (ex. oxycodone, Percocet, Norco).  All of the other medicines listed below are available over the counter. °X acetominophen (Tylenol) 650 mg every 4-6 hours as you need for minor pain °X oxycodone as prescribed for moderate to severe pain °?  ° °Narcotic pain medicine (ex. oxycodone, Percocet, Vicodin) will cause constipation.  To prevent this problem, take the following medicines while you are taking any pain medicine. °X docusate sodium (Colace) 100 mg twice a day X senna (Senokot) 2 tablets twice a day ° °X To help prevent blood clots, take an aspirin (325 mg) once a day for a month after surgery.  You should also get up every hour while you are awake to move around.   ° °Weight Bearing °? Bear weight when you are able on your operated leg or foot. °? Bear weight only on the heel of your operated foot in the post-op shoe. °X Do not bear any weight on the operated leg or foot. ° °Cast / Splint / Dressing °X Keep your splint or cast clean and dry.  Don’t put anything (coat hanger, pencil, etc) down inside of it.  If it gets damp, use a hair dryer on the cool setting to dry it.  If it gets soaked, call the office to schedule an appointment for a cast change. °? Remove your dressing 3 days after surgery and cover the incisions with dry dressings.   ° °After your dressing, cast or splint is removed; you may shower, but do not soak or scrub the wound.  Allow the water to run over it, and then gently pat it dry. ° °Swelling °It is normal for you to have swelling where you had surgery.  To reduce swelling and pain, keep your toes above your nose for at least 3 days after surgery.  It may be necessary to keep your foot or leg elevated for several weeks.  If it hurts, it should be elevated. ° °Follow Up °Call my office at  [phone] when you are discharged from the hospital or surgery center to schedule an appointment to be seen two weeks after surgery. ° °Call my office at [phone] if you develop a fever >101.5° F, nausea, vomiting, bleeding from the surgical site or severe pain.   ° ° °

## 2011-08-09 NOTE — Anesthesia Procedure Notes (Addendum)
Anesthesia Regional Block:  Popliteal block  Pre-Anesthetic Checklist: ,, timeout performed, Correct Patient, Correct Site, Correct Laterality, Correct Procedure, Correct Position, site marked, Risks and benefits discussed,  Surgical consent,  Pre-op evaluation,  At surgeon's request and post-op pain management  Laterality: Right  Prep: chloraprep       Needles:  Injection technique: Single-shot  Needle Type: Stimulator Needle - 80     Needle Length: 8cm  Needle Gauge: 22 and 22 G    Additional Needles:  Procedures: nerve stimulator Popliteal block  Nerve Stimulator or Paresthesia:  Response: 0.48 mA,   Additional Responses:   Narrative:  Start time: 08/09/2011 1:14 PM End time: 08/09/2011 1:22 PM Injection made incrementally with aspirations every 5 mL. Anesthesiologist: Dr Gypsy Balsam  Additional Notes: [id number] R Pop Nerve Block POP IV sed CHG prep, sterile tech #[id number] stim needle w/stim down to .48ma with lateral approach Marc .5% total 50cc+decadron 4mg  infiltration Multiple neg asp No compl Dr Gypsy Balsam   Procedure Name: LMA Insertion Date/Time: 08/09/2011 1:56 PM Performed by: Leona Singleton A Pre-anesthesia Checklist: Patient identified, Emergency Drugs available, Suction available, Patient being monitored and Timeout performed Patient Re-evaluated:Patient Re-evaluated prior to inductionOxygen Delivery Method: Circle system utilized Preoxygenation: Pre-oxygenation with 100% oxygen Intubation Type: IV induction LMA: LMA inserted LMA Size: 3.0 Number of attempts: 1 Placement Confirmation: positive ETCO2 and breath sounds checked- equal and bilateral Tube secured with: Tape Dental Injury: Teeth and Oropharynx as per pre-operative assessment

## 2011-08-09 NOTE — H&P (Signed)
Kimberly Boyd is an 46 y.o. female.   Chief Complaint: right ankle fracture HPI:  46 y/o female with fall at work last week injuring her right ankle.  xrays show a displaced lateral malleolus fracture.  She presents now for operative treatment.  Past Medical History  Diagnosis Date  . GERD (gastroesophageal reflux disease)   . Palpitations   . Complication of anesthesia     tachycardia, difficulty waking up  . Heart murmur     questionable  . Hypertension   . Back pain years.     chronic low back pain   . Palpitations 2011    takes  propanolol     Past Surgical History  Procedure Date  . Laparoscopic cholecystectomy   . Lipoma resection     and redone  . Scar revision for the lipoma   . Dermoid cyst removal   . Cervical dysplasia laser     History reviewed. No pertinent family history. Social History:  reports that she has never smoked. She has never used smokeless tobacco. She reports that she drinks alcohol. She reports that she does not use illicit drugs.  Allergies:  Allergies  Allergen Reactions  . Doxycycline Other (See Comments)    Esophagitis; opened up in my esophagus    Medications Prior to Admission  Medication Sig Dispense Refill  . aspirin 81 MG tablet Take 81 mg by mouth daily.      Marland Kitchen dexlansoprazole (DEXILANT) 60 MG capsule Take 60 mg by mouth daily.      . Probiotic Product (ALIGN PO) Take 1 tablet by mouth daily as needed. For digestion      . propranolol ER (INDERAL LA) 80 MG 24 hr capsule Take 1 capsule (80 mg total) by mouth daily.  30 capsule  6  . traMADol (ULTRAM) 50 MG tablet Take 50 mg by mouth every 6 (six) hours as needed. As needed for pain.      Marland Kitchen triamterene-hydrochlorothiazide (MAXZIDE-25) 37.5-25 MG per tablet Take 0.5 tablets by mouth daily.          No results found for this or any previous visit (from the past 48 hour(s)). No results found.  ROS  No recent f/c/n/v/wt loss.  Blood pressure 125/80, pulse 81, temperature 99.1 F  (37.3 C), temperature source Oral, resp. rate 18, last menstrual period 08/05/2011, SpO2 100.00%. Physical Exam wn wd female in nad.  A and O x 4.  Mood and affect normal.  EOMI.  Respirations unlabored.  R ankle with minimal sweeling.  Skin intact.  5/5 strength in PF and DF at ankle.  Feels LT normally through foot.  Assessment/Plan R lateral malleolus fracture - to OR for ORIF.  The risks and benefits of the alternative treatment options have been discussed in detail.  The patient wishes to proceed with surgery and specifically understands risks of bleeding, infection, nerve damage, blood clots, need for additional surgery, amputation and death.   Orenthal Debski 08-22-11, 1:40 PM

## 2011-08-09 NOTE — Brief Op Note (Signed)
08/09/2011  2:56 PM  PATIENT:  Kimberly Boyd  46 y.o. female  PRE-OPERATIVE DIAGNOSIS:  right ankle lateral malleous fracture   POST-OPERATIVE DIAGNOSIS:  right ankle lateral malleous fracture  Procedure(s): 1.  OPEN REDUCTION INTERNAL FIXATION right ankle lateral malleolus fracture 2.  Fluoro 3.  Stress exam of right ankle   SURGEON:  Toni Arthurs, MD  ASSISTANT: n/a  ANESTHESIA:   General, regional  EBL:  minimal   TOURNIQUET:   Total Tourniquet Time Documented: Thigh (Right) - 32 minutes  COMPLICATIONS:  None apparent  DISPOSITION:  Extubated, awake and stable to recovery.  DICTATION ID:  [id number]

## 2011-08-09 NOTE — Op Note (Signed)
NAMEORAH, SONNEN                ACCOUNT NO.:  [id number]  MEDICAL RECORD NO.:  [id number]  LOCATION:  [facility]                         FACILITY:  [facility]  PHYSICIAN:  Toni Arthurs, MD        DATE OF BIRTH:  [birth year]  DATE OF PROCEDURE:  08/09/2011 DATE OF DISCHARGE:  08/09/2011                              OPERATIVE REPORT   PREOPERATIVE DIAGNOSIS:  Right ankle lateral malleolus fracture.  POSTOPERATIVE DIAGNOSIS:  Right ankle lateral malleolus fracture.  PROCEDURE: 1. Open reduction and internal fixation of right ankle lateral     malleolus fracture. 2. Intraoperative interpretation of fluoroscopic imaging. 3. Stress examination of right ankle under fluoroscopy.  SURGEON:  Toni Arthurs, MD  ANESTHESIA:  General, regional.  ESTIMATED BLOOD LOSS:  Minimal.  TOURNIQUET TIME:  32 minutes at 225 mmHg.  COMPLICATIONS:  None apparent.  DISPOSITION:  Extubated awake and stable to recovery.  INDICATIONS FOR PROCEDURE:  The patient is a 46 year old female without significant past medical history.  She fell in the parking deck at work, injuring her right ankle approximately 10 days ago.  She presents now for operative treatment of this displaced unstable right ankle fracture. She understands the risks and benefits, the alternative treatment options and elects surgical treatment.  She specifically understands risks of bleeding, infection, nerve damage, blood clots, need for additional surgery, amputation, and death.  PROCEDURE IN DETAIL:  After preoperative consent was obtained and the correct operative site was identified, the patient was brought to the operating room and placed supine on the operating table.  General anesthesia was induced.  Preoperative antibiotics were administered. Surgical time-out was taken.  The right lower extremity was prepped and draped in standard sterile fashion with the tourniquet around the thigh. The extremity was exsanguinated.  The tourniquet was  inflated to 225 mmHg.  A longitudinal incision was made over the lateral malleolus. Sharp dissection was carried down through the skin and subcutaneous tissue.  The periosteum was incised and elevated over the fracture site. The fracture site was reduced and held with a lobster claw.  A 6-hole medium TriMed Sidewinder plate was placed on the lateral aspect of the fibula.  It was fixed proximally and distally in the oblong holes with bicortical screw proximally and unicortical screw distally.  The distraction device was then applied and used to reduce the fracture to its appropriate length.  Two bicortical screws were inserted proximally and 2 more unicortical screws were inserted distally.  The screws were inserted after crimping the tabs anteriorly and posteriorly on the plate compressing the fracture site.  AP mortise and lateral views were obtained showing appropriate reduction of the fracture site and appropriate position and length of all hardware.  A mortise view was then obtained and under live fluoroscopy, dorsiflexion of external rotation stress was applied.  There was no widening noted at the medial clear space or the syndesmosis.  The wound was then irrigated copiously. Inverted simple sutures of 3-0 Monocryl were used to close the periosteum over the plate.  Inverted simple sutures of 3-0 Monocryl were used to close subcutaneous tissue and a running 3-0 Prolene suture was used to close the skin  incision.  Sterile dressings were applied followed by a compression wrap and a well-padded short-leg splint.  The tourniquet was released at 32 minutes after application of the dressings.  The patient was then awakened from anesthesia and transported to the recovery room in stable condition.  FOLLOWUP PLAN:  The patient should be nonweightbearing on her right lower extremity.  She will follow up with me in 2 weeks for suture removal and conversion to a cast.     Toni Arthurs,  MD     JH/MEDQ  D:  08/09/2011  T:  08/09/2011  Job:  [id number]

## 2011-08-09 NOTE — Preoperative (Signed)
Beta Blockers   Reason not to administer Beta Blockers:08/09/11 0720

## 2011-08-10 ENCOUNTER — Encounter (HOSPITAL_COMMUNITY): Payer: Self-pay | Admitting: Orthopedic Surgery

## 2011-08-15 NOTE — Anesthesia Postprocedure Evaluation (Signed)
  Anesthesia Post-op Note  Patient: Kimberly Boyd  Procedure(s) Performed: Procedure(s) (LRB): OPEN REDUCTION INTERNAL FIXATION (ORIF) ANKLE FRACTURE (Right)  Patient Location: PACU  Anesthesia Type: General  Level of Consciousness: awake  Airway and Oxygen Therapy: Patient Spontanous Breathing  Post-op Pain: mild  Post-op Assessment: Patient's Cardiovascular Status Stable and Respiratory Function Stable  Post-op Vital Signs: Reviewed and stable  Complications: No apparent anesthesia complications

## 2011-10-13 ENCOUNTER — Telehealth: Payer: Self-pay | Admitting: Radiology

## 2011-10-13 NOTE — Telephone Encounter (Signed)
Left message for 3 mo f/u call to check w/ pt re:  Status of menorrhagia.  Requested that pt call for update of status.

## 2012-01-25 ENCOUNTER — Encounter (HOSPITAL_COMMUNITY): Payer: Self-pay

## 2012-02-08 ENCOUNTER — Telehealth: Payer: Self-pay | Admitting: Internal Medicine

## 2012-02-08 MED ORDER — PROPRANOLOL HCL ER 80 MG PO CP24: 80.0000 mg | ORAL_CAPSULE | Freq: Every day | ORAL | Status: DC

## 2012-02-08 NOTE — Telephone Encounter (Signed)
Propanalol, refill needed, pharmacy faxed request three times,  [facility] patient [facility]

## 2012-07-04 ENCOUNTER — Other Ambulatory Visit: Payer: Self-pay

## 2012-07-04 DIAGNOSIS — Z1231 Encounter for screening mammogram for malignant neoplasm of breast: Secondary | ICD-10-CM

## 2012-07-18 ENCOUNTER — Ambulatory Visit: Payer: Self-pay

## 2012-07-27 ENCOUNTER — Encounter (HOSPITAL_BASED_OUTPATIENT_CLINIC_OR_DEPARTMENT_OTHER): Payer: Self-pay | Admitting: *Deleted

## 2012-07-27 ENCOUNTER — Emergency Department (HOSPITAL_BASED_OUTPATIENT_CLINIC_OR_DEPARTMENT_OTHER): Payer: 59

## 2012-07-27 ENCOUNTER — Emergency Department (HOSPITAL_BASED_OUTPATIENT_CLINIC_OR_DEPARTMENT_OTHER)
Admission: EM | Admit: 2012-07-27 | Discharge: 2012-07-28 | Disposition: A | Discharge status: Home / Self Care | Payer: 59 | Attending: Emergency Medicine | Admitting: Emergency Medicine

## 2012-07-27 DIAGNOSIS — Z3202 Encounter for pregnancy test, result negative: Secondary | ICD-10-CM | POA: Insufficient documentation

## 2012-07-27 DIAGNOSIS — Z8679 Personal history of other diseases of the circulatory system: Secondary | ICD-10-CM | POA: Insufficient documentation

## 2012-07-27 DIAGNOSIS — K5900 Constipation, unspecified: Secondary | ICD-10-CM

## 2012-07-27 DIAGNOSIS — Z7982 Long term (current) use of aspirin: Secondary | ICD-10-CM | POA: Insufficient documentation

## 2012-07-27 DIAGNOSIS — K219 Gastro-esophageal reflux disease without esophagitis: Secondary | ICD-10-CM

## 2012-07-27 DIAGNOSIS — M545 Low back pain, unspecified: Secondary | ICD-10-CM | POA: Insufficient documentation

## 2012-07-27 DIAGNOSIS — R109 Unspecified abdominal pain: Secondary | ICD-10-CM

## 2012-07-27 DIAGNOSIS — Z79899 Other long term (current) drug therapy: Secondary | ICD-10-CM | POA: Insufficient documentation

## 2012-07-27 DIAGNOSIS — I10 Essential (primary) hypertension: Secondary | ICD-10-CM

## 2012-07-27 DIAGNOSIS — G8929 Other chronic pain: Secondary | ICD-10-CM | POA: Insufficient documentation

## 2012-07-27 LAB — CBC WITH DIFFERENTIAL/PLATELET
Basophils Absolute: 0 10*3/uL (ref 0.0–0.1)
Basophils Relative: 0 % (ref 0–1)
Eosinophils Absolute: 0.1 10*3/uL (ref 0.0–0.7)
Eosinophils Relative: 1 % (ref 0–5)
HCT: 30.5 % — ABNORMAL LOW (ref 36.0–46.0)
Hemoglobin: 10.3 g/dL — ABNORMAL LOW (ref 12.0–15.0)
Lymphocytes Relative: 32 % (ref 12–46)
Lymphs Abs: 2.4 10*3/uL (ref 0.7–4.0)
MCH: 27.1 pg (ref 26.0–34.0)
MCHC: 33.8 g/dL (ref 30.0–36.0)
MCV: 80.3 fL (ref 78.0–100.0)
Monocytes Absolute: 0.8 10*3/uL (ref 0.1–1.0)
Monocytes Relative: 10 % (ref 3–12)
Neutro Abs: 4.3 10*3/uL (ref 1.7–7.7)
Neutrophils Relative %: 56 % (ref 43–77)
Platelets: 443 10*3/uL — ABNORMAL HIGH (ref 150–400)
RBC: 3.8 MIL/uL — ABNORMAL LOW (ref 3.87–5.11)
RDW: 16.8 % — ABNORMAL HIGH (ref 11.5–15.5)
WBC: 7.5 10*3/uL (ref 4.0–10.5)

## 2012-07-27 LAB — URINALYSIS, ROUTINE W REFLEX MICROSCOPIC
Bilirubin Urine: NEGATIVE
Glucose, UA: NEGATIVE mg/dL
Hgb urine dipstick: NEGATIVE
Ketones, ur: 15 mg/dL — AB
Leukocytes, UA: NEGATIVE
Nitrite: NEGATIVE
Protein, ur: NEGATIVE mg/dL
Specific Gravity, Urine: 1.006 (ref 1.005–1.030)
Urobilinogen, UA: 0.2 mg/dL (ref 0.0–1.0)
pH: 7.5 (ref 5.0–8.0)

## 2012-07-27 LAB — COMPREHENSIVE METABOLIC PANEL
ALT: 14 U/L (ref 0–35)
AST: 18 U/L (ref 0–37)
Albumin: 3.7 g/dL (ref 3.5–5.2)
Alkaline Phosphatase: 62 U/L (ref 39–117)
BUN: 11 mg/dL (ref 6–23)
CO2: 25 mEq/L (ref 19–32)
Calcium: 9.7 mg/dL (ref 8.4–10.5)
Chloride: 100 mEq/L (ref 96–112)
Creatinine, Ser: 0.6 mg/dL (ref 0.50–1.10)
GFR calc Af Amer: >90 mL/min (ref >90)
GFR calc non Af Amer: >90 mL/min (ref >90)
Glucose, Bld: 88 mg/dL (ref 70–99)
Potassium: 3.3 mEq/L — ABNORMAL LOW (ref 3.5–5.1)
Sodium: 139 mEq/L (ref 135–145)
Total Bilirubin: 0.3 mg/dL (ref 0.3–1.2)
Total Protein: 8.2 g/dL (ref 6.0–8.3)

## 2012-07-27 LAB — PREGNANCY, URINE: Preg Test, Ur: NEGATIVE

## 2012-07-27 MED ORDER — IBUPROFEN 800 MG PO TABS
800.0000 mg | ORAL_TABLET | Freq: Once | ORAL | Status: AC
  Administered 2012-07-27: 800 mg via ORAL
  Filled 2012-07-27: qty 1

## 2012-07-27 MED ORDER — ONDANSETRON HCL 4 MG/2ML IJ SOLN
INTRAMUSCULAR | Status: AC
  Filled 2012-07-27: qty 2

## 2012-07-27 MED ORDER — ONDANSETRON HCL 4 MG/2ML IJ SOLN
4.0000 mg | Freq: Once | INTRAMUSCULAR | Status: AC
  Administered 2012-07-27: 4 mg via INTRAVENOUS

## 2012-07-27 MED ORDER — HYDROMORPHONE HCL PF 1 MG/ML IJ SOLN
INTRAMUSCULAR | Status: AC
  Administered 2012-07-27: 1 mg via INTRAVENOUS
  Filled 2012-07-27: qty 1

## 2012-07-27 MED ORDER — POTASSIUM CHLORIDE CRYS ER 20 MEQ PO TBCR
40.0000 meq | EXTENDED_RELEASE_TABLET | Freq: Once | ORAL | Status: AC
  Administered 2012-07-27: 40 meq via ORAL
  Filled 2012-07-27: qty 2

## 2012-07-27 MED ORDER — HYDROMORPHONE HCL PF 1 MG/ML IJ SOLN
1.0000 mg | Freq: Once | INTRAMUSCULAR | Status: AC
  Administered 2012-07-27: 1 mg via INTRAVENOUS

## 2012-07-27 MED ORDER — HYDROMORPHONE HCL PF 2 MG/ML IJ SOLN: 2.0000 mg | Freq: Once | INTRAMUSCULAR | Status: DC

## 2012-07-27 MED ORDER — PROMETHAZINE HCL 25 MG/ML IJ SOLN
25.0000 mg | Freq: Once | INTRAMUSCULAR | Status: AC
  Administered 2012-07-27: 25 mg via INTRAVENOUS
  Filled 2012-07-27: qty 1

## 2012-07-27 MED ORDER — ONDANSETRON 4 MG PO TBDP: 4.0000 mg | ORAL_TABLET | Freq: Once | ORAL | Status: DC

## 2012-07-27 NOTE — ED Notes (Signed)
Pt c/o back pain and constipation , Seen by GI yesterday , LAst BM 2 days ago

## 2012-07-27 NOTE — ED Notes (Signed)
PA at bedside.

## 2012-07-27 NOTE — ED Notes (Signed)
Pt c/o nausea. PA made aware. Orders received and initiated.

## 2012-07-27 NOTE — ED Provider Notes (Signed)
History     CSN: [id number]  Arrival date & time 07/27/12  1738   First MD Initiated Contact with Patient 07/27/12 1829      Chief Complaint  Patient presents with  . Constipation    (Consider location/radiation/quality/duration/timing/severity/associated sxs/prior treatment) HPI Comments: Kimberly Boyd is a 47 y/o F with PMHx of GERD, palpitations, HTN, back pain presenting to the ED with constipation that has been ongoing for the past 2-3 days. Patient reported that discomfort began approximately 4 days ago - stated that she has been experiencing abdominal pain that is constant and described as a constant sharp shooting pain, stated that when she lays still there is a constant sharp shooting pain, but reported that when she moves - the pain gets worse. Patient reported that her abdomen feels extremely bloated and has been having gas pains. Stated that she has been taking Miralax over the past couple of days. Stated that she was seen by GI yesterday - Dr. Arty Baumgartner - was prescribed Linzess and Ultraflora IB. Patient reported that she had a bowel movement this morning, at around 10:00AM, stated that medium size stool that started off firm and became soft. Patient reported that she has not had a BM since then, has not passed any gas. Stated that pain and discomfort got so bad that she called Dr. Arty Baumgartner - was instructed to take 2 Zantac tablets. Patient reported that she had a simiar episode in 2008-2009. Denied fever, nausea, vomiting, chest pain, shortness of breathe, difficulty breathing, hematochezia, melena.   Patient follows Dr. Arty Baumgartner regarding GERD.   The history is provided by the patient. No language interpreter was used.    Past Medical History  Diagnosis Date  . GERD (gastroesophageal reflux disease)   . Palpitations   . Complication of anesthesia     tachycardia, difficulty waking up  . Heart murmur     questionable  . Hypertension   . Back pain years.     chronic low back  pain   . Palpitations 2011    takes  propanolol     Past Surgical History  Procedure Laterality Date  . Laparoscopic cholecystectomy    . Lipoma resection      and redone  . Scar revision for the lipoma    . Dermoid cyst removal    . Cervical dysplasia laser    . Orif ankle fracture  08/09/2011    Procedure: OPEN REDUCTION INTERNAL FIXATION (ORIF) ANKLE FRACTURE;  Surgeon: Toni Arthurs, MD;  Location: [facility] OR;  Service: Orthopedics;  Laterality: Right;  ORIF Right Ankle Lateral Malleolus    History reviewed. No pertinent family history.  History  Substance Use Topics  . Smoking status: Never Smoker   . Smokeless tobacco: Never Used  . Alcohol Use: Yes     Comment: rare wine    OB History   Grav Para Term Preterm Abortions TAB SAB Ect Mult Living                  Review of Systems  Constitutional: Negative for fever and fatigue.  HENT: Negative for ear pain, congestion, sore throat, trouble swallowing, neck pain and neck stiffness.   Eyes: Negative for photophobia, pain and visual disturbance.  Respiratory: Negative for cough, chest tightness and shortness of breath.   Gastrointestinal: Positive for abdominal pain and constipation. Negative for nausea, vomiting, diarrhea, blood in stool and anal bleeding.  Genitourinary: Negative for dysuria, hematuria, decreased urine volume and difficulty  urinating.  Musculoskeletal: Positive for back pain. Negative for arthralgias.  Skin: Negative for wound.  Neurological: Negative for dizziness, weakness, light-headedness, numbness and headaches.  All other systems reviewed and are negative.    Allergies  Doxycycline  Home Medications   Current Outpatient Rx  Name  Route  Sig  Dispense  Refill  . aspirin 81 MG tablet   Oral   Take 81 mg by mouth daily.         Marland Kitchen dexlansoprazole (DEXILANT) 60 MG capsule   Oral   Take 60 mg by mouth daily.         . Probiotic Product (ALIGN PO)   Oral   Take 1 tablet by mouth daily as  needed. For digestion         . propranolol ER (INDERAL LA) 80 MG 24 hr capsule   Oral   Take 1 capsule (80 mg total) by mouth daily.   30 capsule   1     Pt needs follow up appt   . triamterene-hydrochlorothiazide (MAXZIDE-25) 37.5-25 MG per tablet   Oral   Take 0.5 tablets by mouth daily.             BP 136/75  Pulse 63  Temp(Src) 98.3 F (36.8 C)  Resp 18  Ht 5\' 7"  (1.702 m)  Wt 183 lb (83.008 kg)  BMI 28.66 kg/m2  SpO2 100%  LMP 07/17/2012  Physical Exam  Nursing note and vitals reviewed. Constitutional: She is oriented to person, place, and time. She appears well-developed and well-nourished. No distress.  HENT:  Head: Normocephalic and atraumatic.  Mouth/Throat: Oropharynx is clear and moist. No oropharyngeal exudate.  Uvula midline, symmetrical elevation  Eyes: Conjunctivae and EOM are normal. Pupils are equal, round, and reactive to light. Right eye exhibits no discharge. Left eye exhibits no discharge.  Neck: Normal range of motion. Neck supple. No tracheal deviation present.  Negative nuchal rigidity Negative neck stiffness Negative lymphadenpathy  Cardiovascular: Normal rate, regular rhythm and normal heart sounds.  Exam reveals no friction rub.   No murmur heard. Radial pulses 2+ bilaterally  Pulmonary/Chest: Effort normal and breath sounds normal. No respiratory distress. She has no wheezes. She has no rales. She exhibits no tenderness.  Abdominal: Soft. She exhibits no distension, no fluid wave, no ascites and no mass. There is no hepatosplenomegaly. There is tenderness (described as pressure, gasy pain when abdomen palpated) in the left upper quadrant and left lower quadrant. There is CVA tenderness (left side). There is no rigidity, no rebound, no guarding, no tenderness at McBurney's point and negative Murphy's sign. No hernia. Hernia confirmed negative in the ventral area.    BS normoactive on the right upper and lower quadrants BS slightly  hypoactive to the left upper and lower quadrants, but BS heard.  Lymphadenopathy:    She has no cervical adenopathy.  Neurological: She is alert and oriented to person, place, and time. No cranial nerve deficit. She exhibits normal muscle tone. Coordination normal.  Cranial nerves III-XII grossly intact  Skin: Skin is warm and dry. No rash noted. She is not diaphoretic. No erythema.  Psychiatric: She has a normal mood and affect. Her behavior is normal. Thought content normal.    ED Course  Procedures (including critical care time)  9:41PM  Patient re-evaluated and pain has gotten worse. Dilaudid 2 mg IV given. CT scan ordered to rule out kidney stones  11:00PM Patient feeling nausea - Zofran given.  11:24PM  Patient vomited.  Clear liquid fluids. Phenergan 25 mg IV given.   Labs Reviewed  URINALYSIS, ROUTINE W REFLEX MICROSCOPIC - Abnormal; Notable for the following:    Ketones, ur 15 (*)    All other components within normal limits  CBC WITH DIFFERENTIAL - Abnormal; Notable for the following:    RBC 3.80 (*)    Hemoglobin 10.3 (*)    HCT 30.5 (*)    RDW 16.8 (*)    Platelets 443 (*)    All other components within normal limits  COMPREHENSIVE METABOLIC PANEL - Abnormal; Notable for the following:    Potassium 3.3 (*)    All other components within normal limits  WET PREP, GENITAL  GC/CHLAMYDIA PROBE AMP  PREGNANCY, URINE   Ct Abdomen Pelvis Wo Contrast  07/27/2012   *RADIOLOGY REPORT*  Clinical Data: Left abdominal and flank pain.  CT ABDOMEN AND PELVIS WITHOUT CONTRAST  Technique:  Multidetector CT imaging of the abdomen and pelvis was performed following the standard protocol without intravenous contrast.  Comparison: 07/27/2012  Findings: No renal stones, hydronephrosis, or hydroureter observed. Some segments of the distal ureters are difficult to identify, but no calcification in the expected course of the distal ureters is noted.  Prior cholecystectomy.  Appendix normal.   Uterus 17.7 cm in length by 14 cm transverse by 9.0 cm anterior - posterior, with a left eccentric 8.8 cm mass most compatible with fibroid displacing the endometrial tissue to the right.  Ovaries not well seen.  Mild prominence of cervix.  Urinary bladder unremarkable.  No dilated bowel.  Sclerotic lesion at L2 is nonspecific, possibly chondroid.  Sclerosis along the iliac side of both sacroiliac joints.  Otherwise negative exam.  IMPRESSION:  1.  Prominently enlarged uterus.  Suspected 8.8 cm left uterine fibroid and possible other fibroids.  Ovaries not well seen. If not previously worked up then consider pelvic sonography. 2.  No calculi observed.  A specific cause for the patient's flank pain is not seen. 3.  Osteitis condensans ilii. 4.  Nonspecific sclerotic lesion in the L2 vertebral body.  This may simply be a somewhat sclerotic hemangioma.   Original Report Authenticated By: Gaylyn Rong, M.D.   Dg Abd Acute W/chest  07/27/2012   *RADIOLOGY REPORT*  Clinical Data: Constipation.  Evaluate for bowel obstruction.  ACUTE ABDOMEN SERIES (ABDOMEN 2 VIEW & CHEST 1 VIEW)  Comparison: None  Findings: The heart size and mediastinal contours are within normal limits.  Both lungs are clear.  The visualized skeletal structures are unremarkable.  The bowel gas pattern is non-obstructed.  There is no dilated loop of small bowel or fluid level identified.  Cholecystectomy clips noted in the right upper quadrant of the abdomen.  IMPRESSION:  1.  Nonobstructive bowel gas pattern. 2.  No acute cardiopulmonary abnormalities   Original Report Authenticated By: Signa Kell, M.D.     1. Constipation   2. Abdominal pain   3. GERD (gastroesophageal reflux disease)   4. HTN (hypertension)       MDM  Patient afebrile, normotensive, non-tachycardic, alert and oriented, saturation adequate on room air. Labs and imaging ordered.  CBC Hgb and HCt low - trend seen when compared to other studies - patient  reported that she knows she is anemic due to menstrual cycles CMP low potassium (3.3) - potassium 40 mEq PO ordered. UA negative findings Negative pregnancy test  Abdominal xray - nonobstructive bowel gas pattern, stool seen on image. CT scan abdomen and pelvis - negative renal stones,  hydronephrosis; suspected 8.8 cm left uterine fibroid; osteitis condensans ilii; sclerotic hemangioma. Discussed CT findings with Dr. Rosalia Hammers - reported that osteitis condensans ilii and sclerotic hemangioma are benign lesions. Dr. Charlann Boxer saw and assessed patient - cleared patient for discharge. Pain and nausea controlled in ED setting. Patient aseptic, non-toxic appearing, in no acute distress. Discharged patient. Discussed with patient to continue to stay hydrated and rest. Discussed with patient proper diet. Discussed with patient to continue medications as prescribed by Dr. Arty Baumgartner, gastroenterologist. Discussed with patient to follow-up with PCP and GI. Discussed with patient to monitor symptoms and if symptoms are to worsen or change to report back to the ED - stated that if symptoms do not get better within 48 hours to report back to the ED. Patient agreed to plan of care, understood, all questions answered.              Raymon Mutton, PA-C 07/28/12 [phone]

## 2012-07-28 NOTE — ED Provider Notes (Signed)
47 y.o. Female with crampy abdominal pain Abdomen- soft and nontender.    I performed a history and physical examination of Kimberly Boyd and discussed her management with Ms. Magnus Ivan.  I agree with the history, physical, assessment, and plan of care, with the following exceptions: None  I was present for the following procedures: None Time Spent in Critical Care of the patient: None Time spent in discussions with the patient and family: 10  RAY,DANIELLE Corlis Leak, MD 07/28/12 1520

## 2012-07-30 ENCOUNTER — Ambulatory Visit
Admission: RE | Admit: 2012-07-30 | Discharge: 2012-07-30 | Disposition: A | Discharge status: Home / Self Care | Payer: 59 | Source: Ambulatory Visit

## 2012-07-30 DIAGNOSIS — Z1231 Encounter for screening mammogram for malignant neoplasm of breast: Secondary | ICD-10-CM

## 2012-12-10 ENCOUNTER — Other Ambulatory Visit: Payer: Self-pay | Admitting: Obstetrics and Gynecology

## 2012-12-10 DIAGNOSIS — N920 Excessive and frequent menstruation with regular cycle: Secondary | ICD-10-CM

## 2012-12-10 DIAGNOSIS — D259 Leiomyoma of uterus, unspecified: Secondary | ICD-10-CM

## 2013-01-21 ENCOUNTER — Other Ambulatory Visit: Payer: Self-pay

## 2013-01-28 ENCOUNTER — Encounter: Payer: Self-pay | Admitting: Physician Assistant

## 2013-01-28 ENCOUNTER — Encounter (INDEPENDENT_AMBULATORY_CARE_PROVIDER_SITE_OTHER): Payer: Self-pay

## 2013-01-28 ENCOUNTER — Ambulatory Visit (INDEPENDENT_AMBULATORY_CARE_PROVIDER_SITE_OTHER): Payer: 59 | Admitting: Physician Assistant

## 2013-01-28 VITALS — BP 124/74 | HR 70 | Ht 67.0 in | Wt 188.0 lb

## 2013-01-28 DIAGNOSIS — I471 Supraventricular tachycardia: Secondary | ICD-10-CM

## 2013-01-28 DIAGNOSIS — I498 Other specified cardiac arrhythmias: Secondary | ICD-10-CM

## 2013-01-28 MED ORDER — PROPRANOLOL HCL ER 80 MG PO CP24: 80.0000 mg | ORAL_CAPSULE | Freq: Every day | ORAL | Status: DC

## 2013-01-28 NOTE — Progress Notes (Signed)
[address] [place], [place]  [address] Phone: [phone] Fax:  [phone]  Date:  01/28/2013   ID:  Kimberly Boyd, DOB 30-Jul-1965, MRN [id number]  PCP:  Gwynneth Aliment, MD  Cardiologist:  Dr. 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  LHC in 03/2007 demonstrated normal coronary arteries.  Last seen by Dr. Sherryl Manges in 08/2010.  Doing well since last seen. She is under a lot of stress with her mother who was recently diagnosed with cancer. She also has a daughter who is playing travel soccer. She is being actively recruited by several [employer] programs. She denies any recurrence of SVT. She has occasional palpitations. She denies chest pain, shortness of breath, syncope.  Recent Labs: 07/27/2012: ALT 14; Creatinine 0.60; Hemoglobin 10.3*; Potassium 3.3*   Wt Readings from Last 3 Encounters:  01/28/13 188 lb (85.276 kg)  07/27/12 183 lb (83.008 kg)  08/05/11 186 lb 4.6 oz (84.5 kg)     Past Medical History  Diagnosis Date  . GERD (gastroesophageal reflux disease)   . Palpitations   . Complication of anesthesia     tachycardia, difficulty waking up  . Heart murmur     questionable  . Hypertension   . Back pain years.     chronic low back pain   . Palpitations 2011    takes  propanolol     Current Outpatient Prescriptions  Medication Sig Dispense Refill  . aspirin 81 MG tablet Take 81 mg by mouth daily.      Marland Kitchen dexlansoprazole (DEXILANT) 60 MG capsule Take 60 mg by mouth daily.      . Fe Fum-FePoly-FA-Vit C-Vit B3 (INTEGRA F PO) Take 1 tablet by mouth daily.      . Probiotic Product (ALIGN PO) Take 1 tablet by mouth daily as needed. For digestion      . propranolol ER (INDERAL LA) 80 MG 24 hr capsule Take 1 capsule (80 mg total) by mouth daily.  30 capsule  1  . triamterene-hydrochlorothiazide (MAXZIDE-25) 37.5-25 MG per tablet Take 0.5 tablets by mouth daily.          No current facility-administered medications for this visit.    Allergies:   Doxycycline   Social History:  The patient  reports that she has never smoked. She has never used smokeless tobacco. She reports that she drinks alcohol. She reports that she does not use illicit drugs.   Family History:  The patient's family history is not on file.   ROS:  Please see the history of present illness.      All other systems reviewed and negative.   PHYSICAL EXAM: VS:  BP 124/74  Pulse 70  Ht 5\' 7"  (1.702 m)  Wt 188 lb (85.276 kg)  BMI 29.44 kg/m2 Well nourished, well developed, in no acute distress HEENT: normal Neck: no JVD Cardiac:  normal S1, S2; RRR; no murmur Lungs:  clear to auscultation bilaterally, no wheezing, rhonchi or rales Abd: soft, nontender, no hepatomegaly Ext: no edema Skin: warm and dry Neuro:  CNs 2-12 intact, no focal abnormalities noted  EKG:  NSR, HR 70, normal axis, NSSTTW changes     ASSESSMENT AND PLAN:  1. SVT: No recurrence. She is tolerating propranolol. Blood pressure is controlled. Continue current therapy. 2. Hypertension: Controlled. Renal function and potassium is monitored by primary care. 3.  Disposition: Follow up with Dr. Graciela Husbands in one year.  Signed, Tereso Newcomer, PA-C  01/28/2013 12:18 PM

## 2013-01-28 NOTE — Patient Instructions (Signed)
A REFILL FOR YOUR PROPRANOLOL HAS BEEN SENT IN TODAY  Your physician wants you to follow-up in: 1 YEAR WITH DR. Graciela Husbands. You will receive a reminder letter in the mail two months in advance. If you don't receive a letter, please call our office to schedule the follow-up appointment.  NO CHANGES WERE MADE TODAY

## 2013-02-18 ENCOUNTER — Other Ambulatory Visit: Payer: Self-pay

## 2013-02-18 DIAGNOSIS — I471 Supraventricular tachycardia: Secondary | ICD-10-CM

## 2013-02-18 MED ORDER — PROPRANOLOL HCL ER 80 MG PO CP24: 80.0000 mg | ORAL_CAPSULE | Freq: Every day | ORAL | Status: DC

## 2013-07-18 ENCOUNTER — Other Ambulatory Visit: Payer: Self-pay

## 2013-07-18 DIAGNOSIS — Z1231 Encounter for screening mammogram for malignant neoplasm of breast: Secondary | ICD-10-CM

## 2013-07-29 ENCOUNTER — Other Ambulatory Visit: Payer: Self-pay | Admitting: Emergency Medicine

## 2013-07-29 ENCOUNTER — Other Ambulatory Visit (HOSPITAL_COMMUNITY): Payer: Self-pay | Admitting: Interventional Radiology

## 2013-07-29 DIAGNOSIS — I10 Essential (primary) hypertension: Secondary | ICD-10-CM

## 2013-07-29 DIAGNOSIS — N920 Excessive and frequent menstruation with regular cycle: Secondary | ICD-10-CM

## 2013-07-29 DIAGNOSIS — D259 Leiomyoma of uterus, unspecified: Secondary | ICD-10-CM

## 2013-07-30 ENCOUNTER — Other Ambulatory Visit: Payer: Self-pay | Admitting: Diagnostic Radiology

## 2013-07-30 DIAGNOSIS — D259 Leiomyoma of uterus, unspecified: Secondary | ICD-10-CM

## 2013-07-30 DIAGNOSIS — N920 Excessive and frequent menstruation with regular cycle: Secondary | ICD-10-CM

## 2013-08-01 LAB — CREATININE WITH EST GFR
Creat: 0.61 mg/dL (ref 0.50–1.10)
GFR, Est African American: >89 mL/min
GFR, Est Non African American: >89 mL/min

## 2013-08-01 LAB — BUN: BUN: 12 mg/dL (ref 6–23)

## 2013-08-02 ENCOUNTER — Encounter (INDEPENDENT_AMBULATORY_CARE_PROVIDER_SITE_OTHER): Payer: Self-pay

## 2013-08-02 ENCOUNTER — Ambulatory Visit
Admission: RE | Admit: 2013-08-02 | Discharge: 2013-08-02 | Disposition: A | Discharge status: Home / Self Care | Payer: 59 | Source: Ambulatory Visit

## 2013-08-02 DIAGNOSIS — Z1231 Encounter for screening mammogram for malignant neoplasm of breast: Secondary | ICD-10-CM

## 2013-08-03 ENCOUNTER — Ambulatory Visit (HOSPITAL_BASED_OUTPATIENT_CLINIC_OR_DEPARTMENT_OTHER): Payer: 59

## 2013-08-07 ENCOUNTER — Ambulatory Visit (INDEPENDENT_AMBULATORY_CARE_PROVIDER_SITE_OTHER): Payer: 59

## 2013-08-07 ENCOUNTER — Other Ambulatory Visit: Payer: Self-pay

## 2013-08-07 ENCOUNTER — Other Ambulatory Visit: Payer: 59

## 2013-08-07 DIAGNOSIS — D259 Leiomyoma of uterus, unspecified: Secondary | ICD-10-CM

## 2013-08-07 DIAGNOSIS — N83209 Unspecified ovarian cyst, unspecified side: Secondary | ICD-10-CM

## 2013-08-07 DIAGNOSIS — N920 Excessive and frequent menstruation with regular cycle: Secondary | ICD-10-CM

## 2013-08-07 DIAGNOSIS — N852 Hypertrophy of uterus: Secondary | ICD-10-CM

## 2013-08-07 DIAGNOSIS — M5126 Other intervertebral disc displacement, lumbar region: Secondary | ICD-10-CM

## 2013-08-07 MED ORDER — GADOBENATE DIMEGLUMINE 529 MG/ML IV SOLN
20.0000 mL | Freq: Once | INTRAVENOUS | Status: AC | PRN
Start: 2013-08-07 — End: 2013-08-07

## 2013-08-08 ENCOUNTER — Other Ambulatory Visit (HOSPITAL_COMMUNITY): Payer: Self-pay | Admitting: Interventional Radiology

## 2013-08-08 ENCOUNTER — Telehealth: Payer: Self-pay | Admitting: Emergency Medicine

## 2013-08-08 DIAGNOSIS — D259 Leiomyoma of uterus, unspecified: Secondary | ICD-10-CM

## 2013-08-08 DIAGNOSIS — N920 Excessive and frequent menstruation with regular cycle: Secondary | ICD-10-CM

## 2013-08-08 DIAGNOSIS — D649 Anemia, unspecified: Secondary | ICD-10-CM

## 2013-08-08 NOTE — Telephone Encounter (Signed)
CALLED PT TO LET HER KNOW THAT DR WATTS REVIEWED HER MRI AND SHE IS A GOOD CANDIDATE FOR [place].  SHE ALSO MENTIONED THAT SHE IS ON ORAL FE.  HAS BEEN VERY ANEMIC.   GAVE HER AN APPT FOR 08-21-13 AT 800AM/745 TO DISCUSS MRI AND [place] PROCEDURE W/ DR WATTS.  WILL GO AHEAD AND CK ON INS. AUTHO .

## 2013-08-21 ENCOUNTER — Ambulatory Visit
Admission: RE | Admit: 2013-08-21 | Discharge: 2013-08-21 | Disposition: A | Discharge status: Home / Self Care | Payer: 59 | Source: Ambulatory Visit | Attending: Interventional Radiology | Admitting: Interventional Radiology

## 2013-08-21 DIAGNOSIS — D649 Anemia, unspecified: Secondary | ICD-10-CM

## 2013-08-21 DIAGNOSIS — D259 Leiomyoma of uterus, unspecified: Secondary | ICD-10-CM

## 2013-08-21 DIAGNOSIS — N920 Excessive and frequent menstruation with regular cycle: Secondary | ICD-10-CM

## 2013-10-13 ENCOUNTER — Emergency Department (HOSPITAL_COMMUNITY)
Admission: EM | Admit: 2013-10-13 | Discharge: 2013-10-13 | Disposition: A | Discharge status: Home / Self Care | Payer: 59 | Source: Home / Self Care | Attending: Family Medicine | Admitting: Family Medicine

## 2013-10-13 ENCOUNTER — Encounter (HOSPITAL_COMMUNITY): Payer: Self-pay | Admitting: Emergency Medicine

## 2013-10-13 DIAGNOSIS — R300 Dysuria: Secondary | ICD-10-CM

## 2013-10-13 LAB — POCT URINALYSIS DIP (DEVICE)
Bilirubin Urine: NEGATIVE
Glucose, UA: NEGATIVE mg/dL
Ketones, ur: NEGATIVE mg/dL
Nitrite: NEGATIVE
Protein, ur: 30 mg/dL — AB
Specific Gravity, Urine: >=1.03 (ref 1.005–1.030)
Urobilinogen, UA: 0.2 mg/dL (ref 0.0–1.0)
pH: 6 (ref 5.0–8.0)

## 2013-10-13 LAB — POCT PREGNANCY, URINE: Preg Test, Ur: NEGATIVE

## 2013-10-13 MED ORDER — NITROFURANTOIN MONOHYD MACRO 100 MG PO CAPS: 100.0000 mg | ORAL_CAPSULE | Freq: Two times a day (BID) | ORAL | Status: DC

## 2013-10-13 NOTE — ED Notes (Signed)
C/o symptoms of another UTI, frequency, burning, feels as if she is not emptying her bladder

## 2013-10-13 NOTE — Discharge Instructions (Signed)

## 2013-10-13 NOTE — ED Provider Notes (Signed)
CSN: [id number]     Arrival date & time 10/13/13  1223 History   First MD Initiated Contact with Patient 10/13/13 1245     Chief Complaint  Patient presents with  . Urinary Tract Infection   (Consider location/radiation/quality/duration/timing/severity/associated sxs/prior Treatment) HPI Comments: LNMP: currently  Patient is a 48 y.o. female presenting with urinary tract infection. The history is provided by the patient.  Urinary Tract Infection This is a new problem. Episode onset: sx began 3 days ago. The problem occurs constantly. The problem has been gradually worsening. Associated symptoms comments: +dysuria, urgency and frequency.    Past Medical History  Diagnosis Date  . GERD (gastroesophageal reflux disease)   . Palpitations   . Complication of anesthesia     tachycardia, difficulty waking up  . Heart murmur     questionable  . Hypertension   . Back pain years.     chronic low back pain   . Palpitations 2011    takes  propanolol    Past Surgical History  Procedure Laterality Date  . Laparoscopic cholecystectomy    . Lipoma resection      and redone  . Scar revision for the lipoma    . Dermoid cyst removal    . Cervical dysplasia laser    . Orif ankle fracture  08/09/2011    Procedure: OPEN REDUCTION INTERNAL FIXATION (ORIF) ANKLE FRACTURE;  Surgeon: Wylene Simmer, MD;  Location: [facility];  Service: Orthopedics;  Laterality: Right;  ORIF Right Ankle Lateral Malleolus   History reviewed. No pertinent family history. History  Substance Use Topics  . Smoking status: Never Smoker   . Smokeless tobacco: Never Used  . Alcohol Use: Yes     Comment: rare wine   OB History   Grav Para Term Preterm Abortions TAB SAB Ect Mult Living                 Review of Systems  All other systems reviewed and are negative.   Allergies  Doxycycline  Home Medications   Prior to Admission medications   Medication Sig Start Date End Date Taking? Authorizing Provider  aspirin 81  MG tablet Take 81 mg by mouth daily.   Yes Historical Provider, MD  dexlansoprazole (DEXILANT) 60 MG capsule Take 60 mg by mouth daily.   Yes Historical Provider, MD  Fe Fum-FePoly-FA-Vit C-Vit B3 (INTEGRA F PO) Take 1 tablet by mouth daily.   Yes Historical Provider, MD  Probiotic Product (ALIGN PO) Take 1 tablet by mouth daily as needed. For digestion   Yes Historical Provider, MD  propranolol ER (INDERAL LA) 80 MG 24 hr capsule Take 1 capsule (80 mg total) by mouth daily. 02/18/13  Yes Deboraha Sprang, MD  nitrofurantoin, macrocrystal-monohydrate, (MACROBID) 100 MG capsule Take 1 capsule (100 mg total) by mouth 2 (two) times daily. X 7 days 10/13/13   Annett Gula Presson, PA  triamterene-hydrochlorothiazide (MAXZIDE-25) 37.5-25 MG per tablet Take 0.5 tablets by mouth daily.      Historical Provider, MD   BP 133/81  Pulse 75  Temp(Src) 98.3 F (36.8 C) (Oral)  Resp 18  SpO2 100%  LMP 10/04/2013 Physical Exam  Nursing note and vitals reviewed. Constitutional: She is oriented to person, place, and time. She appears well-developed and well-nourished. No distress.  HENT:  Head: Normocephalic and atraumatic.  Eyes: Conjunctivae are normal. No scleral icterus.  Cardiovascular: Normal rate, regular rhythm and normal heart sounds.   Pulmonary/Chest: Effort normal and breath sounds normal.  Abdominal: Soft. Bowel sounds are normal.  +sense of very mild suprapubic pressure when emptying bladder  Musculoskeletal: Normal range of motion.  Neurological: She is alert and oriented to person, place, and time.  Skin: Skin is warm and dry. No rash noted. No erythema.  Psychiatric: She has a normal mood and affect. Her behavior is normal.    ED Course  Procedures (including critical care time) Labs Review Labs Reviewed  POCT URINALYSIS DIP (DEVICE) - Abnormal; Notable for the following:    Hgb urine dipstick SMALL (*)    Protein, ur 30 (*)    Leukocytes, UA SMALL (*)    All other components within  normal limits  URINE CULTURE  POCT PREGNANCY, URINE    Imaging Review No results found.   MDM   1. Dysuria    UA only with trace Hg (likely contamination from current menses) and small amount of LE. UPT negative. Will send specimen for C&S and provide Rx for 7 day course of Macrobid and advise close follow up with PCP if no improvement.     [place], [place] 10/13/13 1324

## 2013-10-14 NOTE — ED Provider Notes (Signed)
Medical screening examination/treatment/procedure(s) were performed by a resident physician or non-physician practitioner and as the supervising physician I was immediately available for consultation/collaboration.  Lynne Leader, MD    Gregor Hams, MD 10/14/13 [phone]

## 2013-11-06 ENCOUNTER — Other Ambulatory Visit: Payer: Self-pay | Admitting: Interventional Radiology

## 2013-11-06 DIAGNOSIS — D259 Leiomyoma of uterus, unspecified: Secondary | ICD-10-CM

## 2013-11-07 ENCOUNTER — Telehealth: Payer: Self-pay | Admitting: Emergency Medicine

## 2013-11-07 NOTE — Telephone Encounter (Signed)
LMOVM FOR PT ON CELL # TO LET HER KNOW INS. DOES NOT REQUIRE AUTHO FOR [place] PROCEDURE AND TIFF AT [facility] -IR WILL CALL HER TO SET UP PROCEDURE.

## 2013-11-22 ENCOUNTER — Encounter (HOSPITAL_COMMUNITY): Payer: Self-pay | Admitting: Pharmacy Technician

## 2013-11-25 ENCOUNTER — Other Ambulatory Visit: Payer: Self-pay | Admitting: Radiology

## 2013-11-26 ENCOUNTER — Encounter (HOSPITAL_COMMUNITY): Payer: Self-pay

## 2013-11-26 ENCOUNTER — Ambulatory Visit (HOSPITAL_COMMUNITY)
Admission: RE | Admit: 2013-11-26 | Discharge: 2013-11-26 | Disposition: A | Discharge status: Home / Self Care | Payer: 59 | Source: Ambulatory Visit | Attending: Interventional Radiology | Admitting: Interventional Radiology

## 2013-11-26 ENCOUNTER — Other Ambulatory Visit: Payer: Self-pay | Admitting: Interventional Radiology

## 2013-11-26 ENCOUNTER — Observation Stay (HOSPITAL_COMMUNITY)
Admission: RE | Admit: 2013-11-26 | Discharge: 2013-11-27 | Disposition: A | Discharge status: Home / Self Care | Payer: 59 | Source: Ambulatory Visit | Attending: Interventional Radiology | Admitting: Interventional Radiology

## 2013-11-26 VITALS — BP 104/62 | HR 68 | Temp 98.0°F | Resp 16 | Ht 67.0 in | Wt 179.0 lb

## 2013-11-26 DIAGNOSIS — D259 Leiomyoma of uterus, unspecified: Secondary | ICD-10-CM

## 2013-11-26 DIAGNOSIS — N920 Excessive and frequent menstruation with regular cycle: Secondary | ICD-10-CM | POA: Insufficient documentation

## 2013-11-26 DIAGNOSIS — I10 Essential (primary) hypertension: Secondary | ICD-10-CM | POA: Insufficient documentation

## 2013-11-26 DIAGNOSIS — K219 Gastro-esophageal reflux disease without esophagitis: Secondary | ICD-10-CM | POA: Diagnosis not present

## 2013-11-26 LAB — CBC WITH DIFFERENTIAL/PLATELET
Basophils Absolute: 0 10*3/uL (ref 0.0–0.1)
Basophils Relative: 1 % (ref 0–1)
Eosinophils Absolute: 0.1 10*3/uL (ref 0.0–0.7)
Eosinophils Relative: 2 % (ref 0–5)
HCT: 36.8 % (ref 36.0–46.0)
Hemoglobin: 12.3 g/dL (ref 12.0–15.0)
Lymphocytes Relative: 35 % (ref 12–46)
Lymphs Abs: 1.5 10*3/uL (ref 0.7–4.0)
MCH: 31.3 pg (ref 26.0–34.0)
MCHC: 33.4 g/dL (ref 30.0–36.0)
MCV: 93.6 fL (ref 78.0–100.0)
Monocytes Absolute: 0.6 10*3/uL (ref 0.1–1.0)
Monocytes Relative: 14 % — ABNORMAL HIGH (ref 3–12)
Neutro Abs: 2.1 10*3/uL (ref 1.7–7.7)
Neutrophils Relative %: 48 % (ref 43–77)
Platelets: 313 10*3/uL (ref 150–400)
RBC: 3.93 MIL/uL (ref 3.87–5.11)
RDW: 15.2 % (ref 11.5–15.5)
WBC: 4.4 10*3/uL (ref 4.0–10.5)

## 2013-11-26 LAB — APTT: aPTT: 31 seconds (ref 24–37)

## 2013-11-26 LAB — BASIC METABOLIC PANEL
Anion gap: 15 (ref 5–15)
BUN: 13 mg/dL (ref 6–23)
CO2: 24 mEq/L (ref 19–32)
Calcium: 9.4 mg/dL (ref 8.4–10.5)
Chloride: 100 mEq/L (ref 96–112)
Creatinine, Ser: 0.68 mg/dL (ref 0.50–1.10)
GFR calc Af Amer: >90 mL/min (ref >90)
GFR calc non Af Amer: >90 mL/min (ref >90)
Glucose, Bld: 101 mg/dL — ABNORMAL HIGH (ref 70–99)
Potassium: 3.8 mEq/L (ref 3.7–5.3)
Sodium: 139 mEq/L (ref 137–147)

## 2013-11-26 LAB — PROTIME-INR
INR: 0.99 (ref 0.00–1.49)
Prothrombin Time: 13.1 seconds (ref 11.6–15.2)

## 2013-11-26 LAB — HCG, SERUM, QUALITATIVE: Preg, Serum: NEGATIVE

## 2013-11-26 MED ORDER — PROPRANOLOL HCL ER 80 MG PO CP24
80.0000 mg | ORAL_CAPSULE | Freq: Every morning | ORAL | Status: DC
  Administered 2013-11-27: 80 mg via ORAL
  Filled 2013-11-26: qty 1

## 2013-11-26 MED ORDER — MIDAZOLAM HCL 2 MG/2ML IJ SOLN
INTRAMUSCULAR | Status: AC
  Filled 2013-11-26: qty 6

## 2013-11-26 MED ORDER — FENTANYL CITRATE 0.05 MG/ML IJ SOLN
INTRAMUSCULAR | Status: AC | PRN
  Administered 2013-11-26 (×4): 25 ug via INTRAVENOUS
  Administered 2013-11-26 (×2): 50 ug via INTRAVENOUS
  Administered 2013-11-26: 25 ug via INTRAVENOUS
  Administered 2013-11-26: 50 ug via INTRAVENOUS
  Administered 2013-11-26: 25 ug via INTRAVENOUS

## 2013-11-26 MED ORDER — ONDANSETRON HCL 4 MG/2ML IJ SOLN
INTRAMUSCULAR | Status: AC
  Filled 2013-11-26: qty 2

## 2013-11-26 MED ORDER — DIPHENHYDRAMINE HCL 50 MG/ML IJ SOLN
12.5000 mg | Freq: Four times a day (QID) | INTRAMUSCULAR | Status: DC | PRN
Start: 2013-11-26 — End: 2013-11-26

## 2013-11-26 MED ORDER — HYDROMORPHONE 0.3 MG/ML IV SOLN
INTRAVENOUS | Status: DC
  Administered 2013-11-26: 0.999 mg via INTRAVENOUS
  Administered 2013-11-26: 0.3 mg via INTRAVENOUS
  Administered 2013-11-27: 0.799 mg via INTRAVENOUS
  Administered 2013-11-27: 0.4 mg via INTRAVENOUS

## 2013-11-26 MED ORDER — HYDROMORPHONE HCL PF 1 MG/ML IJ SOLN
INTRAMUSCULAR | Status: AC | PRN
  Administered 2013-11-26: 0.5 mg via INTRAVENOUS

## 2013-11-26 MED ORDER — TRIAMTERENE-HCTZ 37.5-25 MG PO TABS
0.5000 | ORAL_TABLET | Freq: Every morning | ORAL | Status: DC
  Administered 2013-11-27: 0.5 via ORAL
  Filled 2013-11-26: qty 0.5

## 2013-11-26 MED ORDER — IOHEXOL 300 MG/ML  SOLN
80.0000 mL | Freq: Once | INTRAMUSCULAR | Status: AC | PRN
  Administered 2013-11-26: 1 mL via INTRA_ARTERIAL

## 2013-11-26 MED ORDER — SODIUM CHLORIDE 0.9 % IJ SOLN: 9.0000 mL | INTRAMUSCULAR | Status: DC | PRN

## 2013-11-26 MED ORDER — DIPHENHYDRAMINE HCL 12.5 MG/5ML PO ELIX
12.5000 mg | ORAL_SOLUTION | Freq: Four times a day (QID) | ORAL | Status: DC | PRN
  Filled 2013-11-26: qty 5

## 2013-11-26 MED ORDER — CEFAZOLIN SODIUM-DEXTROSE 2-3 GM-% IV SOLR
INTRAVENOUS | Status: AC
  Administered 2013-11-26: 2 g via INTRAVENOUS
  Filled 2013-11-26: qty 50

## 2013-11-26 MED ORDER — INTEGRA F 125-1 MG PO CAPS: 1.0000 | ORAL_CAPSULE | Freq: Every day | ORAL | Status: DC

## 2013-11-26 MED ORDER — ONDANSETRON HCL 4 MG/2ML IJ SOLN
INTRAMUSCULAR | Status: AC | PRN
  Administered 2013-11-26: 4 mg via INTRAVENOUS

## 2013-11-26 MED ORDER — ONDANSETRON HCL 4 MG/2ML IJ SOLN: 4.0000 mg | Freq: Four times a day (QID) | INTRAMUSCULAR | Status: DC | PRN

## 2013-11-26 MED ORDER — MIDAZOLAM HCL 2 MG/2ML IJ SOLN
INTRAMUSCULAR | Status: AC | PRN
  Administered 2013-11-26 (×2): 1 mg via INTRAVENOUS
  Administered 2013-11-26: 0.5 mg via INTRAVENOUS
  Administered 2013-11-26: 1 mg via INTRAVENOUS
  Administered 2013-11-26: 0.5 mg via INTRAVENOUS
  Administered 2013-11-26: 1 mg via INTRAVENOUS
  Administered 2013-11-26 (×5): 0.5 mg via INTRAVENOUS
  Administered 2013-11-26: 1 mg via INTRAVENOUS
  Administered 2013-11-26: 0.5 mg via INTRAVENOUS

## 2013-11-26 MED ORDER — KETOROLAC TROMETHAMINE 30 MG/ML IJ SOLN
30.0000 mg | Freq: Once | INTRAMUSCULAR | Status: AC
  Administered 2013-11-26: 30 mg via INTRAVENOUS
  Filled 2013-11-26: qty 1

## 2013-11-26 MED ORDER — TRIAMTERENE-HCTZ 37.5-25 MG PO TABS
0.5000 | ORAL_TABLET | Freq: Every morning | ORAL | Status: DC
  Filled 2013-11-26: qty 0.5

## 2013-11-26 MED ORDER — DIPHENHYDRAMINE HCL 50 MG/ML IJ SOLN: 12.5000 mg | Freq: Four times a day (QID) | INTRAMUSCULAR | Status: DC | PRN

## 2013-11-26 MED ORDER — ONDANSETRON HCL 4 MG/2ML IJ SOLN
4.0000 mg | Freq: Four times a day (QID) | INTRAMUSCULAR | Status: DC | PRN
  Administered 2013-11-27: 4 mg via INTRAVENOUS
  Filled 2013-11-26: qty 2

## 2013-11-26 MED ORDER — LIDOCAINE HCL 1 % IJ SOLN
INTRAMUSCULAR | Status: AC
  Filled 2013-11-26: qty 20

## 2013-11-26 MED ORDER — INTEGRA F 125-1 MG PO CAPS: ORAL_CAPSULE | Freq: Every day | ORAL | Status: DC

## 2013-11-26 MED ORDER — FENTANYL CITRATE 0.05 MG/ML IJ SOLN
INTRAMUSCULAR | Status: AC
  Filled 2013-11-26: qty 6

## 2013-11-26 MED ORDER — NALOXONE HCL 0.4 MG/ML IJ SOLN: 0.4000 mg | INTRAMUSCULAR | Status: DC | PRN

## 2013-11-26 MED ORDER — SODIUM CHLORIDE 0.9 % IV SOLN
INTRAVENOUS | Status: DC
  Administered 2013-11-26: 08:00:00 via INTRAVENOUS

## 2013-11-26 MED ORDER — PANTOPRAZOLE SODIUM 40 MG PO TBEC
40.0000 mg | DELAYED_RELEASE_TABLET | Freq: Every day | ORAL | Status: DC
  Filled 2013-11-26: qty 1

## 2013-11-26 MED ORDER — CEFAZOLIN SODIUM-DEXTROSE 2-3 GM-% IV SOLR
2.0000 g | INTRAVENOUS | Status: AC
Start: 2013-11-26 — End: 2013-11-26
  Administered 2013-11-26: 2 g via INTRAVENOUS

## 2013-11-26 MED ORDER — HYDROMORPHONE 0.3 MG/ML IV SOLN: INTRAVENOUS | Status: DC

## 2013-11-26 MED ORDER — PROPRANOLOL HCL ER 80 MG PO CP24
80.0000 mg | ORAL_CAPSULE | Freq: Every morning | ORAL | Status: DC
  Filled 2013-11-26: qty 1

## 2013-11-26 MED ORDER — HYDROMORPHONE HCL PF 2 MG/ML IJ SOLN
INTRAMUSCULAR | Status: AC
  Filled 2013-11-26: qty 1

## 2013-11-26 MED ORDER — PANTOPRAZOLE SODIUM 40 MG PO TBEC
40.0000 mg | DELAYED_RELEASE_TABLET | Freq: Every day | ORAL | Status: DC
  Filled 2013-11-26 (×2): qty 1

## 2013-11-26 MED ORDER — NALOXONE HCL 0.4 MG/ML IJ SOLN
0.4000 mg | INTRAMUSCULAR | Status: DC | PRN
Start: 2013-11-26 — End: 2013-11-26

## 2013-11-26 MED ORDER — HYDROMORPHONE 0.3 MG/ML IV SOLN
INTRAVENOUS | Status: AC
  Filled 2013-11-26: qty 25

## 2013-11-26 MED ORDER — MIDAZOLAM HCL 2 MG/2ML IJ SOLN
INTRAMUSCULAR | Status: AC
  Filled 2013-11-26: qty 4

## 2013-11-26 NOTE — H&P (Signed)
Chief Complaint: "I'm here for my fibroid embolization" HPI: Kimberly Boyd is an 48 y.o. female with hx of menorrhagia and uterine fibroids She has been seen in consult by Dr. Pascal Lux for consideration of uterine artery embolization. See PACS imaging for full consult report. She has been scheduled for procedure today PMHx, meds, labs reviewed. She diod start her menses yesterday, which is a little early for her. She has been NPO otherwise this am.  Past Medical History:  Past Medical History  Diagnosis Date  . GERD (gastroesophageal reflux disease)   . Palpitations   . Complication of anesthesia     tachycardia, difficulty waking up  . Heart murmur     questionable  . Hypertension   . Back pain years.     chronic low back pain   . Palpitations 2011    takes  propanolol     Past Surgical History:  Past Surgical History  Procedure Laterality Date  . Laparoscopic cholecystectomy    . Lipoma resection      and redone  . Scar revision for the lipoma    . Dermoid cyst removal    . Cervical dysplasia laser    . Orif ankle fracture  08/09/2011    Procedure: OPEN REDUCTION INTERNAL FIXATION (ORIF) ANKLE FRACTURE;  Surgeon: Wylene Simmer, MD;  Location: [facility];  Service: Orthopedics;  Laterality: Right;  ORIF Right Ankle Lateral Malleolus    Family History: History reviewed. No pertinent family history.  Social History:  reports that she has never smoked. She has never used smokeless tobacco. She reports that she drinks alcohol. She reports that she does not use illicit drugs.  Allergies:  Allergies  Allergen Reactions  . Doxycycline Other (See Comments)    Esophagitis; opened up in my esophagus    Medications:   Medication List    ASK your doctor about these medications       ADVIL 200 MG tablet  Generic drug:  ibuprofen  Take 200 mg by mouth as needed.     ALIGN PO  Take 1 tablet by mouth daily as needed. For digestion     ALPRAZolam 0.5 MG tablet  Commonly known as:   XANAX  Take 0.5 mg by mouth as needed for anxiety.     dexlansoprazole 60 MG capsule  Commonly known as:  DEXILANT  Take 60 mg by mouth every morning.     INTEGRA F PO  Take 1 tablet by mouth every morning.     propranolol ER 80 MG 24 hr capsule  Commonly known as:  INDERAL LA  Take 80 mg by mouth every morning.     triamterene-hydrochlorothiazide 37.5-25 MG per tablet  Commonly known as:  MAXZIDE-25  Take 0.5 tablets by mouth every morning.        Please HPI for pertinent positives, otherwise complete 10 system ROS negative.  Physical Exam: BP 106/78  Resp 16  Ht '5\' 7"'  (1.702 m)  Wt 179 lb (81.194 kg)  BMI 28.03 kg/m2  SpO2 100%  LMP 11/25/2013 Body mass index is 28.03 kg/(m^2).   General Appearance:  Alert, cooperative, no distress, appears stated age  Head:  Normocephalic, without obvious abnormality, atraumatic  ENT: Unremarkable  Neck: Supple, symmetrical, trachea midline  Lungs:   Clear to auscultation bilaterally, no w/r/r, respirations unlabored without use of accessory muscles.  Chest Wall:  No tenderness or deformity  Heart:  Regular rate and rhythm, S1, S2 normal, no murmur, rub or gallop.  Abdomen:  Soft, non-tender, non distended.  Extremities: Extremities normal, atraumatic, no cyanosis or edema  Pulses: 2+ and symmetric femoral and pedal  Neurologic: Normal affect, no gross deficits.  Labs: Results for orders placed during the hospital encounter of 11/26/13 (from the past 48 hour(s))  APTT     Status: None   Collection Time    11/26/13  7:58 AM      Result Value Ref Range   aPTT 31  24 - 37 seconds  BASIC METABOLIC PANEL     Status: Abnormal   Collection Time    11/26/13  7:58 AM      Result Value Ref Range   Sodium 139  137 - 147 mEq/L   Potassium 3.8  3.7 - 5.3 mEq/L   Chloride 100  96 - 112 mEq/L   CO2 24  19 - 32 mEq/L   Glucose, Bld 101 (*) 70 - 99 mg/dL   BUN 13  6 - 23 mg/dL   Creatinine, Ser 0.68  0.50 - 1.10 mg/dL   Calcium 9.4   8.4 - 10.5 mg/dL   GFR calc non Af Amer >90  >90 mL/min   GFR calc Af Amer >90  >90 mL/min   Comment: (NOTE)     The eGFR has been calculated using the CKD EPI equation.     This calculation has not been validated in all clinical situations.     eGFR's persistently <90 mL/min signify possible Chronic Kidney     Disease.   Anion gap 15  5 - 15  CBC WITH DIFFERENTIAL     Status: Abnormal   Collection Time    11/26/13  7:58 AM      Result Value Ref Range   WBC 4.4  4.0 - 10.5 K/uL   RBC 3.93  3.87 - 5.11 MIL/uL   Hemoglobin 12.3  12.0 - 15.0 g/dL   HCT 36.8  36.0 - 46.0 %   MCV 93.6  78.0 - 100.0 fL   MCH 31.3  26.0 - 34.0 pg   MCHC 33.4  30.0 - 36.0 g/dL   RDW 15.2  11.5 - 15.5 %   Platelets 313  150 - 400 K/uL   Neutrophils Relative % 48  43 - 77 %   Neutro Abs 2.1  1.7 - 7.7 K/uL   Lymphocytes Relative 35  12 - 46 %   Lymphs Abs 1.5  0.7 - 4.0 K/uL   Monocytes Relative 14 (*) 3 - 12 %   Monocytes Absolute 0.6  0.1 - 1.0 K/uL   Eosinophils Relative 2  0 - 5 %   Eosinophils Absolute 0.1  0.0 - 0.7 K/uL   Basophils Relative 1  0 - 1 %   Basophils Absolute 0.0  0.0 - 0.1 K/uL  HCG, SERUM, QUALITATIVE     Status: None   Collection Time    11/26/13  7:58 AM      Result Value Ref Range   Preg, Serum NEGATIVE  NEGATIVE   Comment:            THE SENSITIVITY OF THIS     METHODOLOGY IS >10 mIU/mL.  PROTIME-INR     Status: None   Collection Time    11/26/13  7:58 AM      Result Value Ref Range   Prothrombin Time 13.1  11.6 - 15.2 seconds   INR 0.99  0.00 - 1.49    Imaging: No results found.  Assessment/Plan Uterine fibroids with menorrhagia  For [place] procedure today with plans for overnight observation. Procedure, risks, complications, use of sedation reviewed. Consent signed in chart  Ascencion Dike PA-C 11/26/2013, 9:10 AM

## 2013-11-26 NOTE — Procedures (Signed)
Post UFE.  No immediate complications.  Keep right leg straight for 4 hrs.   

## 2013-11-26 NOTE — Sedation Documentation (Signed)
5Fr sheath removed from R femoral artery by Dr. Pascal Lux. Hemostasis achieved using manual pressure.  R groin level 0, 2+RDP.

## 2013-11-27 ENCOUNTER — Other Ambulatory Visit: Payer: Self-pay | Admitting: Radiology

## 2013-11-27 DIAGNOSIS — D259 Leiomyoma of uterus, unspecified: Secondary | ICD-10-CM

## 2013-11-27 MED ORDER — IBUPROFEN 200 MG PO TABS: 600.0000 mg | ORAL_TABLET | Freq: Four times a day (QID) | ORAL | Status: DC | PRN

## 2013-11-27 MED ORDER — IBUPROFEN 600 MG PO TABS
600.0000 mg | ORAL_TABLET | Freq: Once | ORAL | Status: AC
  Administered 2013-11-27: 600 mg via ORAL
  Filled 2013-11-27: qty 3
  Filled 2013-11-27: qty 1

## 2013-11-27 MED ORDER — KETOROLAC TROMETHAMINE 30 MG/ML IJ SOLN
30.0000 mg | Freq: Four times a day (QID) | INTRAMUSCULAR | Status: DC | PRN
  Administered 2013-11-27: 30 mg via INTRAVENOUS
  Filled 2013-11-27 (×2): qty 1

## 2013-11-27 MED ORDER — OXYCODONE HCL 5 MG PO TABS: 5.0000 mg | ORAL_TABLET | ORAL | Status: DC | PRN

## 2013-11-27 MED ORDER — PROMETHAZINE HCL 12.5 MG PO TABS: 12.5000 mg | ORAL_TABLET | Freq: Four times a day (QID) | ORAL | Status: DC | PRN

## 2013-11-27 MED ORDER — PROMETHAZINE HCL 25 MG PO TABS
12.5000 mg | ORAL_TABLET | Freq: Four times a day (QID) | ORAL | Status: DC | PRN
  Filled 2013-11-27: qty 1

## 2013-11-27 MED ORDER — DOCUSATE SODIUM 100 MG PO CAPS: 100.0000 mg | ORAL_CAPSULE | Freq: Two times a day (BID) | ORAL | Status: DC

## 2013-11-27 NOTE — Progress Notes (Signed)
Referring Physician(s): Dr. Garwin Brothers  Subjective: Patient is s/p [place] 9/15 c/o 5/10 lower pelvic pain and blood in toilet, however is on her menstrual cycle currently. She also c/o nausea without vomiting and was able to eat dinner and breakfast. She denies any fever or chills.  Allergies: Doxycycline  Medications: Prior to Admission medications   Medication Sig Start Date End Date Taking? Authorizing Provider  ALPRAZolam Duanne Moron) 0.5 MG tablet Take 0.5 mg by mouth as needed for anxiety.   Yes Historical Provider, MD  dexlansoprazole (DEXILANT) 60 MG capsule Take 60 mg by mouth every morning.    Yes Historical Provider, MD  Fe Fum-FePoly-FA-Vit C-Vit B3 (INTEGRA F PO) Take 1 tablet by mouth every morning.    Yes Historical Provider, MD  ibuprofen (ADVIL) 200 MG tablet Take 200 mg by mouth as needed.   Yes Historical Provider, MD  Probiotic Product (ALIGN PO) Take 1 tablet by mouth daily as needed. For digestion   Yes Historical Provider, MD  propranolol ER (INDERAL LA) 80 MG 24 hr capsule Take 80 mg by mouth every morning.   Yes Historical Provider, MD  triamterene-hydrochlorothiazide (MAXZIDE-25) 37.5-25 MG per tablet Take 0.5 tablets by mouth every morning.    Yes Historical Provider, MD    Review of Systems  Constitutional: Negative for fever.  Respiratory: Negative for shortness of breath.   Cardiovascular: Negative for chest pain.  Gastrointestinal: Positive for abdominal pain and abdominal distention.    Vital Signs: BP 104/62  Pulse 68  Temp(Src) 98 F (36.7 C) (Oral)  Resp 16  Ht _0  (1.702 m)  Wt 179 lb (81.194 kg)  BMI 28.03 kg/m2  SpO2 95%  LMP 11/25/2013  Physical Exam General: A&Ox3, NAD  Abd: Soft, distended, lower pelvic tenderness Ext: RCFA dressing intact, soft, NT, no bleeding/hematoma, DP intact 1+ bilaterally, warm bilaterally.   Imaging: Ir Angiogram Pelvis Selective Or Supraselective  11/26/2013   INDICATION: Symptomatic uterine fibroids.  EXAM:  1. ULTRASOUND GUIDANCE FOR ARTERIAL ACCESS. 2. BILATERAL INTERNAL ILIAC ARTERIOGRAM. 3. SELECTIVE RIGHT UTERINE ARTERY ARTERIOGRAM AND PERCUTANEOUS PARTICLE EMBOLIZATION. 4. 3D VASCULAR RECONSTRUCTION  COMPARISON:  Contrast-enhanced pelvic MRI - 08/07/2013  MEDICATIONS: Fentanyl 300 mcg IV; Versed 9 mg IV; Toradol 30 mg IV; Dilaudid 0.5 mg IV; Zofran 4 mg IV; Ancef 2 gm IV; The antibiotic was administered within one hour of the procedure  ANESTHESIA/SEDATION: Sedation time  2 hours, 45 minutes  CONTRAST:  170 cc Omnipaque 350  FLUOROSCOPY TIME:  58 minutes.  30 seconds.  COMPLICATIONS: None immediate  PROCEDURE: Informed consent was obtained from the patient following explanation of the procedure, risks, benefits and alternatives. The patient understands, agrees and consents for the procedure. All questions were addressed. A time out was performed prior to the initiation of the procedure. Maximal barrier sterile technique utilized including caps, mask, sterile gowns, sterile gloves, large sterile drape, hand hygiene, and Betadine prep.  The right femoral head was marked fluoroscopically. Under sterile conditions and local anesthesia, the right common femoral artery access was performed with a micropuncture needle. Under direct ultrasound guidance, the right common femoral was accessed with a micropuncture kit. An ultrasound image was saved for documentation purposes. This allowed for placement of a 5-French vascular sheath. A limited arteriogram was performed through the side arm of the sheath confirming appropriate access within the right common femoral artery.  Over a Bentson wire, a C2 catheter was utilized to select the contralateral left internal iliac artery. The 5-French Robert's uterine catheter  was utilized to select the contralateral left internal iliac artery. Selective left internal iliac angiogram was performed. The tortuous left uterine artery was identified. Despite prolonged efforts, the left uterine  artery could not be selectively catheterize secondary to a 80 mild irregular narrowing of the vessels origin. Despite prolonged efforts, the vessel could not be selectively cannulated and as such, the C2 catheter was removed from the left internal iliac artery and utilized to select the ipsilateral right internal iliac artery.  Right internal iliac arteriogram was performed demonstrating the tortuous origin of the right uterine artery. With the use of a synchro micro wire, a Progreat micro catheter was advanced into the distal aspect of the right uterine artery. Selective right uterine arteriogram was performed. For embolization, 3 vials of 500 - 700 micron and 4 vials of 700 - 900 micron Embospheres were injected into the right uterine artery. Post embolization angiogram confirms complete stasis of the left uterine vascular territory.  Again, the C2 catheter was utilized to select the contralateral left internal iliac artery. A 3D rotational left internal iliac angiogram was performed to help better define the origin of the left uterine artery, however again despite prolonged efforts, the left uterine artery could not be selectively cannulated. At this point, the procedure was terminated.  All wires, catheters and sheaths were removed from the patient. Hemostasis was achieved at the right groin access site with manual compression. The patient tolerated the procedure well without immediate post procedural complication.  FINDINGS: Left internal iliac arteriogram demonstrates a tortuous origin of the left internal iliac artery. There is a mild slightly irregular narrowing involving the origin of the left uterine artery (best seen upon RA0 (-) 35 degree projection, as confirmed with 3D reconstruction angiographic images). The micro wire could only be advanced into the very proximal aspect of the left uterine artery however despite prolonged efforts, could not be advanced beyond this location. As such, selective left  uterine artery particle embolization was not performed.  Selective right uterine arteriogram demonstrates dominant supply to the large fibroid within the right-side of the uterine fundus. Technically successful particle embolization of the right uterine artery. Post embolization angiogram confirms stasis of the right uterine vascular territory.  IMPRESSION: 1. Successful right uterine artery embolization (U [facility] E). 2. Unsuccessful attempted left uterine artery embolization.  PLAN: The patient will be seen in the interventional radiology clinic in 1 month time for follow-up evaluation. I hope that the sub selective embolization of the dominant right uterine artery will result in a clinical success. If however the patient's symptoms persist, I would have a low threshold to repeat a contrast-enhanced pelvic MRI.   Electronically Signed   By: Sandi Mariscal M.D.   On: 11/26/2013 17:24   Ir Angiogram Pelvis Selective Or Supraselective  11/26/2013   INDICATION: Symptomatic uterine fibroids.  EXAM: 1. ULTRASOUND GUIDANCE FOR ARTERIAL ACCESS. 2. BILATERAL INTERNAL ILIAC ARTERIOGRAM. 3. SELECTIVE RIGHT UTERINE ARTERY ARTERIOGRAM AND PERCUTANEOUS PARTICLE EMBOLIZATION. 4. 3D VASCULAR RECONSTRUCTION  COMPARISON:  Contrast-enhanced pelvic MRI - 08/07/2013  MEDICATIONS: Fentanyl 300 mcg IV; Versed 9 mg IV; Toradol 30 mg IV; Dilaudid 0.5 mg IV; Zofran 4 mg IV; Ancef 2 gm IV; The antibiotic was administered within one hour of the procedure  ANESTHESIA/SEDATION: Sedation time  2 hours, 45 minutes  CONTRAST:  170 cc Omnipaque 350  FLUOROSCOPY TIME:  58 minutes.  30 seconds.  COMPLICATIONS: None immediate  PROCEDURE: Informed consent was obtained from the patient following explanation of the procedure, risks,  benefits and alternatives. The patient understands, agrees and consents for the procedure. All questions were addressed. A time out was performed prior to the initiation of the procedure. Maximal barrier sterile technique utilized  including caps, mask, sterile gowns, sterile gloves, large sterile drape, hand hygiene, and Betadine prep.  The right femoral head was marked fluoroscopically. Under sterile conditions and local anesthesia, the right common femoral artery access was performed with a micropuncture needle. Under direct ultrasound guidance, the right common femoral was accessed with a micropuncture kit. An ultrasound image was saved for documentation purposes. This allowed for placement of a 5-French vascular sheath. A limited arteriogram was performed through the side arm of the sheath confirming appropriate access within the right common femoral artery.  Over a Bentson wire, a C2 catheter was utilized to select the contralateral left internal iliac artery. The 5-French Robert's uterine catheter was utilized to select the contralateral left internal iliac artery. Selective left internal iliac angiogram was performed. The tortuous left uterine artery was identified. Despite prolonged efforts, the left uterine artery could not be selectively catheterize secondary to a 80 mild irregular narrowing of the vessels origin. Despite prolonged efforts, the vessel could not be selectively cannulated and as such, the C2 catheter was removed from the left internal iliac artery and utilized to select the ipsilateral right internal iliac artery.  Right internal iliac arteriogram was performed demonstrating the tortuous origin of the right uterine artery. With the use of a synchro micro wire, a Progreat micro catheter was advanced into the distal aspect of the right uterine artery. Selective right uterine arteriogram was performed. For embolization, 3 vials of 500 - 700 micron and 4 vials of 700 - 900 micron Embospheres were injected into the right uterine artery. Post embolization angiogram confirms complete stasis of the left uterine vascular territory.  Again, the C2 catheter was utilized to select the contralateral left internal iliac artery. A  3D rotational left internal iliac angiogram was performed to help better define the origin of the left uterine artery, however again despite prolonged efforts, the left uterine artery could not be selectively cannulated. At this point, the procedure was terminated.  All wires, catheters and sheaths were removed from the patient. Hemostasis was achieved at the right groin access site with manual compression. The patient tolerated the procedure well without immediate post procedural complication.  FINDINGS: Left internal iliac arteriogram demonstrates a tortuous origin of the left internal iliac artery. There is a mild slightly irregular narrowing involving the origin of the left uterine artery (best seen upon RA0 (-) 35 degree projection, as confirmed with 3D reconstruction angiographic images). The micro wire could only be advanced into the very proximal aspect of the left uterine artery however despite prolonged efforts, could not be advanced beyond this location. As such, selective left uterine artery particle embolization was not performed.  Selective right uterine arteriogram demonstrates dominant supply to the large fibroid within the right-side of the uterine fundus. Technically successful particle embolization of the right uterine artery. Post embolization angiogram confirms stasis of the right uterine vascular territory.  IMPRESSION: 1. Successful right uterine artery embolization (U [facility] E). 2. Unsuccessful attempted left uterine artery embolization.  PLAN: The patient will be seen in the interventional radiology clinic in 1 month time for follow-up evaluation. I hope that the sub selective embolization of the dominant right uterine artery will result in a clinical success. If however the patient's symptoms persist, I would have a low threshold to repeat a contrast-enhanced  pelvic MRI.   Electronically Signed   By: Sandi Mariscal M.D.   On: 11/26/2013 17:24   Ir Angiogram Selective Each Additional  Vessel  11/26/2013   INDICATION: Symptomatic uterine fibroids.  EXAM: 1. ULTRASOUND GUIDANCE FOR ARTERIAL ACCESS. 2. BILATERAL INTERNAL ILIAC ARTERIOGRAM. 3. SELECTIVE RIGHT UTERINE ARTERY ARTERIOGRAM AND PERCUTANEOUS PARTICLE EMBOLIZATION. 4. 3D VASCULAR RECONSTRUCTION  COMPARISON:  Contrast-enhanced pelvic MRI - 08/07/2013  MEDICATIONS: Fentanyl 300 mcg IV; Versed 9 mg IV; Toradol 30 mg IV; Dilaudid 0.5 mg IV; Zofran 4 mg IV; Ancef 2 gm IV; The antibiotic was administered within one hour of the procedure  ANESTHESIA/SEDATION: Sedation time  2 hours, 45 minutes  CONTRAST:  170 cc Omnipaque 350  FLUOROSCOPY TIME:  58 minutes.  30 seconds.  COMPLICATIONS: None immediate  PROCEDURE: Informed consent was obtained from the patient following explanation of the procedure, risks, benefits and alternatives. The patient understands, agrees and consents for the procedure. All questions were addressed. A time out was performed prior to the initiation of the procedure. Maximal barrier sterile technique utilized including caps, mask, sterile gowns, sterile gloves, large sterile drape, hand hygiene, and Betadine prep.  The right femoral head was marked fluoroscopically. Under sterile conditions and local anesthesia, the right common femoral artery access was performed with a micropuncture needle. Under direct ultrasound guidance, the right common femoral was accessed with a micropuncture kit. An ultrasound image was saved for documentation purposes. This allowed for placement of a 5-French vascular sheath. A limited arteriogram was performed through the side arm of the sheath confirming appropriate access within the right common femoral artery.  Over a Bentson wire, a C2 catheter was utilized to select the contralateral left internal iliac artery. The 5-French Robert's uterine catheter was utilized to select the contralateral left internal iliac artery. Selective left internal iliac angiogram was performed. The tortuous left  uterine artery was identified. Despite prolonged efforts, the left uterine artery could not be selectively catheterize secondary to a 80 mild irregular narrowing of the vessels origin. Despite prolonged efforts, the vessel could not be selectively cannulated and as such, the C2 catheter was removed from the left internal iliac artery and utilized to select the ipsilateral right internal iliac artery.  Right internal iliac arteriogram was performed demonstrating the tortuous origin of the right uterine artery. With the use of a synchro micro wire, a Progreat micro catheter was advanced into the distal aspect of the right uterine artery. Selective right uterine arteriogram was performed. For embolization, 3 vials of 500 - 700 micron and 4 vials of 700 - 900 micron Embospheres were injected into the right uterine artery. Post embolization angiogram confirms complete stasis of the left uterine vascular territory.  Again, the C2 catheter was utilized to select the contralateral left internal iliac artery. A 3D rotational left internal iliac angiogram was performed to help better define the origin of the left uterine artery, however again despite prolonged efforts, the left uterine artery could not be selectively cannulated. At this point, the procedure was terminated.  All wires, catheters and sheaths were removed from the patient. Hemostasis was achieved at the right groin access site with manual compression. The patient tolerated the procedure well without immediate post procedural complication.  FINDINGS: Left internal iliac arteriogram demonstrates a tortuous origin of the left internal iliac artery. There is a mild slightly irregular narrowing involving the origin of the left uterine artery (best seen upon RA0 (-) 35 degree projection, as confirmed with 3D reconstruction angiographic images). The  micro wire could only be advanced into the very proximal aspect of the left uterine artery however despite prolonged  efforts, could not be advanced beyond this location. As such, selective left uterine artery particle embolization was not performed.  Selective right uterine arteriogram demonstrates dominant supply to the large fibroid within the right-side of the uterine fundus. Technically successful particle embolization of the right uterine artery. Post embolization angiogram confirms stasis of the right uterine vascular territory.  IMPRESSION: 1. Successful right uterine artery embolization (U F E). 2. Unsuccessful attempted left uterine artery embolization.  PLAN: The patient will be seen in the interventional radiology clinic in 1 month time for follow-up evaluation. I hope that the sub selective embolization of the dominant right uterine artery will result in a clinical success. If however the patient's symptoms persist, I would have a low threshold to repeat a contrast-enhanced pelvic MRI.   Electronically Signed   By: Sandi Mariscal M.D.   On: 11/26/2013 17:24   Ir 3d Primitivo Gauze Darreld Mclean  11/26/2013   INDICATION: Symptomatic uterine fibroids.  EXAM: 1. ULTRASOUND GUIDANCE FOR ARTERIAL ACCESS. 2. BILATERAL INTERNAL ILIAC ARTERIOGRAM. 3. SELECTIVE RIGHT UTERINE ARTERY ARTERIOGRAM AND PERCUTANEOUS PARTICLE EMBOLIZATION. 4. 3D VASCULAR RECONSTRUCTION  COMPARISON:  Contrast-enhanced pelvic MRI - 08/07/2013  MEDICATIONS: Fentanyl 300 mcg IV; Versed 9 mg IV; Toradol 30 mg IV; Dilaudid 0.5 mg IV; Zofran 4 mg IV; Ancef 2 gm IV; The antibiotic was administered within one hour of the procedure  ANESTHESIA/SEDATION: Sedation time  2 hours, 45 minutes  CONTRAST:  170 cc Omnipaque 350  FLUOROSCOPY TIME:  58 minutes.  30 seconds.  COMPLICATIONS: None immediate  PROCEDURE: Informed consent was obtained from the patient following explanation of the procedure, risks, benefits and alternatives. The patient understands, agrees and consents for the procedure. All questions were addressed. A time out was performed prior to the initiation of the  procedure. Maximal barrier sterile technique utilized including caps, mask, sterile gowns, sterile gloves, large sterile drape, hand hygiene, and Betadine prep.  The right femoral head was marked fluoroscopically. Under sterile conditions and local anesthesia, the right common femoral artery access was performed with a micropuncture needle. Under direct ultrasound guidance, the right common femoral was accessed with a micropuncture kit. An ultrasound image was saved for documentation purposes. This allowed for placement of a 5-French vascular sheath. A limited arteriogram was performed through the side arm of the sheath confirming appropriate access within the right common femoral artery.  Over a Bentson wire, a C2 catheter was utilized to select the contralateral left internal iliac artery. The 5-French Robert's uterine catheter was utilized to select the contralateral left internal iliac artery. Selective left internal iliac angiogram was performed. The tortuous left uterine artery was identified. Despite prolonged efforts, the left uterine artery could not be selectively catheterize secondary to a 80 mild irregular narrowing of the vessels origin. Despite prolonged efforts, the vessel could not be selectively cannulated and as such, the C2 catheter was removed from the left internal iliac artery and utilized to select the ipsilateral right internal iliac artery.  Right internal iliac arteriogram was performed demonstrating the tortuous origin of the right uterine artery. With the use of a synchro micro wire, a Progreat micro catheter was advanced into the distal aspect of the right uterine artery. Selective right uterine arteriogram was performed. For embolization, 3 vials of 500 - 700 micron and 4 vials of 700 - 900 micron Embospheres were injected into the right uterine artery. Post embolization  angiogram confirms complete stasis of the left uterine vascular territory.  Again, the C2 catheter was utilized to  select the contralateral left internal iliac artery. A 3D rotational left internal iliac angiogram was performed to help better define the origin of the left uterine artery, however again despite prolonged efforts, the left uterine artery could not be selectively cannulated. At this point, the procedure was terminated.  All wires, catheters and sheaths were removed from the patient. Hemostasis was achieved at the right groin access site with manual compression. The patient tolerated the procedure well without immediate post procedural complication.  FINDINGS: Left internal iliac arteriogram demonstrates a tortuous origin of the left internal iliac artery. There is a mild slightly irregular narrowing involving the origin of the left uterine artery (best seen upon RA0 (-) 35 degree projection, as confirmed with 3D reconstruction angiographic images). The micro wire could only be advanced into the very proximal aspect of the left uterine artery however despite prolonged efforts, could not be advanced beyond this location. As such, selective left uterine artery particle embolization was not performed.  Selective right uterine arteriogram demonstrates dominant supply to the large fibroid within the right-side of the uterine fundus. Technically successful particle embolization of the right uterine artery. Post embolization angiogram confirms stasis of the right uterine vascular territory.  IMPRESSION: 1. Successful right uterine artery embolization (U F E). 2. Unsuccessful attempted left uterine artery embolization.  PLAN: The patient will be seen in the interventional radiology clinic in 1 month time for follow-up evaluation. I hope that the sub selective embolization of the dominant right uterine artery will result in a clinical success. If however the patient's symptoms persist, I would have a low threshold to repeat a contrast-enhanced pelvic MRI.   Electronically Signed   By: Sandi Mariscal M.D.   On: 11/26/2013 17:24    Ir US Guide Vasc Access Right  11/26/2013   INDICATION: Symptomatic uterine fibroids.  EXAM: 1. ULTRASOUND GUIDANCE FOR ARTERIAL ACCESS. 2. BILATERAL INTERNAL ILIAC ARTERIOGRAM. 3. SELECTIVE RIGHT UTERINE ARTERY ARTERIOGRAM AND PERCUTANEOUS PARTICLE EMBOLIZATION. 4. 3D VASCULAR RECONSTRUCTION  COMPARISON:  Contrast-enhanced pelvic MRI - 08/07/2013  MEDICATIONS: Fentanyl 300 mcg IV; Versed 9 mg IV; Toradol 30 mg IV; Dilaudid 0.5 mg IV; Zofran 4 mg IV; Ancef 2 gm IV; The antibiotic was administered within one hour of the procedure  ANESTHESIA/SEDATION: Sedation time  2 hours, 45 minutes  CONTRAST:  170 cc Omnipaque 350  FLUOROSCOPY TIME:  58 minutes.  30 seconds.  COMPLICATIONS: None immediate  PROCEDURE: Informed consent was obtained from the patient following explanation of the procedure, risks, benefits and alternatives. The patient understands, agrees and consents for the procedure. All questions were addressed. A time out was performed prior to the initiation of the procedure. Maximal barrier sterile technique utilized including caps, mask, sterile gowns, sterile gloves, large sterile drape, hand hygiene, and Betadine prep.  The right femoral head was marked fluoroscopically. Under sterile conditions and local anesthesia, the right common femoral artery access was performed with a micropuncture needle. Under direct ultrasound guidance, the right common femoral was accessed with a micropuncture kit. An ultrasound image was saved for documentation purposes. This allowed for placement of a 5-French vascular sheath. A limited arteriogram was performed through the side arm of the sheath confirming appropriate access within the right common femoral artery.  Over a Bentson wire, a C2 catheter was utilized to select the contralateral left internal iliac artery. The 5-French Robert's uterine catheter was utilized to select  the contralateral left internal iliac artery. Selective left internal iliac angiogram was  performed. The tortuous left uterine artery was identified. Despite prolonged efforts, the left uterine artery could not be selectively catheterize secondary to a 80 mild irregular narrowing of the vessels origin. Despite prolonged efforts, the vessel could not be selectively cannulated and as such, the C2 catheter was removed from the left internal iliac artery and utilized to select the ipsilateral right internal iliac artery.  Right internal iliac arteriogram was performed demonstrating the tortuous origin of the right uterine artery. With the use of a synchro micro wire, a Progreat micro catheter was advanced into the distal aspect of the right uterine artery. Selective right uterine arteriogram was performed. For embolization, 3 vials of 500 - 700 micron and 4 vials of 700 - 900 micron Embospheres were injected into the right uterine artery. Post embolization angiogram confirms complete stasis of the left uterine vascular territory.  Again, the C2 catheter was utilized to select the contralateral left internal iliac artery. A 3D rotational left internal iliac angiogram was performed to help better define the origin of the left uterine artery, however again despite prolonged efforts, the left uterine artery could not be selectively cannulated. At this point, the procedure was terminated.  All wires, catheters and sheaths were removed from the patient. Hemostasis was achieved at the right groin access site with manual compression. The patient tolerated the procedure well without immediate post procedural complication.  FINDINGS: Left internal iliac arteriogram demonstrates a tortuous origin of the left internal iliac artery. There is a mild slightly irregular narrowing involving the origin of the left uterine artery (best seen upon RA0 (-) 35 degree projection, as confirmed with 3D reconstruction angiographic images). The micro wire could only be advanced into the very proximal aspect of the left uterine artery  however despite prolonged efforts, could not be advanced beyond this location. As such, selective left uterine artery particle embolization was not performed.  Selective right uterine arteriogram demonstrates dominant supply to the large fibroid within the right-side of the uterine fundus. Technically successful particle embolization of the right uterine artery. Post embolization angiogram confirms stasis of the right uterine vascular territory.  IMPRESSION: 1. Successful right uterine artery embolization (U F E). 2. Unsuccessful attempted left uterine artery embolization.  PLAN: The patient will be seen in the interventional radiology clinic in 1 month time for follow-up evaluation. I hope that the sub selective embolization of the dominant right uterine artery will result in a clinical success. If however the patient's symptoms persist, I would have a low threshold to repeat a contrast-enhanced pelvic MRI.   Electronically Signed   By: Sandi Mariscal M.D.   On: 11/26/2013 17:24   Ir Embo Tumor Organ Ischemia Infarct Inc Guide Roadmapping  11/26/2013   INDICATION: Symptomatic uterine fibroids.  EXAM: 1. ULTRASOUND GUIDANCE FOR ARTERIAL ACCESS. 2. BILATERAL INTERNAL ILIAC ARTERIOGRAM. 3. SELECTIVE RIGHT UTERINE ARTERY ARTERIOGRAM AND PERCUTANEOUS PARTICLE EMBOLIZATION. 4. 3D VASCULAR RECONSTRUCTION  COMPARISON:  Contrast-enhanced pelvic MRI - 08/07/2013  MEDICATIONS: Fentanyl 300 mcg IV; Versed 9 mg IV; Toradol 30 mg IV; Dilaudid 0.5 mg IV; Zofran 4 mg IV; Ancef 2 gm IV; The antibiotic was administered within one hour of the procedure  ANESTHESIA/SEDATION: Sedation time  2 hours, 45 minutes  CONTRAST:  170 cc Omnipaque 350  FLUOROSCOPY TIME:  58 minutes.  30 seconds.  COMPLICATIONS: None immediate  PROCEDURE: Informed consent was obtained from the patient following explanation of the procedure, risks, benefits  and alternatives. The patient understands, agrees and consents for the procedure. All questions were  addressed. A time out was performed prior to the initiation of the procedure. Maximal barrier sterile technique utilized including caps, mask, sterile gowns, sterile gloves, large sterile drape, hand hygiene, and Betadine prep.  The right femoral head was marked fluoroscopically. Under sterile conditions and local anesthesia, the right common femoral artery access was performed with a micropuncture needle. Under direct ultrasound guidance, the right common femoral was accessed with a micropuncture kit. An ultrasound image was saved for documentation purposes. This allowed for placement of a 5-French vascular sheath. A limited arteriogram was performed through the side arm of the sheath confirming appropriate access within the right common femoral artery.  Over a Bentson wire, a C2 catheter was utilized to select the contralateral left internal iliac artery. The 5-French Robert's uterine catheter was utilized to select the contralateral left internal iliac artery. Selective left internal iliac angiogram was performed. The tortuous left uterine artery was identified. Despite prolonged efforts, the left uterine artery could not be selectively catheterize secondary to a 80 mild irregular narrowing of the vessels origin. Despite prolonged efforts, the vessel could not be selectively cannulated and as such, the C2 catheter was removed from the left internal iliac artery and utilized to select the ipsilateral right internal iliac artery.  Right internal iliac arteriogram was performed demonstrating the tortuous origin of the right uterine artery. With the use of a synchro micro wire, a Progreat micro catheter was advanced into the distal aspect of the right uterine artery. Selective right uterine arteriogram was performed. For embolization, 3 vials of 500 - 700 micron and 4 vials of 700 - 900 micron Embospheres were injected into the right uterine artery. Post embolization angiogram confirms complete stasis of the left  uterine vascular territory.  Again, the C2 catheter was utilized to select the contralateral left internal iliac artery. A 3D rotational left internal iliac angiogram was performed to help better define the origin of the left uterine artery, however again despite prolonged efforts, the left uterine artery could not be selectively cannulated. At this point, the procedure was terminated.  All wires, catheters and sheaths were removed from the patient. Hemostasis was achieved at the right groin access site with manual compression. The patient tolerated the procedure well without immediate post procedural complication.  FINDINGS: Left internal iliac arteriogram demonstrates a tortuous origin of the left internal iliac artery. There is a mild slightly irregular narrowing involving the origin of the left uterine artery (best seen upon RA0 (-) 35 degree projection, as confirmed with 3D reconstruction angiographic images). The micro wire could only be advanced into the very proximal aspect of the left uterine artery however despite prolonged efforts, could not be advanced beyond this location. As such, selective left uterine artery particle embolization was not performed.  Selective right uterine arteriogram demonstrates dominant supply to the large fibroid within the right-side of the uterine fundus. Technically successful particle embolization of the right uterine artery. Post embolization angiogram confirms stasis of the right uterine vascular territory.  IMPRESSION: 1. Successful right uterine artery embolization (U F E). 2. Unsuccessful attempted left uterine artery embolization.  PLAN: The patient will be seen in the interventional radiology clinic in 1 month time for follow-up evaluation. I hope that the sub selective embolization of the dominant right uterine artery will result in a clinical success. If however the patient's symptoms persist, I would have a low threshold to repeat a contrast-enhanced pelvic  MRI.    Electronically Signed   By: Sandi Mariscal M.D.   On: 11/26/2013 17:24    Labs: Results for orders placed during the hospital encounter of 11/26/13 (from the past 48 hour(s))  APTT     Status: None   Collection Time    11/26/13  7:58 AM      Result Value Ref Range   aPTT 31  24 - 37 seconds  BASIC METABOLIC PANEL     Status: Abnormal   Collection Time    11/26/13  7:58 AM      Result Value Ref Range   Sodium 139  137 - 147 mEq/L   Potassium 3.8  3.7 - 5.3 mEq/L   Chloride 100  96 - 112 mEq/L   CO2 24  19 - 32 mEq/L   Glucose, Bld 101 (*) 70 - 99 mg/dL   BUN 13  6 - 23 mg/dL   Creatinine, Ser 0.68  0.50 - 1.10 mg/dL   Calcium 9.4  8.4 - 10.5 mg/dL   GFR calc non Af Amer >90  >90 mL/min   GFR calc Af Amer >90  >90 mL/min   Comment: (NOTE)     The eGFR has been calculated using the CKD EPI equation.     This calculation has not been validated in all clinical situations.     eGFR's persistently <90 mL/min signify possible Chronic Kidney     Disease.   Anion gap 15  5 - 15  CBC WITH DIFFERENTIAL     Status: Abnormal   Collection Time    11/26/13  7:58 AM      Result Value Ref Range   WBC 4.4  4.0 - 10.5 K/uL   RBC 3.93  3.87 - 5.11 MIL/uL   Hemoglobin 12.3  12.0 - 15.0 g/dL   HCT 36.8  36.0 - 46.0 %   MCV 93.6  78.0 - 100.0 fL   MCH 31.3  26.0 - 34.0 pg   MCHC 33.4  30.0 - 36.0 g/dL   RDW 15.2  11.5 - 15.5 %   Platelets 313  150 - 400 K/uL   Neutrophils Relative % 48  43 - 77 %   Neutro Abs 2.1  1.7 - 7.7 K/uL   Lymphocytes Relative 35  12 - 46 %   Lymphs Abs 1.5  0.7 - 4.0 K/uL   Monocytes Relative 14 (*) 3 - 12 %   Monocytes Absolute 0.6  0.1 - 1.0 K/uL   Eosinophils Relative 2  0 - 5 %   Eosinophils Absolute 0.1  0.0 - 0.7 K/uL   Basophils Relative 1  0 - 1 %   Basophils Absolute 0.0  0.0 - 0.1 K/uL  HCG, SERUM, QUALITATIVE     Status: None   Collection Time    11/26/13  7:58 AM      Result Value Ref Range   Preg, Serum NEGATIVE  NEGATIVE   Comment:             THE SENSITIVITY OF THIS     METHODOLOGY IS >10 mIU/mL.  PROTIME-INR     Status: None   Collection Time    11/26/13  7:58 AM      Result Value Ref Range   Prothrombin Time 13.1  11.6 - 15.2 seconds   INR 0.99  0.00 - 1.49    Assessment and Plan: Uterine fibroids s/p successful [place] 9/15 Pain, discontinue PCA and start PO medication Nausea without  vomiting, will give phenergan now Will evaluate in early afternoon for possible discharge today. D/w Dr. Susa Day PA-C Interventional Radiology  11/27/13  9:18 AM

## 2013-11-27 NOTE — Discharge Summary (Signed)
Physician Discharge Summary  Patient ID: Kimberly Boyd MRN: [id number] DOB/AGE: 1966/02/13 48 y.o.  Admit date: 11/26/2013 Discharge date: 11/27/2013  Admission Diagnoses: Symptomatic uterine fibroids  Discharge Diagnoses:  Uterine fibroids s/p successful uterine fibroid embolization.    Procedures: Uterine fibroid embolization.  Discharged Condition: Good, stable.   Hospital Course: Kimberly Boyd is an 48 y.o. female with hx of menorrhagia and uterine fibroids. She has been seen in consult by Dr. Pascal Lux for consideration of uterine artery embolization. See PACS imaging for full consult report. She presented 11/26/13 as an outpatient for an elective UFE. She underwent the procedure without complications and was admitted overnight for observation. She does c/o nausea and 5/10 lower pelvic cramping today. She denies any vomiting and was able to eat dinner last evening and breakfast today. She denies any right access groin site pain, swelling or bleeding. She denies any chest pain, shortness of breath, fever or chills. She has urinated and does have some blood spotting lightly on a pad today, she is currently on her menstrual cycle and states this spotting is less than usual. She has ambulated this morning without complaints of lightheadedness or dizziness. Her family is present in the room and the patient feels ready for discharge home. She was given discharge instructions and prescriptions with instructions. Patient is deemed stable for discharge and will follow up in IR clinic in 4 weeks.   Consults: None.  Discharge Exam: Blood pressure 104/62, pulse 68, temperature 98 F (36.7 C), temperature source Oral, resp. rate 16, height 5\' 7"  (1.702 m), weight 179 lb (81.194 kg), last menstrual period 11/25/2013, SpO2 95.00%.  General: A&Ox3, NAD, sitting up in bed Heart: RRR without m/g/r Lungs: CTA bilaterally without w/r/r Abd: Soft, ND, (+) BS, tenderness lower pelvic region Ext: RCFA access  site with Band-Aid intact no bleeding, hematoma or skin changes. DP intact 1+ bilaterally.   Disposition: 01-Home or Self Care      Discharge Instructions   Call MD for:  difficulty breathing, headache or visual disturbances    Complete by:  As directed      Call MD for:  extreme fatigue    Complete by:  As directed      Call MD for:  hives    Complete by:  As directed      Call MD for:  persistant dizziness or light-headedness    Complete by:  As directed      Call MD for:  persistant nausea and vomiting    Complete by:  As directed      Call MD for:  redness, tenderness, or signs of infection (pain, swelling, redness, odor or Bremer/yellow discharge around incision site)    Complete by:  As directed      Call MD for:  severe uncontrolled pain    Complete by:  As directed      Call MD for:  temperature >100.4    Complete by:  As directed      Diet - low sodium heart healthy    Complete by:  As directed      Driving Restrictions    Complete by:  As directed   No driving x 2 days     Increase activity slowly    Complete by:  As directed      Lifting restrictions    Complete by:  As directed   No lifting > 15 lbs x 3 days     Remove dressing in 24 hours  Complete by:  As directed             Medication List         ALIGN PO  Take 1 tablet by mouth daily as needed. For digestion     ALPRAZolam 0.5 MG tablet  Commonly known as:  XANAX  Take 0.5 mg by mouth as needed for anxiety.     dexlansoprazole 60 MG capsule  Commonly known as:  DEXILANT  Take 60 mg by mouth every morning.     docusate sodium 100 MG capsule  Commonly known as:  COLACE  Take 1 capsule (100 mg total) by mouth 2 (two) times daily.     ibuprofen 200 MG tablet  Commonly known as:  ADVIL  Take 3 tablets (600 mg total) by mouth every 6 (six) hours as needed. Take every 6 hrs for first 48 hours then as needed for pain     INTEGRA F PO  Take 1 tablet by mouth every morning.     oxyCODONE 5 MG  immediate release tablet  Commonly known as:  Oxy IR/ROXICODONE  Take 1 tablet (5 mg total) by mouth every 4 (four) hours as needed for severe pain.     promethazine 12.5 MG tablet  Commonly known as:  PHENERGAN  Take 1 tablet (12.5 mg total) by mouth every 6 (six) hours as needed for nausea.     propranolol ER 80 MG 24 hr capsule  Commonly known as:  INDERAL LA  Take 80 mg by mouth every morning.     triamterene-hydrochlorothiazide 37.5-25 MG per tablet  Commonly known as:  MAXZIDE-25  Take 0.5 tablets by mouth every morning.       Follow-up Information   Follow up with Sandi Mariscal, MD In 4 weeks. (Our office will call with appointment [phone])    Specialty:  Interventional Radiology   Contact information:   [address] [place] [address] [phone]       Signed: Rockney Ghee 11/27/2013, 12:09 PM

## 2013-11-27 NOTE — Progress Notes (Signed)
Patient complaining of abdominal cramping overnight. States that the PCA pain medication is not working to relieve her pain. On call physician contacted. Toradol IV 30 mg PRN Q6 hours added. Sherre Poot D, RN

## 2013-11-27 NOTE — Progress Notes (Signed)
Nursing Discharge Summary  Patient ID: Kimberly Boyd MRN: [id number] DOB/AGE: 48-Feb-1967 48 y.o.  Admit date: 11/26/2013 Discharge date: 11/27/2013  Discharged Condition: good  Disposition: 01-Home or Self Care    Prescriptions Given: Given to pt by MD  Means of Discharge: Discharge home via car with husband  Signed: Juanetta Snow 11/27/2013, 12:02 PM

## 2013-11-27 NOTE — Progress Notes (Signed)
Utilization Review Completed.Dowell, Deborah T9/16/2015  

## 2013-12-10 ENCOUNTER — Encounter: Payer: Self-pay | Admitting: Radiology

## 2013-12-19 ENCOUNTER — Other Ambulatory Visit: Payer: Self-pay

## 2013-12-31 ENCOUNTER — Ambulatory Visit
Admission: RE | Admit: 2013-12-31 | Discharge: 2013-12-31 | Disposition: A | Discharge status: Home / Self Care | Payer: 59 | Source: Ambulatory Visit | Attending: Radiology | Admitting: Radiology

## 2013-12-31 DIAGNOSIS — D259 Leiomyoma of uterus, unspecified: Secondary | ICD-10-CM

## 2013-12-31 NOTE — Progress Notes (Signed)
LMP:  12/20/2013.  Patient states that she is continuing to experience minimal spotting.  No foul odor.  Afebrile.  Some cramping.  Returned to work on 12/10/2013.  Overall doing well.  Henri Riki Rusk, RN 12/31/2013 8:47 AM

## 2013-12-31 NOTE — Consult Note (Signed)
Chief Complaint: Chief Complaint  Patient presents with  . Follow-up    4 wk follow up [place]      Referring Physician(s): Glendale Chard (PCP) Garwin Brothers (Ob-GYN)  History of Present Illness: Kimberly Boyd is a 48 y.o. female who underwent a uterine fibroid embolization on 11/26/2013 for symptomatic uterine fibroids.  The patient was initially seen in consultation on 07/01/2012, however after a year of continued observation, returned for consultation on 08/21/2013.   The patient's fibroid related complaints primarily revolves around severe menorrhagia including the past a large amount of blood and blood clots.  Prior to the procedure she stated her cycle has remained regular lasting 6-7 days, however 2-3 days of which are severe necessitating aware of pads as well as depends secondary to her severe menorrhagia.  She also complained of occasional intermittent spotting.  The patient underwent a technically successful right uterine artery embolization on 11/26/2013, however I was unable to successfully catheterize the left uterine artery. The patient tolerated the procedure well without immediate post residual complication and was discharged the following morning without issue.   Approximately 7 days following the procedure, the patient reported a rather acute onset of worsening right-sided pelvic pain diffuse myalgias and low-grade fever. In telephone conversation I had with the patient, I explained that I felt this was most likely related to a delayed post embolization syndrome and recommended continued conservative management, though wrote the patient for a prescription for antibiotics if continued conservative management did not improve her symptoms. The patient states that by the next morning she had fully recovered from this transient episode and as such did not have the prescription filled. She has not experienced any similar episodes.  The patient states she has had one cycle since the  procedure in which she noticed a subjective improvement in the degree of menorrhagia.  She does report occasional intermittent spotting of dark old blood, though denies frankly foul-smelling discharge.  She has noticed a subjective improvement in her lower abdominal/pelvic girth stating that her clothes "fit better."  She denies flank pain, dysuria or hematuria. She denies pelvic pain and has returned to work and all activities of daily living.  Past Medical History  Diagnosis Date  . GERD (gastroesophageal reflux disease)   . Palpitations   . Complication of anesthesia     tachycardia, difficulty waking up  . Heart murmur     questionable  . Hypertension   . Back pain years.     chronic low back pain   . Palpitations 2011    takes  propanolol     Past Surgical History  Procedure Laterality Date  . Laparoscopic cholecystectomy    . Lipoma resection      and redone  . Scar revision for the lipoma    . Dermoid cyst removal    . Cervical dysplasia laser    . Orif ankle fracture  08/09/2011    Procedure: OPEN REDUCTION INTERNAL FIXATION (ORIF) ANKLE FRACTURE;  Surgeon: Wylene Simmer, MD;  Location: [facility];  Service: Orthopedics;  Laterality: Right;  ORIF Right Ankle Lateral Malleolus    Allergies: Doxycycline  Medications: Prior to Admission medications   Medication Sig Start Date End Date Taking? Authorizing Provider  ALPRAZolam Duanne Moron) 0.5 MG tablet Take 0.5 mg by mouth as needed for anxiety.    Historical Provider, MD  dexlansoprazole (DEXILANT) 60 MG capsule Take 60 mg by mouth every morning.     Historical Provider, MD  docusate sodium (COLACE)  100 MG capsule Take 1 capsule (100 mg total) by mouth 2 (two) times daily. 11/27/13   Hedy Jacob, PA-C  Fe Fum-FePoly-FA-Vit C-Vit B3 (INTEGRA F PO) Take 1 tablet by mouth every morning.     Historical Provider, MD  ibuprofen (ADVIL) 200 MG tablet Take 3 tablets (600 mg total) by mouth every 6 (six) hours as needed. Take every 6 hrs for  first 48 hours then as needed for pain 11/27/13   Hedy Jacob, PA-C  oxyCODONE (OXY IR/ROXICODONE) 5 MG immediate release tablet Take 1 tablet (5 mg total) by mouth every 4 (four) hours as needed for severe pain. 11/27/13   Hedy Jacob, PA-C  Probiotic Product (ALIGN PO) Take 1 tablet by mouth daily as needed. For digestion    Historical Provider, MD  promethazine (PHENERGAN) 12.5 MG tablet Take 1 tablet (12.5 mg total) by mouth every 6 (six) hours as needed for nausea. 11/27/13   Hedy Jacob, PA-C  propranolol ER (INDERAL LA) 80 MG 24 hr capsule Take 80 mg by mouth every morning.    Historical Provider, MD  triamterene-hydrochlorothiazide (MAXZIDE-25) 37.5-25 MG per tablet Take 0.5 tablets by mouth every morning.     Historical Provider, MD    No family history on file.  History   Social History  . Marital Status: Married    Spouse Name: N/A    Number of Children: N/A  . Years of Education: N/A   Social History Main Topics  . Smoking status: Never Smoker   . Smokeless tobacco: Never Used  . Alcohol Use: Yes     Comment: rare wine  . Drug Use: No  . Sexual Activity: Yes    Partners: Male   Other Topics Concern  . Not on file   Social History Narrative   Married with 2 children. 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     ECOG Status: 0 - Asymptomatic  Review of Systems: A 12 point ROS discussed and pertinent positives are indicated in the HPI above.  All other systems are negative.  Review of Systems  Constitutional: Negative.   Respiratory: Negative.   Cardiovascular: Negative.   Genitourinary: Positive for vaginal discharge. Negative for urgency, frequency, hematuria, flank pain, decreased urine volume, difficulty urinating, pelvic pain and dyspareunia.  Psychiatric/Behavioral: Negative.     Vital Signs: BP 121/73  Pulse 68  Temp(Src) 98.2 F (36.8 C) (Oral)  Resp 15  SpO2 100%  LMP 12/20/2013  Physical Exam    Constitutional: She appears well-developed and well-nourished.  HENT:  Head: Normocephalic and atraumatic.  Cardiovascular: Normal rate, regular rhythm and intact distal pulses.   Well healed right groin incision site  Pulmonary/Chest: Effort normal and breath sounds normal.  Abdominal: Soft. There is no tenderness.  I am able to palpate her uterine fundus within the right lower abdomen/pelvis at the level of the umbilicus  Psychiatric: She has a normal mood and affect.    Imaging: Selective images from preprocedural MRI performed 08/07/2013 as from the uterine fibroid embolization performed 11/26/2013 were reviewed and discussed with the patient.  Labs:  CBC:  Recent Labs  11/26/13 0758  WBC 4.4  HGB 12.3  HCT 36.8  PLT 313    COAGS:  Recent Labs  11/26/13 0758  INR 0.99  APTT 31    BMP:  Recent Labs  08/01/13 1414 11/26/13 0758  NA  --  139  K  --  3.8  CL  --  100  CO2  --  24  GLUCOSE  --  101*  BUN 12 13  CALCIUM  --  9.4  CREATININE 0.61 0.68  GFRNONAA >89 >90  GFRAA >89 >90   Assessment and Plan:  Kimberly Boyd is a 48 y.o. female who returns today following uterine fibroid embolization performed on 11/26/2013 for symptomatic uterine fibroids.  Despite a transient delayed episode of post embolization syndrome occurring approximately 7 days following the procedure, the patient has recovered uneventfully from the procedure.   Unfortunately, despite prolonged efforts, I was unable to successfully catheterize the left uterine artery and as such, only the right uterine artery was percutaneously particle embolized.  Despite this fact, it appears the right uterine artery may be the dominant supply to the uterus as particle embolization necessitated the usage of approximately 7 vials of Embospheres (typically a uterine fibroid embolization only requires a total of approximately 4 vials for BOTH uterine arteries). As such, I am hopeful that the solitary  right-sided uterine artery embolization will provide the patient with enough subjective relief until the onset of menopause.  This was discussed at length with the patient and selective images from both preprocedural MRI in the uterine embolization were reviewed and discussed with the patient who demonstrated a good understanding.     Fortunately, the patient has already noticed a subjective improvement in the degree of menorrhagia as well as decreased abdominal/pelvic girth. She denies pelvic pain and is returned to all work and activities of daily living.  The patient will be contacted in approximately 3 months to ensure continued subjective improvement and will be scheduled for a follow-up appointment approximately 6 months following the embolization, however she was encouraged to call the IR clinic at anytime with any interval questions or concerns.   A post embolization pelvic MRI will only be obtained if the patient reports persistent fibroid related symptoms.   Thank you for the referral involved in this patient's care.  Ronny Bacon, MD   I spent a total of 25 minutes face to face in clinical consultation, greater than 50% of which was counseling/coordinating care for symptomatic uterine fibroids  Signed: Sandi Mariscal 12/31/2013, 9:11 AM

## 2014-02-03 ENCOUNTER — Telehealth: Payer: Self-pay | Admitting: Internal Medicine

## 2014-02-03 NOTE — Telephone Encounter (Signed)
PT NEEDS REFILL OF PROPANALOL 80 MG UNTIL NEXT APPT 03-19-13

## 2014-02-04 ENCOUNTER — Ambulatory Visit: Payer: Self-pay | Admitting: Internal Medicine

## 2014-02-12 ENCOUNTER — Telehealth: Payer: Self-pay | Admitting: Radiology

## 2014-02-12 NOTE — Telephone Encounter (Signed)
3 mo follow up [place] call for status update.  Some vaginal discharge (with odor) post [place].  Patient states that is does not smell of infection.  Occasional small fragments of tissue discharged.  Reassured patient that this is most likely due to sloughing of fibroid tissue, degenerative changes associated w/ [place].  Patient does not feel that she needs to schedule a 3 mo follow up appointment with Dr Ronny Bacon.  Instructed to call back for concerns or questions.  Will contact patient early 2016 to schedule 6 mo follow up appointment.   Henri Riki Rusk, RN 02/12/2014 11:26 AM

## 2014-03-19 ENCOUNTER — Ambulatory Visit: Payer: Self-pay | Admitting: Internal Medicine

## 2014-03-20 ENCOUNTER — Encounter: Payer: Self-pay | Admitting: Internal Medicine

## 2014-04-24 ENCOUNTER — Other Ambulatory Visit (HOSPITAL_COMMUNITY): Payer: Self-pay | Admitting: Interventional Radiology

## 2014-04-24 DIAGNOSIS — D259 Leiomyoma of uterus, unspecified: Secondary | ICD-10-CM

## 2014-06-04 ENCOUNTER — Ambulatory Visit
Admission: RE | Admit: 2014-06-04 | Discharge: 2014-06-04 | Disposition: A | Discharge status: Home / Self Care | Payer: 59 | Source: Ambulatory Visit | Attending: Interventional Radiology | Admitting: Interventional Radiology

## 2014-06-04 DIAGNOSIS — D259 Leiomyoma of uterus, unspecified: Secondary | ICD-10-CM | POA: Insufficient documentation

## 2014-06-04 HISTORY — PX: IR GENERIC HISTORICAL: IMG1180011

## 2014-06-04 NOTE — Progress Notes (Signed)
Patient ID: Kimberly Boyd, female   DOB: 08-30-1965, 49 y.o.   MRN: [id number]    Chief Complaint: Six-month follow-up post uterine artery embolization  Referring Physician(s): Glendale Chard (PCP) Garwin Brothers (OB-GYN)  History of Present Illness: Kimberly Boyd is a 49 y.o. female with past medical history significant for symptomatic uterine fibroids who underwent a uterine fibroid embolization on 11/26/2013. Patient returns today for six-month postprocedural evaluation. The patient is unaccompanied and serves as her own historian.  Prior to the procedure, the patient reported severe menorrhagia including the passage of blood clots. Her cycles had remained regular lasting 6-7 days, however 2-3 days of which were associate with severe menorrhagia necessitating the frequent changing of pads and depends every 2-3 hours. Patient was noted to be anemic and was initiated on iron supplementation. Patient also complained of bulk-like symptoms including left-sided low back pain for which she at least partially attributes to her fibroid burden.  The patient has recovered uneventfully from the uterine fibroid embolization. Her subsequent cycles have remained regular however no longer associated with menorrhagia. Patient states her cycles are now more "normal" and no longer necessitate wearing of depends or the frequent changing of absorbency devices. The patient also states that her clothes are fitting better and she has noticed a subjective improvement in her low back pain. The patient is without complaint and is overall pleased with the procedure.   Past Medical History  Diagnosis Date  . GERD (gastroesophageal reflux disease)   . Palpitations   . Complication of anesthesia     tachycardia, difficulty waking up  . Heart murmur     questionable  . Hypertension   . Back pain years.     chronic low back pain   . Palpitations 2011    takes  propanolol     Past Surgical History  Procedure Laterality  Date  . Laparoscopic cholecystectomy    . Lipoma resection      and redone  . Scar revision for the lipoma    . Dermoid cyst removal    . Cervical dysplasia laser    . Orif ankle fracture  08/09/2011    Procedure: OPEN REDUCTION INTERNAL FIXATION (ORIF) ANKLE FRACTURE;  Surgeon: Wylene Simmer, MD;  Location: [facility];  Service: Orthopedics;  Laterality: Right;  ORIF Right Ankle Lateral Malleolus    Allergies: Doxycycline  Medications: Prior to Admission medications   Medication Sig Start Date End Date Taking? Authorizing Provider  ALPRAZolam Duanne Moron) 0.5 MG tablet Take 0.5 mg by mouth as needed for anxiety.    Historical Provider, MD  dexlansoprazole (DEXILANT) 60 MG capsule Take 60 mg by mouth every morning.     Historical Provider, MD  docusate sodium (COLACE) 100 MG capsule Take 1 capsule (100 mg total) by mouth 2 (two) times daily. 11/27/13   Hedy Jacob, PA-C  Fe Fum-FePoly-FA-Vit C-Vit B3 (INTEGRA F PO) Take 1 tablet by mouth every morning.     Historical Provider, MD  ibuprofen (ADVIL) 200 MG tablet Take 3 tablets (600 mg total) by mouth every 6 (six) hours as needed. Take every 6 hrs for first 48 hours then as needed for pain 11/27/13   Hedy Jacob, PA-C  oxyCODONE (OXY IR/ROXICODONE) 5 MG immediate release tablet Take 1 tablet (5 mg total) by mouth every 4 (four) hours as needed for severe pain. 11/27/13   Hedy Jacob, PA-C  Probiotic Product (ALIGN PO) Take 1 tablet by mouth daily as needed. For digestion  Historical Provider, MD  promethazine (PHENERGAN) 12.5 MG tablet Take 1 tablet (12.5 mg total) by mouth every 6 (six) hours as needed for nausea. 11/27/13   Hedy Jacob, PA-C  propranolol ER (INDERAL LA) 80 MG 24 hr capsule Take 80 mg by mouth every morning.    Historical Provider, MD  triamterene-hydrochlorothiazide (MAXZIDE-25) 37.5-25 MG per tablet Take 0.5 tablets by mouth every morning.     Historical Provider, MD     No family history on file.  History    Social History  . Marital Status: Married    Spouse Name: N/A  . Number of Children: N/A  . Years of Education: N/A   Social History Main Topics  . Smoking status: Never Smoker   . Smokeless tobacco: Never Used  . Alcohol Use: Yes     Comment: rare wine  . Drug Use: No  . Sexual Activity:    Partners: Male   Other Topics Concern  . Not on file   Social History Narrative   Married with 2 children. 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     ECOG Status: 0 - Asymptomatic  Review of Systems: A 12 point ROS discussed and pertinent positives are indicated in the HPI above.  All other systems are negative.  Review of Systems  Constitutional: Negative.  Negative for fever and fatigue.  Respiratory: Negative.   Cardiovascular: Negative.   Gastrointestinal: Negative.   Genitourinary: Negative for dysuria, urgency, frequency, flank pain, vaginal bleeding, difficulty urinating, vaginal pain, pelvic pain and dyspareunia.  Psychiatric/Behavioral: Negative.      Vital Signs: There were no vitals taken for this visit.  Physical Exam  Constitutional: She appears well-developed and well-nourished.  Cardiovascular: Normal rate and regular rhythm.   Pulmonary/Chest: Effort normal and breath sounds normal.  Abdominal: Soft. Bowel sounds are normal. She exhibits no distension. There is no tenderness.  I am able to palpate the uterine fundus approximately 2 finger breathes below the umbilicus within the right lower abd quadrant, previously at the umbilicus.  Skin: Skin is warm and dry.  Psychiatric: She has a normal mood and affect. Her behavior is normal.  Vitals reviewed.   Imaging:  Contrast enhanced pelvic MRI-/27/2015  Procedural images from uterine fibroid embolization performed 9/15-2015 were reviewed.  Labs:  CBC:  Recent Labs  11/26/13 0758  WBC 4.4  HGB 12.3  HCT 36.8  PLT 313    COAGS:  Recent Labs  11/26/13 0758   INR 0.99  APTT 31    BMP:  Recent Labs  08/01/13 1414 11/26/13 0758  NA  --  139  K  --  3.8  CL  --  100  CO2  --  24  GLUCOSE  --  101*  BUN 12 13  CALCIUM  --  9.4  CREATININE 0.61 0.68  GFRNONAA >89 >90  GFRAA >89 >90    Assessment and Plan:  MALLERIE BLOK is a 49 y.o. female with past medical history significant for symptomatic uterine fibroids who underwent a uterine fibroid embolization on 11/26/2013. Patient returns today for six-month postprocedural evaluation. The patient has recovered uneventfully from the uterine fibroid embolization.  She presents today reporting significant improvement in her preprocedural menorrhagia stating her cycles are now more "normal" and no longer necessitate wearing of Depends or the frequent changing of absorbency devices. The patient also states that her clothes are fitting better and she has noticed a subjective improvement  in her low back pain. The patient is without complaint and is overall pleased with the procedure.  Given the patient's subjective improvement in her menorrhagia symptoms, a post procedural MRI will not be obtained.   She was instructed to call the interventional radiology clinic with any questions or concerns and may return on a PRN basis.  The patient was told she could have an intrauterine device reinserted pending the advice of Dr. Garwin Brothers.  SignedSandi Mariscal 06/04/2014, 9:04 AM   I spent a total of 15 Minutes in face to face in clinical consultation, greater than 50% of which was counseling/coordinating care for symptomatic uterine fibroids, post uterine fibroid embolization.

## 2014-06-16 ENCOUNTER — Ambulatory Visit: Payer: Self-pay | Admitting: Physician Assistant

## 2014-07-06 NOTE — Progress Notes (Deleted)
Cardiology Office Note   Date:  07/06/2014   ID:  Kimberly Boyd, DOB 1965-10-14, MRN [id number]  PCP:  Maximino Greenland, MD  Cardiologist:  Dr. Virl Axe     Chief Complaint  Patient presents with  . 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 LHC in 03/2007 demonstrated normal coronary arteries   Studies/Reports Reviewed Today:  ***  Past Medical History  Diagnosis Date  . GERD (gastroesophageal reflux disease)   . Palpitations   . Complication of anesthesia     tachycardia, difficulty waking up  . Heart murmur     questionable  . Hypertension   . Back pain years.     chronic low back pain   . Palpitations 2011    takes  propanolol     Past Surgical History  Procedure Laterality Date  . Laparoscopic cholecystectomy    . Lipoma resection      and redone  . Scar revision for the lipoma    . Dermoid cyst removal    . Cervical dysplasia laser    . Orif ankle fracture  08/09/2011    Procedure: OPEN REDUCTION INTERNAL FIXATION (ORIF) ANKLE FRACTURE;  Surgeon: Wylene Simmer, MD;  Location: [facility];  Service: Orthopedics;  Laterality: Right;  ORIF Right Ankle Lateral Malleolus     Current Outpatient Prescriptions  Medication Sig Dispense Refill  . ALPRAZolam (XANAX) 0.5 MG tablet Take 0.5 mg by mouth as needed for anxiety.    Marland Kitchen dexlansoprazole (DEXILANT) 60 MG capsule Take 60 mg by mouth every morning.     . docusate sodium (COLACE) 100 MG capsule Take 1 capsule (100 mg total) by mouth 2 (two) times daily. 20 capsule 0  . Fe Fum-FePoly-FA-Vit C-Vit B3 (INTEGRA F PO) Take 1 tablet by mouth every morning.     Marland Kitchen ibuprofen (ADVIL) 200 MG tablet Take 3 tablets (600 mg total) by mouth every 6 (six) hours as needed. Take every 6 hrs for first 48 hours then as needed for pain 30 tablet 0  . oxyCODONE (OXY IR/ROXICODONE) 5 MG immediate release tablet Take 1 tablet (5 mg total) by mouth  every 4 (four) hours as needed for severe pain. 30 tablet 0  . Probiotic Product (ALIGN PO) Take 1 tablet by mouth daily as needed. For digestion    . promethazine (PHENERGAN) 12.5 MG tablet Take 1 tablet (12.5 mg total) by mouth every 6 (six) hours as needed for nausea. 30 tablet 0  . propranolol ER (INDERAL LA) 80 MG 24 hr capsule Take 80 mg by mouth every morning.    . triamterene-hydrochlorothiazide (MAXZIDE-25) 37.5-25 MG per tablet Take 0.5 tablets by mouth every morning.      No current facility-administered medications for this visit.    Allergies:   Doxycycline    Social History:  The patient  reports that she has never smoked. She has never used smokeless tobacco. She reports that she drinks alcohol. She reports that she does not use illicit drugs.   Family History:  The patient's family history is not on file.    ROS:   Please see the history of present illness.   ROS    PHYSICAL EXAM: VS:  There were no vitals taken for this visit.    Wt Readings from Last 3 Encounters:  01/28/13 188 lb (85.276 kg)  07/27/12 183 lb (83.008 kg)  08/05/11 186 lb 4.6 oz (84.5 kg)     GEN: Well nourished, well developed, in no acute distress HEENT: normal Neck: no JVD, no carotid bruits, no masses Cardiac:  Normal S1/S2, RRR; no murmur ,  no rubs or gallops, no edema *** Respiratory:  clear to auscultation bilaterally, no wheezing, rhonchi or rales. GI: soft, nontender, nondistended, + BS MS: no deformity or atrophy Skin: warm and dry  Neuro:  CNs II-XII intact, Strength and sensation are intact Psych: Normal affect   EKG:  EKG is ordered today.  It demonstrates:   ***   Recent Labs: 11/26/2013: BUN 13; Creatinine 0.68; Hemoglobin 12.3; Platelets 313; Potassium 3.8; Sodium 139    Lipid Panel No results found for: CHOL, TRIG, HDL, CHOLHDL, VLDL, LDLCALC, LDLDIRECT    ASSESSMENT AND PLAN:  SVT (supraventricular tachycardia)  Essential hypertension    Current medicines  are reviewed at length with the patient today.  Concerns regarding medicines are as outlined above.  The following changes have been made:    ***   Labs/ tests ordered today include: *** No orders of the defined types were placed in this encounter.    Disposition:   FU with ***   Signed, Richardson Dopp, PA-C, MHS 07/06/2014 8:55 PM    [facility] [address], [place], [place]  [address] Phone: [phone]; Fax: [phone]

## 2014-07-07 ENCOUNTER — Encounter: Payer: Self-pay | Admitting: Physician Assistant

## 2014-07-08 NOTE — Progress Notes (Signed)
This encounter was created in error - please disregard.

## 2015-01-02 ENCOUNTER — Other Ambulatory Visit (HOSPITAL_COMMUNITY): Payer: Self-pay | Admitting: Orthopaedic Surgery

## 2015-01-02 DIAGNOSIS — M25561 Pain in right knee: Secondary | ICD-10-CM

## 2015-03-04 ENCOUNTER — Ambulatory Visit (HOSPITAL_COMMUNITY)
Admission: RE | Admit: 2015-03-04 | Discharge: 2015-03-04 | Disposition: A | Discharge status: Home / Self Care | Payer: 59 | Source: Ambulatory Visit | Attending: Orthopaedic Surgery | Admitting: Orthopaedic Surgery

## 2015-03-04 DIAGNOSIS — M25461 Effusion, right knee: Secondary | ICD-10-CM | POA: Insufficient documentation

## 2015-03-04 DIAGNOSIS — M179 Osteoarthritis of knee, unspecified: Secondary | ICD-10-CM | POA: Insufficient documentation

## 2015-03-04 DIAGNOSIS — M25561 Pain in right knee: Secondary | ICD-10-CM | POA: Diagnosis present

## 2015-03-04 DIAGNOSIS — M7121 Synovial cyst of popliteal space [Baker], right knee: Secondary | ICD-10-CM | POA: Insufficient documentation

## 2015-03-19 MED FILL — PROPRANOLOL ER 80 MG CAP: 80 | 90 days supply | Qty: 90 | Fill #1

## 2015-03-23 DIAGNOSIS — M25561 Pain in right knee: Secondary | ICD-10-CM | POA: Diagnosis not present

## 2015-04-13 MED FILL — DEXILANT DR 60 MG CAPSULE: 60 | 90 days supply | Qty: 90 | Fill #1

## 2015-04-17 DIAGNOSIS — D239 Other benign neoplasm of skin, unspecified: Secondary | ICD-10-CM | POA: Diagnosis not present

## 2015-04-17 DIAGNOSIS — Z13220 Encounter for screening for lipoid disorders: Secondary | ICD-10-CM | POA: Diagnosis not present

## 2015-04-17 DIAGNOSIS — I10 Essential (primary) hypertension: Secondary | ICD-10-CM | POA: Diagnosis not present

## 2015-04-17 DIAGNOSIS — R739 Hyperglycemia, unspecified: Secondary | ICD-10-CM | POA: Diagnosis not present

## 2015-04-17 DIAGNOSIS — M1711 Unilateral primary osteoarthritis, right knee: Secondary | ICD-10-CM | POA: Diagnosis not present

## 2015-04-30 ENCOUNTER — Ambulatory Visit: Payer: 59 | Admitting: Sports Medicine

## 2015-05-20 ENCOUNTER — Encounter: Payer: Self-pay | Admitting: Sports Medicine

## 2015-05-20 ENCOUNTER — Ambulatory Visit (INDEPENDENT_AMBULATORY_CARE_PROVIDER_SITE_OTHER): Payer: 59 | Admitting: Sports Medicine

## 2015-05-20 VITALS — BP 125/76 | Ht 67.0 in | Wt 189.0 lb

## 2015-05-20 DIAGNOSIS — M1711 Unilateral primary osteoarthritis, right knee: Secondary | ICD-10-CM | POA: Diagnosis not present

## 2015-05-20 NOTE — Patient Instructions (Signed)
Knee arthritis  Studies show that biking was the most efficient knee exercise to protect osteoarthritis Biking 3 to 4 times per week is excellent Avoid too much resistance Watch standing positions if you do spin  Walking is OK but only every other day  Cushion is good for knees so add a soft insole with arch support  Try the study first and you may or may not want to do and continue compression

## 2015-05-20 NOTE — Progress Notes (Signed)
Patient ID: Kimberly Boyd, female   DOB: 31-Jul-1965, 50 y.o.   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 ROM rt is -2 to 130 on left is 0 to 145 Ligaments with solid consistent endpoints including ACL, PCL, LCL, MCL. Negative Mcmurray's and provocative meniscal tests. Non painful patellar compression. Patellar and quadriceps tendons unremarkable. Hamstring and quadriceps strength is normal. Very good hip abduction strength  Gait shows varus thrust on RT This increases her resting genu valgus shift  MRI Reviewed with patient Several areas of articular cartilage loss at least grade 3 Lateral meniscus is extruded with meniscal cyst complex Effusion Baker's cyst

## 2015-05-20 NOTE — Assessment & Plan Note (Signed)
I think she is an excellent candidate to try conservative care Pain and stability are reasonably controlles Swelling is major issue  HEP advised  Trial in knee OA study  Recheck post 3 mos

## 2015-06-01 ENCOUNTER — Other Ambulatory Visit: Payer: Self-pay

## 2015-06-01 DIAGNOSIS — Z1231 Encounter for screening mammogram for malignant neoplasm of breast: Secondary | ICD-10-CM

## 2015-06-05 DIAGNOSIS — M25561 Pain in right knee: Secondary | ICD-10-CM | POA: Diagnosis not present

## 2015-06-05 MED FILL — MELOXICAM 15 MG TABLET: 15 | 30 days supply | Qty: 30 | Fill #0

## 2015-06-10 DIAGNOSIS — H524 Presbyopia: Secondary | ICD-10-CM | POA: Diagnosis not present

## 2015-06-15 ENCOUNTER — Ambulatory Visit
Admission: RE | Admit: 2015-06-15 | Discharge: 2015-06-15 | Disposition: A | Discharge status: Home / Self Care | Payer: 59 | Source: Ambulatory Visit

## 2015-06-15 DIAGNOSIS — Z1231 Encounter for screening mammogram for malignant neoplasm of breast: Secondary | ICD-10-CM | POA: Diagnosis not present

## 2015-06-23 MED FILL — TRIAMTERENE-HCTZ 37.5-25 MG: 37.5-25 | 90 days supply | Qty: 45 | Fill #0

## 2015-06-23 MED FILL — PROPRANOLOL ER 80 MG CAP: 80 | 90 days supply | Qty: 90 | Fill #0

## 2015-07-06 MED FILL — DEXILANT DR 60 MG CAPSULE: 60 | 90 days supply | Qty: 90 | Fill #0

## 2015-09-28 MED FILL — MELOXICAM 15 MG TABLET: 15 | 30 days supply | Qty: 30 | Fill #0

## 2015-10-01 MED FILL — PROPRANOLOL ER 80 MG CAP: 80 | 90 days supply | Qty: 90 | Fill #1

## 2015-10-05 MED FILL — TRIAMTERENE-HCTZ 37.5-25 MG: 37.5-25 | 90 days supply | Qty: 45 | Fill #1

## 2015-10-21 MED FILL — DEXILANT DR 60 MG CAPSULE: 60 | 90 days supply | Qty: 90 | Fill #0

## 2015-10-29 IMAGING — XA IR EMBO TUMOR ORGAN ISCHEMIA INFARCT INC GUIDE ROADMAPPING
12 of 22 series · 12 of 24 positions shown · IV contrast (IODINE)
Comparison: Contrast-enhanced pelvic MRI - 08/07/2013

INDICATION: Symptomatic uterine fibroids.

EXAM:
1. ULTRASOUND GUIDANCE FOR ARTERIAL ACCESS.
2. BILATERAL INTERNAL ILIAC ARTERIOGRAM.
3. SELECTIVE RIGHT UTERINE ARTERY ARTERIOGRAM AND PERCUTANEOUS
PARTICLE EMBOLIZATION.
4. 3D VASCULAR RECONSTRUCTION

[Series 3: care body 4 · 1 of 19 frames shown (1 of 11)]
[frame 3/19]
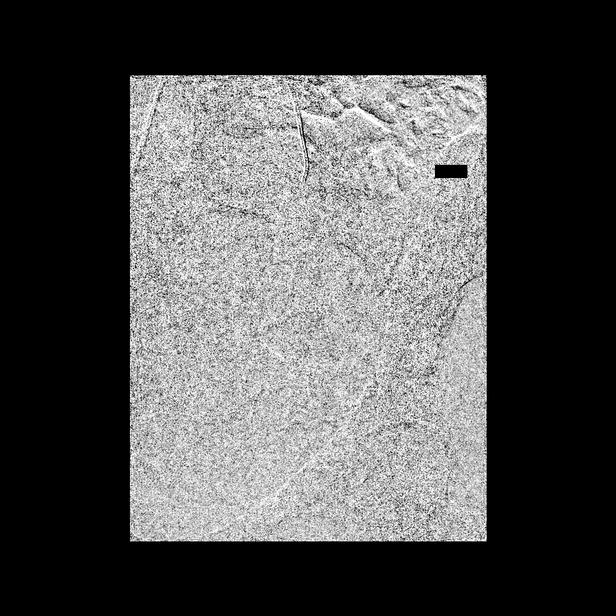

[Series 4: care body 4 · 1 of 18 frames shown (2 of 11)]
[frame 16/18]
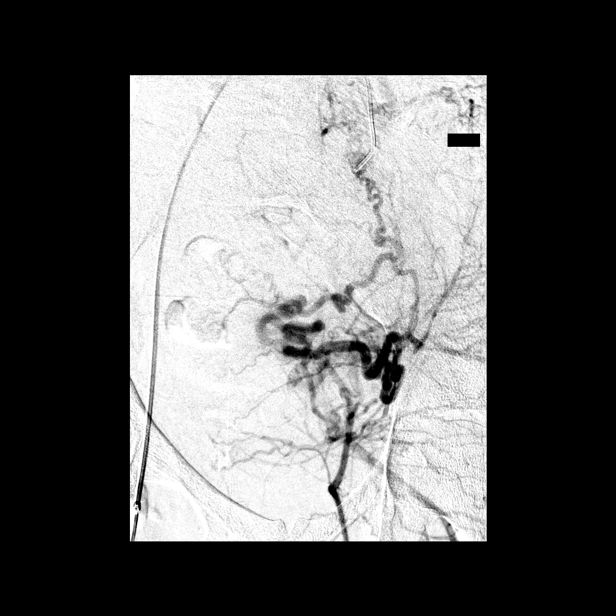

[Series 6: care body 4 · 1 of 19 frames shown (3 of 11)]
[frame 17/19]
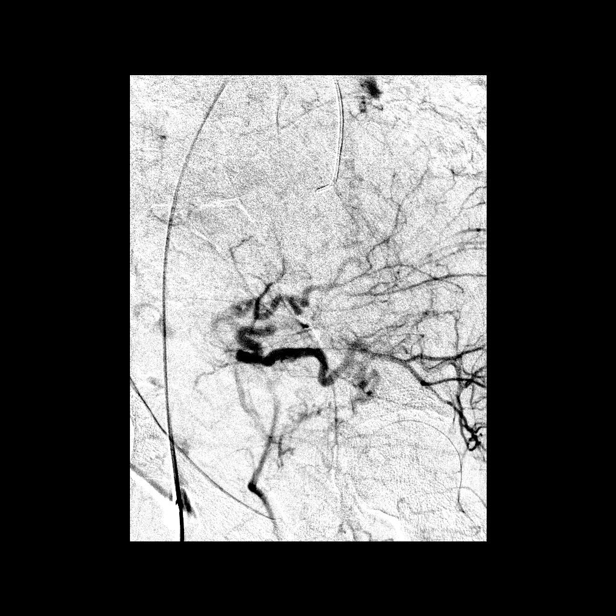

[Series 8: care body 4 · 1 of 17 frames shown (4 of 11)]
[frame 12/17]
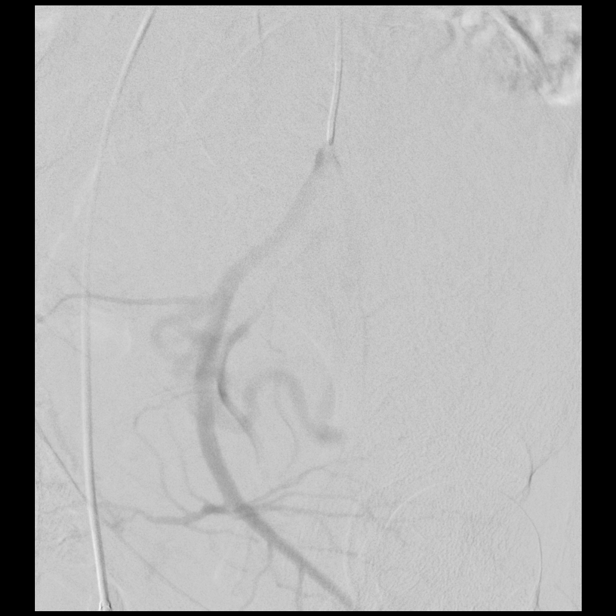

[Series 10: care body 4 · 1 of 16 frames shown (5 of 11)]
[frame 14/16]
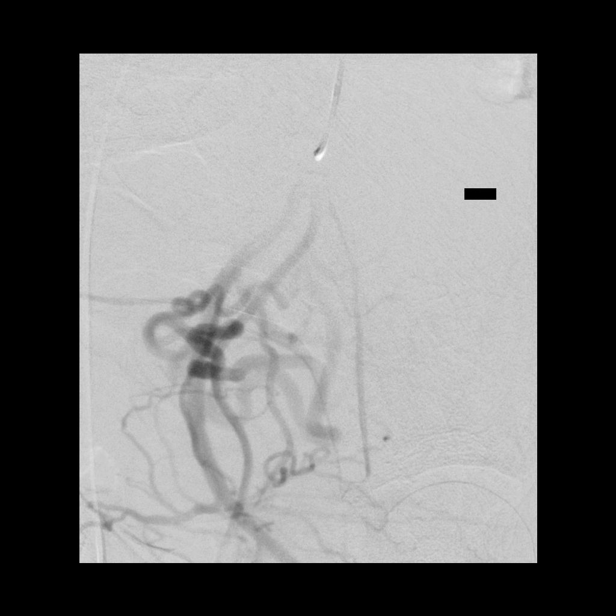

[Series 12: care body 4 · 1 of 18 frames shown (6 of 11)]
[frame 16/18]
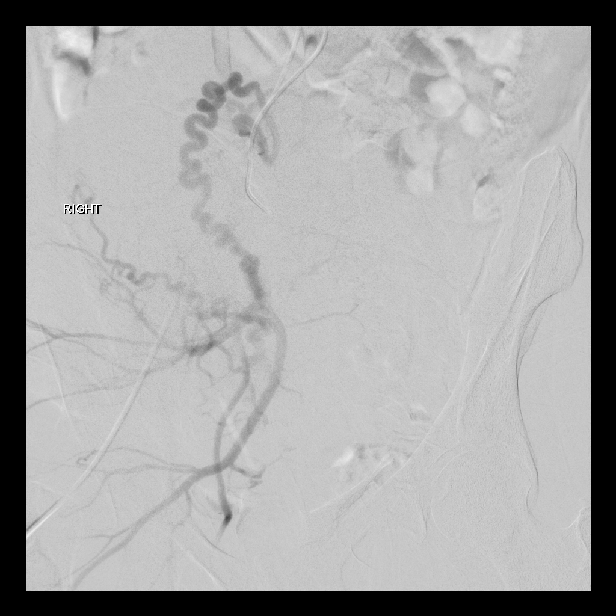

[Series 15: care body 4 · 1 of 34 frames shown (7 of 11)]
[frame 18/34]
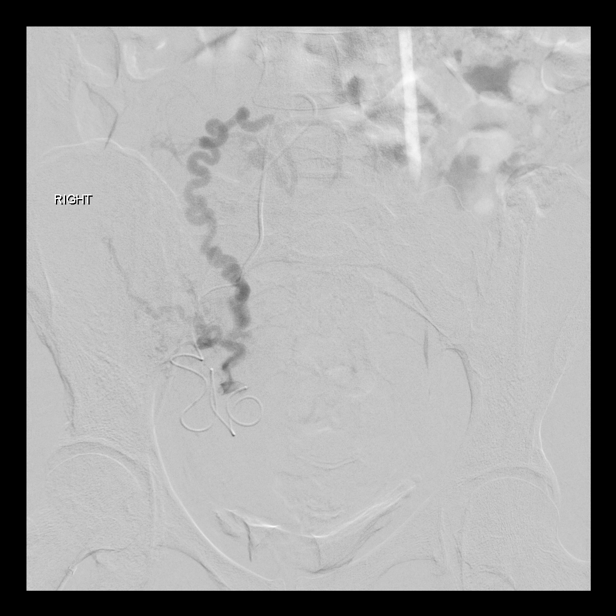

[Series 17: care body 4 · 1 of 28 frames shown (8 of 11)]
[frame 24/28]
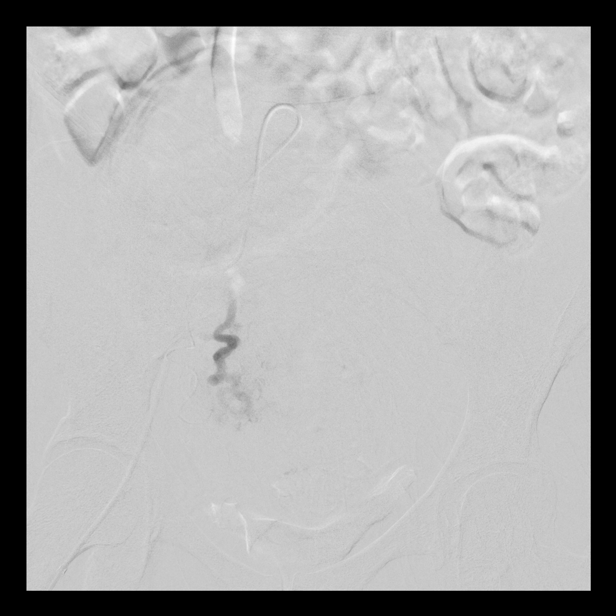

[Series 19: care body 4 · 1 of 17 frames shown (9 of 11)]
[frame 15/17]
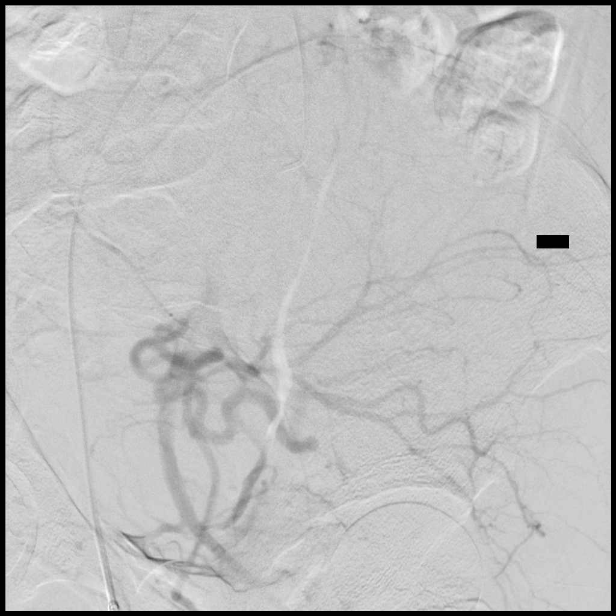

[Series 21: care body 4 · 1 of 18 frames shown (10 of 11)]
[frame 16/18]
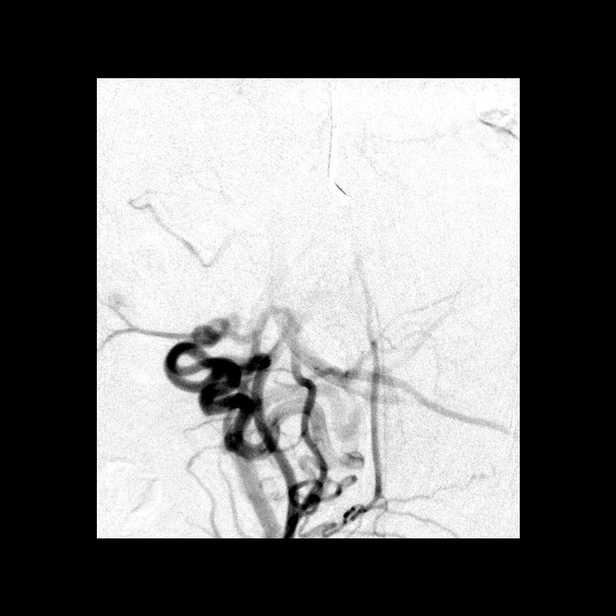

[Series 23: care body 4 · 1 of 17 frames shown (11 of 11)]
[frame 15/17]
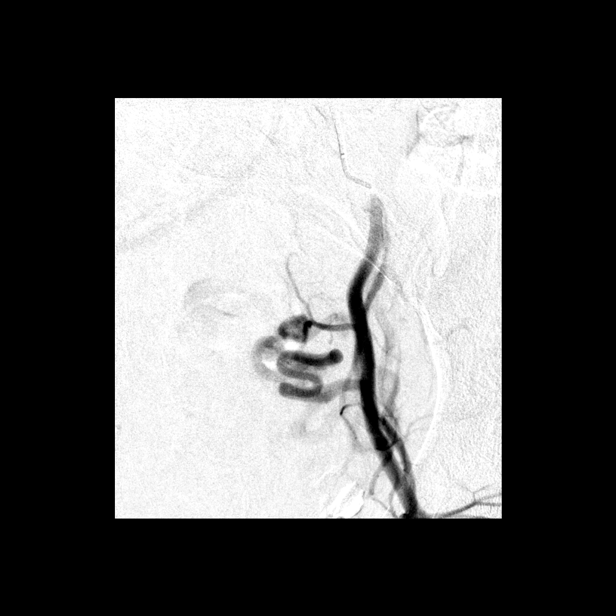

[Series 300: ir embo tumor organ ischemia infarct inc · 1 of 1 slices shown]
[im 1/1]
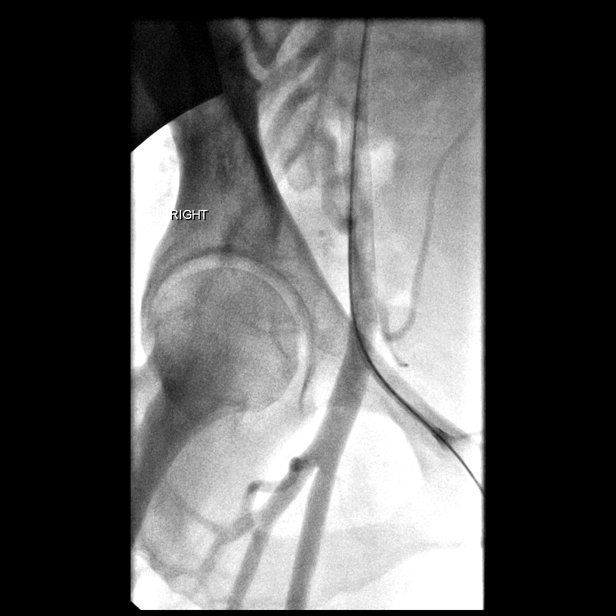

[12 of 24 positions shown; findings below may reference images not displayed]

MEDICATIONS:
Fentanyl 300 mcg IV; Versed 9 mg IV; Toradol 30 mg IV; Dilaudid
mg IV; Zofran 4 mg IV; Ancef 2 gm IV; The antibiotic was
administered within one hour of the procedure

ANESTHESIA/SEDATION:
Sedation time

2 hours, 45 minutes

CONTRAST:  170 cc Omnipaque 350

FLUOROSCOPY TIME:  58 minutes.  30 seconds.

COMPLICATIONS:
None immediate



The right femoral head was marked fluoroscopically. Under sterile
conditions and local anesthesia, the right common femoral artery
access was performed with a micropuncture needle. Under direct
ultrasound guidance, the right common femoral was accessed with a
micropuncture kit. An ultrasound image was saved for documentation
purposes. This allowed for placement of a 5-French vascular sheath.
A limited arteriogram was performed through the side arm of the
sheath confirming appropriate access within the right common femoral
artery.

Over a Bentson wire, a C2 catheter was utilized to select the
contralateral left internal iliac artery. The 5-Magui Kopf
uterine catheter was utilized to select the contralateral left
internal iliac artery. Selective left internal iliac angiogram was
performed. The tortuous left uterine artery was identified. Despite
prolonged efforts, the left uterine artery could not be selectively
catheterize secondary to a 80 mild irregular narrowing of the
vessels origin. Despite prolonged efforts, the vessel could not be
selectively cannulated and as such, the C2 catheter was removed from
the left internal iliac artery and utilized to select the
ipsilateral right internal iliac artery.

Right internal iliac arteriogram was performed demonstrating the
tortuous origin of the right uterine artery. With the use of a
synchro micro wire, a Progreat micro catheter was advanced into the
distal aspect of the right uterine artery. Selective right uterine
arteriogram was performed. For embolization, 3 vials of 500 - 700
micron and 4 vials of 700 - 900 micron Embospheres were injected
into the right uterine artery. Post embolization angiogram confirms
complete stasis of the left uterine vascular territory.

Again, the C2 catheter was utilized to select the contralateral left
internal iliac artery. A 3D rotational left internal iliac angiogram
was performed to help better define the origin of the left uterine
artery, however again despite prolonged efforts, the left uterine
artery could not be selectively cannulated. At this point, the
procedure was terminated.

All wires, catheters and sheaths were removed from the patient.
Hemostasis was achieved at the right groin access site with manual
compression. The patient tolerated the procedure well without
immediate post procedural complication.
FINDINGS: Left internal iliac arteriogram demonstrates a tortuous origin of
the left internal iliac artery. There is a mild slightly irregular
narrowing involving the origin of the left uterine artery (best seen
upon RA0 (-) 35 degree projection, as confirmed with 3D
reconstruction angiographic images). The micro wire could only be
advanced into the very proximal aspect of the left uterine artery
however despite prolonged efforts, could not be advanced beyond this
location. As such, selective left uterine artery particle
embolization was not performed.

Selective right uterine arteriogram demonstrates dominant supply to
the large fibroid within the right-side of the uterine fundus.
Technically successful particle embolization of the right uterine
artery. Post embolization angiogram confirms stasis of the right
uterine vascular territory.
IMPRESSION: 1. Successful right uterine artery embolization (U F E).
2. Unsuccessful attempted left uterine artery embolization.

PLAN:
The patient will be seen in the [REDACTED] in 1
month time for follow-up evaluation. I hope that the sub selective
embolization of the dominant right uterine artery will result in a
clinical success. If however the patient's symptoms persist, I would
have a low threshold to repeat a contrast-enhanced pelvic MRI.

## 2015-11-09 MED FILL — INTEGRA CAPSULE: 62.5-62.5-4 | 90 days supply | Qty: 90 | Fill #0

## 2015-11-19 DIAGNOSIS — I10 Essential (primary) hypertension: Secondary | ICD-10-CM | POA: Diagnosis not present

## 2015-11-19 DIAGNOSIS — F5089 Other specified eating disorder: Secondary | ICD-10-CM | POA: Diagnosis not present

## 2015-11-19 DIAGNOSIS — R739 Hyperglycemia, unspecified: Secondary | ICD-10-CM | POA: Diagnosis not present

## 2015-11-19 DIAGNOSIS — D500 Iron deficiency anemia secondary to blood loss (chronic): Secondary | ICD-10-CM | POA: Diagnosis not present

## 2015-11-19 DIAGNOSIS — M1711 Unilateral primary osteoarthritis, right knee: Secondary | ICD-10-CM | POA: Diagnosis not present

## 2015-11-19 DIAGNOSIS — D259 Leiomyoma of uterus, unspecified: Secondary | ICD-10-CM | POA: Diagnosis not present

## 2015-12-28 MED FILL — GENTAK 3 MG/ML EYE DROPS: 0.3 | 8 days supply | Qty: 5 | Fill #0

## 2016-01-14 MED FILL — TRIAMTERENE-HCTZ 37.5-25 MG: 37.5-25 | 90 days supply | Qty: 45 | Fill #0

## 2016-01-14 MED FILL — MELOXICAM 15 MG TABLET: 15 | 30 days supply | Qty: 30 | Fill #1

## 2016-01-14 MED FILL — PROPRANOLOL ER 80 MG CAP: 80 | 90 days supply | Qty: 90 | Fill #0

## 2016-01-27 MED FILL — DEXILANT DR 60 MG CAPSULE: 60 | 90 days supply | Qty: 90 | Fill #0

## 2016-02-02 DIAGNOSIS — D509 Iron deficiency anemia, unspecified: Secondary | ICD-10-CM | POA: Diagnosis not present

## 2016-02-02 DIAGNOSIS — R635 Abnormal weight gain: Secondary | ICD-10-CM | POA: Diagnosis not present

## 2016-02-02 DIAGNOSIS — K219 Gastro-esophageal reflux disease without esophagitis: Secondary | ICD-10-CM | POA: Diagnosis not present

## 2016-02-02 DIAGNOSIS — Z1211 Encounter for screening for malignant neoplasm of colon: Secondary | ICD-10-CM | POA: Diagnosis not present

## 2016-02-29 MED FILL — GAVILYTE-G SOLUTION: 236 | 1 days supply | Qty: 4000 | Fill #0

## 2016-03-02 DIAGNOSIS — D124 Benign neoplasm of descending colon: Secondary | ICD-10-CM | POA: Diagnosis not present

## 2016-03-02 DIAGNOSIS — K635 Polyp of colon: Secondary | ICD-10-CM | POA: Diagnosis not present

## 2016-03-02 DIAGNOSIS — D125 Benign neoplasm of sigmoid colon: Secondary | ICD-10-CM | POA: Diagnosis not present

## 2016-03-02 DIAGNOSIS — Z1211 Encounter for screening for malignant neoplasm of colon: Secondary | ICD-10-CM | POA: Diagnosis not present

## 2016-03-16 ENCOUNTER — Encounter: Payer: Self-pay | Admitting: Interventional Radiology

## 2016-03-16 DIAGNOSIS — Z1151 Encounter for screening for human papillomavirus (HPV): Secondary | ICD-10-CM | POA: Diagnosis not present

## 2016-03-16 DIAGNOSIS — Z6831 Body mass index (BMI) 31.0-31.9, adult: Secondary | ICD-10-CM | POA: Diagnosis not present

## 2016-03-16 DIAGNOSIS — Z113 Encounter for screening for infections with a predominantly sexual mode of transmission: Secondary | ICD-10-CM | POA: Diagnosis not present

## 2016-03-16 DIAGNOSIS — Z01419 Encounter for gynecological examination (general) (routine) without abnormal findings: Secondary | ICD-10-CM | POA: Diagnosis not present

## 2016-03-18 MED FILL — MELOXICAM 15 MG TABLET: 15 | 30 days supply | Qty: 30 | Fill #1

## 2016-03-21 MED FILL — INTEGRA CAPSULE: 62.5-62.5-4 | 90 days supply | Qty: 270 | Fill #0

## 2016-04-06 DIAGNOSIS — M25561 Pain in right knee: Secondary | ICD-10-CM | POA: Diagnosis not present

## 2016-04-22 MED FILL — PROPRANOLOL ER 80 MG CAP: 80 | 90 days supply | Qty: 90 | Fill #1

## 2016-04-29 DIAGNOSIS — N390 Urinary tract infection, site not specified: Secondary | ICD-10-CM | POA: Diagnosis not present

## 2016-04-29 DIAGNOSIS — R300 Dysuria: Secondary | ICD-10-CM | POA: Diagnosis not present

## 2016-04-29 MED FILL — SULFAMETHOXAZOLE/TMP DS TAB: 800-160 | 3 days supply | Qty: 6 | Fill #0

## 2016-05-05 MED FILL — TRIAMTERENE-HCTZ 37.5-25 MG: 37.5-25 | 90 days supply | Qty: 45 | Fill #1

## 2016-06-10 MED FILL — DEXILANT DR 60 MG CAPSULE: 60 | 90 days supply | Qty: 90 | Fill #1

## 2016-06-25 DIAGNOSIS — H524 Presbyopia: Secondary | ICD-10-CM | POA: Diagnosis not present

## 2016-06-28 DIAGNOSIS — Z13220 Encounter for screening for lipoid disorders: Secondary | ICD-10-CM | POA: Diagnosis not present

## 2016-06-28 DIAGNOSIS — D239 Other benign neoplasm of skin, unspecified: Secondary | ICD-10-CM | POA: Diagnosis not present

## 2016-06-28 DIAGNOSIS — I10 Essential (primary) hypertension: Secondary | ICD-10-CM | POA: Diagnosis not present

## 2016-06-28 DIAGNOSIS — Z0000 Encounter for general adult medical examination without abnormal findings: Secondary | ICD-10-CM | POA: Diagnosis not present

## 2016-06-28 DIAGNOSIS — M1711 Unilateral primary osteoarthritis, right knee: Secondary | ICD-10-CM | POA: Diagnosis not present

## 2016-06-28 DIAGNOSIS — D500 Iron deficiency anemia secondary to blood loss (chronic): Secondary | ICD-10-CM | POA: Diagnosis not present

## 2016-06-28 DIAGNOSIS — R739 Hyperglycemia, unspecified: Secondary | ICD-10-CM | POA: Diagnosis not present

## 2016-06-28 MED FILL — MELOXICAM 15 MG TABLET: 15 | 90 days supply | Qty: 90 | Fill #0

## 2016-08-10 MED FILL — PROPRANOLOL ER 80 MG CAP: 80 | 90 days supply | Qty: 90 | Fill #0

## 2016-08-22 MED FILL — TRIAMTERENE-HCTZ 37.5-25 MG: 37.5-25 | 90 days supply | Qty: 45 | Fill #0

## 2016-09-05 MED FILL — DEXILANT DR 60 MG CAPSULE: 60 | 30 days supply | Qty: 30 | Fill #0

## 2016-10-11 DIAGNOSIS — I83899 Varicose veins of unspecified lower extremities with other complications: Secondary | ICD-10-CM | POA: Diagnosis not present

## 2016-10-11 DIAGNOSIS — E559 Vitamin D deficiency, unspecified: Secondary | ICD-10-CM | POA: Diagnosis not present

## 2016-10-11 DIAGNOSIS — D500 Iron deficiency anemia secondary to blood loss (chronic): Secondary | ICD-10-CM | POA: Diagnosis not present

## 2016-10-11 DIAGNOSIS — M791 Myalgia: Secondary | ICD-10-CM | POA: Diagnosis not present

## 2016-10-11 DIAGNOSIS — R700 Elevated erythrocyte sedimentation rate: Secondary | ICD-10-CM | POA: Diagnosis not present

## 2016-10-12 MED FILL — VIT D2 1.25 MG (50,000 UNIT: 1.25 MG | 84 days supply | Qty: 12 | Fill #0

## 2016-10-18 MED FILL — DEXILANT DR 60 MG CAPSULE: 60 | 30 days supply | Qty: 30 | Fill #1

## 2016-11-22 MED FILL — PROPRANOLOL ER 80 MG CAP: 80 | 90 days supply | Qty: 90 | Fill #1

## 2016-11-30 MED FILL — TRIAMTERENE-HCTZ 37.5-25 MG: 37.5-25 | 90 days supply | Qty: 45 | Fill #1

## 2016-11-30 MED FILL — DEXILANT DR 60 MG CAPSULE: 60 | 30 days supply | Qty: 30 | Fill #2

## 2016-12-20 DIAGNOSIS — D500 Iron deficiency anemia secondary to blood loss (chronic): Secondary | ICD-10-CM | POA: Diagnosis not present

## 2016-12-20 DIAGNOSIS — M791 Myalgia, unspecified site: Secondary | ICD-10-CM | POA: Diagnosis not present

## 2016-12-20 DIAGNOSIS — I83899 Varicose veins of unspecified lower extremities with other complications: Secondary | ICD-10-CM | POA: Diagnosis not present

## 2016-12-20 DIAGNOSIS — R700 Elevated erythrocyte sedimentation rate: Secondary | ICD-10-CM | POA: Diagnosis not present

## 2016-12-20 DIAGNOSIS — M419 Scoliosis, unspecified: Secondary | ICD-10-CM | POA: Diagnosis not present

## 2016-12-20 DIAGNOSIS — E559 Vitamin D deficiency, unspecified: Secondary | ICD-10-CM | POA: Diagnosis not present

## 2016-12-20 DIAGNOSIS — M79672 Pain in left foot: Secondary | ICD-10-CM | POA: Diagnosis not present

## 2016-12-20 DIAGNOSIS — M79604 Pain in right leg: Secondary | ICD-10-CM | POA: Diagnosis not present

## 2016-12-20 DIAGNOSIS — M79605 Pain in left leg: Secondary | ICD-10-CM | POA: Diagnosis not present

## 2016-12-21 ENCOUNTER — Other Ambulatory Visit: Payer: Self-pay | Admitting: Obstetrics and Gynecology

## 2016-12-21 DIAGNOSIS — Z1231 Encounter for screening mammogram for malignant neoplasm of breast: Secondary | ICD-10-CM

## 2017-01-02 ENCOUNTER — Ambulatory Visit
Admission: RE | Admit: 2017-01-02 | Discharge: 2017-01-02 | Disposition: A | Discharge status: Home / Self Care | Payer: 59 | Source: Ambulatory Visit | Attending: Obstetrics and Gynecology | Admitting: Obstetrics and Gynecology

## 2017-01-02 DIAGNOSIS — Z1231 Encounter for screening mammogram for malignant neoplasm of breast: Secondary | ICD-10-CM | POA: Diagnosis not present

## 2017-01-02 MED FILL — DEXILANT DR 60 MG CAPSULE: 60 | 30 days supply | Qty: 30 | Fill #3

## 2017-01-05 DIAGNOSIS — M9902 Segmental and somatic dysfunction of thoracic region: Secondary | ICD-10-CM | POA: Diagnosis not present

## 2017-01-05 DIAGNOSIS — M9904 Segmental and somatic dysfunction of sacral region: Secondary | ICD-10-CM | POA: Diagnosis not present

## 2017-01-05 DIAGNOSIS — M545 Low back pain: Secondary | ICD-10-CM | POA: Diagnosis not present

## 2017-01-05 DIAGNOSIS — M62830 Muscle spasm of back: Secondary | ICD-10-CM | POA: Diagnosis not present

## 2017-01-30 DIAGNOSIS — M9904 Segmental and somatic dysfunction of sacral region: Secondary | ICD-10-CM | POA: Diagnosis not present

## 2017-01-30 DIAGNOSIS — M545 Low back pain: Secondary | ICD-10-CM | POA: Diagnosis not present

## 2017-01-30 DIAGNOSIS — M62830 Muscle spasm of back: Secondary | ICD-10-CM | POA: Diagnosis not present

## 2017-01-30 DIAGNOSIS — M9902 Segmental and somatic dysfunction of thoracic region: Secondary | ICD-10-CM | POA: Diagnosis not present

## 2017-01-31 MED FILL — DEXILANT DR 60 MG CAPSULE: 60 | 30 days supply | Qty: 30 | Fill #4

## 2017-02-08 DIAGNOSIS — H9313 Tinnitus, bilateral: Secondary | ICD-10-CM | POA: Diagnosis not present

## 2017-02-08 DIAGNOSIS — H9042 Sensorineural hearing loss, unilateral, left ear, with unrestricted hearing on the contralateral side: Secondary | ICD-10-CM | POA: Diagnosis not present

## 2017-02-08 DIAGNOSIS — H903 Sensorineural hearing loss, bilateral: Secondary | ICD-10-CM | POA: Diagnosis not present

## 2017-02-08 DIAGNOSIS — H9312 Tinnitus, left ear: Secondary | ICD-10-CM | POA: Diagnosis not present

## 2017-02-14 ENCOUNTER — Encounter (HOSPITAL_BASED_OUTPATIENT_CLINIC_OR_DEPARTMENT_OTHER): Payer: Self-pay | Admitting: Emergency Medicine

## 2017-02-14 ENCOUNTER — Emergency Department (HOSPITAL_BASED_OUTPATIENT_CLINIC_OR_DEPARTMENT_OTHER): Payer: 59

## 2017-02-14 ENCOUNTER — Emergency Department (HOSPITAL_BASED_OUTPATIENT_CLINIC_OR_DEPARTMENT_OTHER)
Admission: EM | Admit: 2017-02-14 | Discharge: 2017-02-14 | Disposition: A | Discharge status: Home / Self Care | Payer: 59 | Attending: Emergency Medicine | Admitting: Emergency Medicine

## 2017-02-14 ENCOUNTER — Other Ambulatory Visit: Payer: Self-pay

## 2017-02-14 DIAGNOSIS — M79605 Pain in left leg: Secondary | ICD-10-CM | POA: Diagnosis not present

## 2017-02-14 DIAGNOSIS — M7989 Other specified soft tissue disorders: Secondary | ICD-10-CM | POA: Diagnosis not present

## 2017-02-14 DIAGNOSIS — Z79899 Other long term (current) drug therapy: Secondary | ICD-10-CM | POA: Insufficient documentation

## 2017-02-14 DIAGNOSIS — M79662 Pain in left lower leg: Secondary | ICD-10-CM | POA: Diagnosis not present

## 2017-02-14 DIAGNOSIS — R2242 Localized swelling, mass and lump, left lower limb: Secondary | ICD-10-CM | POA: Diagnosis not present

## 2017-02-14 DIAGNOSIS — I10 Essential (primary) hypertension: Secondary | ICD-10-CM | POA: Insufficient documentation

## 2017-02-14 NOTE — ED Triage Notes (Signed)
Patient states that she started to have pain to her calf in "one spot" starting yesterday  - she reports that she has had pain to her foot with swelling x a couple of weeks. The patient denies any SOB or chest pain

## 2017-02-15 NOTE — ED Provider Notes (Signed)
[facility] EMERGENCY DEPARTMENT Provider Note   CSN: [id number] Arrival date & time: 02/14/17  2045     History   Chief Complaint Chief Complaint  Patient presents with  . Leg Pain    HPI MARYLAN GLORE is a 51 y.o. female who presents with left calf pain and left foot pain. Pain started several weeks ago. She has one certain area over the calf that is tender with deep palpation and the top of her foot is painful. Nothing makes it better. It's worse with walking. She has associated mild dorsal foot swelling. No CP/SOB. She is worried about a blood clot because she sits for long periods of time and has gone on several long trips recently. Of note she has been dealing with bilateral diffuse leg aching for several months. She has been worked up for this and was noted to have a  mildly elevated ESR and was also diagnosed with sacroilitis in the past. She states that she feels like this pain is unrelated and feels different. She has f/u with rhuematology.   HPI  Past Medical History:  Diagnosis Date  . Back pain years.    chronic low back pain   . Complication of anesthesia    tachycardia, difficulty waking up  . GERD (gastroesophageal reflux disease)   . Heart murmur    questionable  . Hypertension   . Palpitations   . Palpitations 2011   takes  propanolol     Patient Active Problem List   Diagnosis Date Noted  . Osteoarthritis of right knee 05/20/2015  . Fibroid, uterine   . Fever 09/30/2010  . Supraventricular tachycardia-probable concealed accessory pathway 08/31/2010  . Stress 08/31/2010  . ALLERGIC RHINITIS 11/20/2008  . GERD 11/20/2008  . PALPITATIONS 11/20/2008  . CHEST PAIN 11/20/2008    Past Surgical History:  Procedure Laterality Date  . cervical dysplasia laser    . dermoid cyst removal    . IR GENERIC HISTORICAL  06/04/2014   IR RADIOLOGIST EVAL & MGMT 06/04/2014 Sandi Mariscal, MD [facility] INTERV RAD  . LAPAROSCOPIC CHOLECYSTECTOMY    . LIPOMA  RESECTION     and redone  . ORIF ANKLE FRACTURE  08/09/2011   Procedure: OPEN REDUCTION INTERNAL FIXATION (ORIF) ANKLE FRACTURE;  Surgeon: Wylene Simmer, MD;  Location: [place] [facility];  Service: Orthopedics;  Laterality: Right;  ORIF Right Ankle Lateral Malleolus  . scar revision for the lipoma      OB History    No data available       Home Medications    Prior to Admission medications   Medication Sig Start Date End Date Taking? Authorizing Provider  ALPRAZolam Duanne Moron) 0.5 MG tablet Take 0.5 mg by mouth as needed for anxiety.    [provider]  dexlansoprazole (DEXILANT) 60 MG capsule Take 60 mg by mouth every morning.     [provider]  docusate sodium (COLACE) 100 MG capsule Take 1 capsule (100 mg total) by mouth 2 (two) times daily. 11/27/13   Tsosie Billing D, PA-C  Fe Fum-FePoly-FA-Vit C-Vit B3 (INTEGRA F PO) Take 1 tablet by mouth every morning.     [provider]  ibuprofen (ADVIL) 200 MG tablet Take 3 tablets (600 mg total) by mouth every 6 (six) hours as needed. Take every 6 hrs for first 48 hours then as needed for pain 11/27/13   Tsosie Billing D, PA-C  oxyCODONE (OXY IR/ROXICODONE) 5 MG immediate release tablet Take 1 tablet (5 mg total)  by mouth every 4 (four) hours as needed for severe pain. 11/27/13   Tsosie Billing D, PA-C  Probiotic Product (ALIGN PO) Take 1 tablet by mouth daily as needed. For digestion    [provider]  promethazine (PHENERGAN) 12.5 MG tablet Take 1 tablet (12.5 mg total) by mouth every 6 (six) hours as needed for nausea. 11/27/13   Tsosie Billing D, PA-C  propranolol ER (INDERAL LA) 80 MG 24 hr capsule Take 80 mg by mouth every morning.    [provider]  triamterene-hydrochlorothiazide (MAXZIDE-25) 37.5-25 MG per tablet Take 0.5 tablets by mouth every morning.     [provider]    Family History History reviewed. No pertinent family history.  Social History Social History   Tobacco Use  .  Smoking status: Never Smoker  . Smokeless tobacco: Never Used  Substance Use Topics  . Alcohol use: Yes    Comment: rare wine  . Drug use: No     Allergies   Doxycycline   Review of Systems Review of Systems  Respiratory: Negative for shortness of breath.   Cardiovascular: Negative for chest pain.  Musculoskeletal: Positive for joint swelling and myalgias. Negative for arthralgias and gait problem.  Skin: Negative for color change.  Neurological: Negative for weakness and numbness.  All other systems reviewed and are negative.    Physical Exam Updated Vital Signs BP 126/60   Pulse 63   Temp 98.3 F (36.8 C) (Oral)   Resp 18   Ht _0  (1.702 m)   Wt 85.7 kg (189 lb)   LMP 02/07/2017   SpO2 97%   BMI 29.60 kg/m   Physical Exam  Constitutional: She is oriented to person, place, and time. She appears well-developed and well-nourished. No distress.  HENT:  Head: Normocephalic and atraumatic.  Eyes: Conjunctivae are normal. Pupils are equal, round, and reactive to light. Right eye exhibits no discharge. Left eye exhibits no discharge. No scleral icterus.  Neck: Normal range of motion.  Cardiovascular: Normal rate.  Pulmonary/Chest: Effort normal. No respiratory distress.  Abdominal: She exhibits no distension.  Musculoskeletal:  Left leg: Patient has varicose veins. No obvious swelling, deformity, or warmth. Tenderness to palpation over mid calf with deep palpation. Tenderness of the extensor tendons of the foot. FROM of ankle and knee. N/V intact.   Neurological: She is alert and oriented to person, place, and time.  Skin: Skin is warm and dry.  Psychiatric: She has a normal mood and affect. Her behavior is normal.  Nursing note and vitals reviewed.    ED Treatments / Results  Labs (all labs ordered are listed, but only abnormal results are displayed) Labs Reviewed - No data to display  EKG  EKG Interpretation None       Radiology US Venous Img Lower  Unilateral Left  Result Date: 02/14/2017 CLINICAL DATA:  LEFT calf pain for 2 days EXAM: LEFT LOWER EXTREMITY VENOUS DOPPLER ULTRASOUND TECHNIQUE: Gray-scale sonography with graded compression, as well as color Doppler and duplex ultrasound were performed to evaluate the lower extremity deep venous systems from the level of the common femoral vein and including the common femoral, femoral, profunda femoral, popliteal and calf veins including the posterior tibial, peroneal and gastrocnemius veins when visible. The superficial great saphenous vein was also interrogated. Spectral Doppler was utilized to evaluate flow at rest and with distal augmentation maneuvers in the common femoral, femoral and popliteal veins. COMPARISON:  None FINDINGS: Contralateral Common Femoral Vein: Respiratory phasicity is  normal and symmetric with the symptomatic side. No evidence of thrombus. Normal compressibility. Common Femoral Vein: No evidence of thrombus. Normal compressibility, respiratory phasicity and response to augmentation. Saphenofemoral Junction: No evidence of thrombus. Normal compressibility and flow on color Doppler imaging. Profunda Femoral Vein: No evidence of thrombus. Normal compressibility and flow on color Doppler imaging. Femoral Vein: No evidence of thrombus. Normal compressibility, respiratory phasicity and response to augmentation. Popliteal Vein: No evidence of thrombus. Normal compressibility, respiratory phasicity and response to augmentation. Calf Veins: No evidence of thrombus. Normal compressibility and flow on color Doppler imaging. Superficial Great Saphenous Vein: No evidence of thrombus. Normal compressibility. Venous Reflux:  None. Other Findings:  None. IMPRESSION: No evidence of deep venous thrombosis in the LEFT lower extremity. Electronically Signed   By: Lavonia Dana M.D.   On: 02/14/2017 22:36    Procedures Procedures (including critical care time)  Medications Ordered in ED Medications -  No data to display   Initial Impression / Assessment and Plan / ED Course  I have reviewed the triage vital signs and the nursing notes.  Pertinent labs & imaging results that were available during my care of the patient were reviewed by me and considered in my medical decision making (see chart for details).  51 year old female presents with left calf pain and left foot pain. Vitals are normal. LE DVT study is negative. Unclear etiology but most likely MSK related. She has pain over extensor tendons of foot so possibly tendonitis. There is no evidence of infection. She was advised to continue conservative management and follow up with her PCP.   Final Clinical Impressions(s) / ED Diagnoses   Final diagnoses:  Pain of left calf  Swelling of left foot    ED Discharge Orders    None       Recardo Evangelist, PA-C 02/15/17 1701    Veryl Speak, MD 02/15/17 2048

## 2017-02-21 DIAGNOSIS — M791 Myalgia, unspecified site: Secondary | ICD-10-CM | POA: Diagnosis not present

## 2017-02-21 DIAGNOSIS — M533 Sacrococcygeal disorders, not elsewhere classified: Secondary | ICD-10-CM | POA: Diagnosis not present

## 2017-02-21 DIAGNOSIS — Z8261 Family history of arthritis: Secondary | ICD-10-CM | POA: Diagnosis not present

## 2017-02-21 DIAGNOSIS — E669 Obesity, unspecified: Secondary | ICD-10-CM | POA: Diagnosis not present

## 2017-02-21 DIAGNOSIS — Z6830 Body mass index (BMI) 30.0-30.9, adult: Secondary | ICD-10-CM | POA: Diagnosis not present

## 2017-02-28 DIAGNOSIS — M25461 Effusion, right knee: Secondary | ICD-10-CM | POA: Diagnosis not present

## 2017-02-28 DIAGNOSIS — M1711 Unilateral primary osteoarthritis, right knee: Secondary | ICD-10-CM | POA: Diagnosis not present

## 2017-03-09 MED FILL — TRIAMTERENE-HCTZ 37.5-25 MG: 37.5-25 | 90 days supply | Qty: 45 | Fill #2

## 2017-03-09 MED FILL — PROPRANOLOL ER 80 MG CAP: 80 | 90 days supply | Qty: 90 | Fill #2

## 2017-03-09 MED FILL — INTEGRA CAPSULE: 62.5-62.5-4 | 30 days supply | Qty: 90 | Fill #0

## 2017-03-09 MED FILL — MELOXICAM 15 MG TABLET: 15 | 90 days supply | Qty: 90 | Fill #1

## 2017-03-10 MED FILL — DEXILANT DR 60 MG CAPSULE: 60 | 30 days supply | Qty: 30 | Fill #0

## 2017-03-27 DIAGNOSIS — M1711 Unilateral primary osteoarthritis, right knee: Secondary | ICD-10-CM | POA: Diagnosis not present

## 2017-04-03 DIAGNOSIS — M1711 Unilateral primary osteoarthritis, right knee: Secondary | ICD-10-CM | POA: Diagnosis not present

## 2017-04-03 DIAGNOSIS — M25561 Pain in right knee: Secondary | ICD-10-CM | POA: Diagnosis not present

## 2017-04-10 DIAGNOSIS — M1711 Unilateral primary osteoarthritis, right knee: Secondary | ICD-10-CM | POA: Diagnosis not present

## 2017-04-23 DIAGNOSIS — M25511 Pain in right shoulder: Secondary | ICD-10-CM | POA: Diagnosis not present

## 2017-04-23 DIAGNOSIS — M542 Cervicalgia: Secondary | ICD-10-CM | POA: Diagnosis not present

## 2017-04-23 DIAGNOSIS — M25512 Pain in left shoulder: Secondary | ICD-10-CM | POA: Diagnosis not present

## 2017-04-24 DIAGNOSIS — Z8261 Family history of arthritis: Secondary | ICD-10-CM | POA: Diagnosis not present

## 2017-04-24 DIAGNOSIS — Z6831 Body mass index (BMI) 31.0-31.9, adult: Secondary | ICD-10-CM | POA: Diagnosis not present

## 2017-04-24 DIAGNOSIS — M1711 Unilateral primary osteoarthritis, right knee: Secondary | ICD-10-CM | POA: Diagnosis not present

## 2017-04-24 DIAGNOSIS — E669 Obesity, unspecified: Secondary | ICD-10-CM | POA: Diagnosis not present

## 2017-04-24 DIAGNOSIS — M791 Myalgia, unspecified site: Secondary | ICD-10-CM | POA: Diagnosis not present

## 2017-04-24 DIAGNOSIS — M533 Sacrococcygeal disorders, not elsewhere classified: Secondary | ICD-10-CM | POA: Diagnosis not present

## 2017-04-28 DIAGNOSIS — Z01419 Encounter for gynecological examination (general) (routine) without abnormal findings: Secondary | ICD-10-CM | POA: Diagnosis not present

## 2017-04-28 DIAGNOSIS — Z6829 Body mass index (BMI) 29.0-29.9, adult: Secondary | ICD-10-CM | POA: Diagnosis not present

## 2017-05-08 DIAGNOSIS — Z1151 Encounter for screening for human papillomavirus (HPV): Secondary | ICD-10-CM | POA: Diagnosis not present

## 2017-05-08 DIAGNOSIS — Z124 Encounter for screening for malignant neoplasm of cervix: Secondary | ICD-10-CM | POA: Diagnosis not present

## 2017-05-17 MED FILL — TRIAMTERENE-HCTZ 37.5-25 MG: 37.5-25 | 90 days supply | Qty: 90 | Fill #0

## 2017-06-07 DIAGNOSIS — M1711 Unilateral primary osteoarthritis, right knee: Secondary | ICD-10-CM | POA: Diagnosis not present

## 2017-06-13 MED FILL — PROPRANOLOL ER 80 MG CAP: 80 | 90 days supply | Qty: 90 | Fill #3

## 2017-06-22 DIAGNOSIS — R140 Abdominal distension (gaseous): Secondary | ICD-10-CM | POA: Diagnosis not present

## 2017-06-22 DIAGNOSIS — K219 Gastro-esophageal reflux disease without esophagitis: Secondary | ICD-10-CM | POA: Diagnosis not present

## 2017-06-23 MED FILL — OMEPRAZOLE DR 40 MG CAPSULE: 40 | 30 days supply | Qty: 30 | Fill #0

## 2017-06-27 DIAGNOSIS — Z5181 Encounter for therapeutic drug level monitoring: Secondary | ICD-10-CM | POA: Diagnosis not present

## 2017-06-27 DIAGNOSIS — M79672 Pain in left foot: Secondary | ICD-10-CM | POA: Diagnosis not present

## 2017-06-27 DIAGNOSIS — I83899 Varicose veins of unspecified lower extremities with other complications: Secondary | ICD-10-CM | POA: Diagnosis not present

## 2017-06-27 DIAGNOSIS — M4126 Other idiopathic scoliosis, lumbar region: Secondary | ICD-10-CM | POA: Diagnosis not present

## 2017-06-27 DIAGNOSIS — M461 Sacroiliitis, not elsewhere classified: Secondary | ICD-10-CM | POA: Diagnosis not present

## 2017-07-19 DIAGNOSIS — M5441 Lumbago with sciatica, right side: Secondary | ICD-10-CM | POA: Diagnosis not present

## 2017-07-19 DIAGNOSIS — M79672 Pain in left foot: Secondary | ICD-10-CM | POA: Diagnosis not present

## 2017-07-24 MED FILL — OMEPRAZOLE 40 MG CPDR: 40 | 90 days supply | Qty: 90 | Fill #1

## 2017-07-25 DIAGNOSIS — M4126 Other idiopathic scoliosis, lumbar region: Secondary | ICD-10-CM | POA: Diagnosis not present

## 2017-07-25 DIAGNOSIS — M79672 Pain in left foot: Secondary | ICD-10-CM | POA: Diagnosis not present

## 2017-07-25 DIAGNOSIS — M25561 Pain in right knee: Secondary | ICD-10-CM | POA: Diagnosis not present

## 2017-07-25 DIAGNOSIS — M461 Sacroiliitis, not elsewhere classified: Secondary | ICD-10-CM | POA: Diagnosis not present

## 2017-07-25 DIAGNOSIS — R232 Flushing: Secondary | ICD-10-CM | POA: Diagnosis not present

## 2017-07-25 DIAGNOSIS — Z0000 Encounter for general adult medical examination without abnormal findings: Secondary | ICD-10-CM | POA: Diagnosis not present

## 2017-07-25 DIAGNOSIS — Z13220 Encounter for screening for lipoid disorders: Secondary | ICD-10-CM | POA: Diagnosis not present

## 2017-07-25 DIAGNOSIS — I83899 Varicose veins of unspecified lower extremities with other complications: Secondary | ICD-10-CM | POA: Diagnosis not present

## 2017-07-25 DIAGNOSIS — Z5181 Encounter for therapeutic drug level monitoring: Secondary | ICD-10-CM | POA: Diagnosis not present

## 2017-08-02 DIAGNOSIS — M5441 Lumbago with sciatica, right side: Secondary | ICD-10-CM | POA: Diagnosis not present

## 2017-08-03 ENCOUNTER — Other Ambulatory Visit: Payer: Self-pay | Admitting: Podiatry

## 2017-08-03 ENCOUNTER — Ambulatory Visit (INDEPENDENT_AMBULATORY_CARE_PROVIDER_SITE_OTHER): Payer: 59

## 2017-08-03 ENCOUNTER — Ambulatory Visit (INDEPENDENT_AMBULATORY_CARE_PROVIDER_SITE_OTHER): Payer: 59 | Admitting: Podiatry

## 2017-08-03 ENCOUNTER — Encounter: Payer: Self-pay | Admitting: Podiatry

## 2017-08-03 VITALS — BP 125/73 | HR 63 | Resp 16

## 2017-08-03 DIAGNOSIS — M79672 Pain in left foot: Secondary | ICD-10-CM

## 2017-08-03 DIAGNOSIS — M722 Plantar fascial fibromatosis: Secondary | ICD-10-CM

## 2017-08-03 DIAGNOSIS — I152 Hypertension secondary to endocrine disorders: Secondary | ICD-10-CM | POA: Insufficient documentation

## 2017-08-03 DIAGNOSIS — I10 Essential (primary) hypertension: Secondary | ICD-10-CM | POA: Insufficient documentation

## 2017-08-03 MED ORDER — TRIAMCINOLONE ACETONIDE 10 MG/ML IJ SUSP
10.0000 mg | Freq: Once | INTRAMUSCULAR | Status: AC
  Administered 2017-08-03: 10 mg

## 2017-08-03 MED ORDER — PREDNISONE 10 MG PO TABS: ORAL_TABLET | ORAL | 0 refills | Status: DC

## 2017-08-03 NOTE — Progress Notes (Signed)
Subjective:   Patient ID: Kimberly Boyd, female   DOB: 52 y.o.   MRN: [id number]   HPI Patient presents stating she has had problems with the bottom and top of her left foot for the last several months and does not remember specific injury.  Also has trouble with her right knee and hits her left foot that bothers her and she did have an injection on the top from her orthopedic which did not help her.  Patient is not a smoker and likes to be active but is not been able to due to the pain she is in   Review of Systems  All other systems reviewed and are negative.       Objective:  Physical Exam  Constitutional: She appears well-developed and well-nourished.  Cardiovascular: Intact distal pulses.  Pulmonary/Chest: Effort normal.  Musculoskeletal: Normal range of motion.  Neurological: She is alert.  Skin: Skin is warm.  Nursing note and vitals reviewed.   Neurovascular status intact muscle strength is adequate range of motion within normal limits with patient noted to have quite a bit of discomfort on the plantar aspect of the left foot in the distal portion of the plantar fascia around the second and third metatarsals.  There is also mild discomfort dorsally but I did not note there to be pathology from this standpoint.  Patient was noted to have good digital perfusion well oriented x3 with moderate depression of the arch     Assessment:  Distal fasciitis left with inflammatory condition and chronic pain with palpation     Plan:  H&P condition reviewed and today I did do a distal fascial injection 3 mg Kenalog 5 mg Xylocaine and I applied air fracture walker to completely immobilize the plantar foot.  I then placed on Deltasone 12-day steroid pack to try to reduce the inflammatory complex and reappoint 2 weeks and may require physical therapy or possible MRI if symptoms continue  X-rays indicate moderate depression of the arch with no indications of further pathology

## 2017-08-03 NOTE — Patient Instructions (Signed)

## 2017-08-18 ENCOUNTER — Ambulatory Visit: Payer: 59 | Admitting: Podiatry

## 2017-08-23 MED FILL — predniSONE 10 MG (48) TBPK: 10 | 12 days supply | Qty: 48 | Fill #0

## 2017-08-28 ENCOUNTER — Encounter: Payer: Self-pay | Admitting: Podiatry

## 2017-08-28 ENCOUNTER — Ambulatory Visit: Payer: 59 | Admitting: Podiatry

## 2017-08-28 DIAGNOSIS — M7752 Other enthesopathy of left foot: Secondary | ICD-10-CM | POA: Diagnosis not present

## 2017-08-28 DIAGNOSIS — M779 Enthesopathy, unspecified: Secondary | ICD-10-CM

## 2017-08-28 MED ORDER — TRIAMCINOLONE ACETONIDE 10 MG/ML IJ SUSP
10.0000 mg | Freq: Once | INTRAMUSCULAR | Status: AC
  Administered 2017-08-28: 10 mg

## 2017-08-28 NOTE — Progress Notes (Signed)
Subjective:   Patient ID: Dannielle Karvonen, female   DOB: 52 y.o.   MRN: [id number]   HPI Patient presents stating she is some improved from previously but she is having pain more distal into the joints of her second and third and she knows probably long-term she will need some type of an orthotic   ROS      Objective:  Physical Exam  Neurovascular status intact with inflammation pain of the second and third metatarsal phalangeal joints left with fluid buildup localized in nature     Assessment:  Inflammatory capsulitis second and third metatarsal phalangeal joint left     Plan:  H&P conditions reviewed and recommended steroidal injection and aspiration procedures.  I discussed rigid bottom shoes continued boot usage but we are going to try this to see if we can get symptoms under control.  I did proximal nerve block of left foot and under sterile technique with sterile instrumentation I aspirated the second and third metatarsal phalangeal joint getting out a small amount of fluid and then injected quarter cc Dexasone Kenalog into each joint and discussed long-term orthotics for the patient.  Reappoint for [place] to recheck again in 2 weeks to see the response to medication

## 2017-09-04 MED FILL — TRIAMTERENE-HCTZ 37.5-25 MG: 37.5-25 | 90 days supply | Qty: 90 | Fill #0

## 2017-09-21 MED FILL — PROPRANOLOL ER 80 MG CAP: 80 | 90 days supply | Qty: 90 | Fill #0

## 2017-09-29 ENCOUNTER — Encounter: Payer: Self-pay | Admitting: Podiatry

## 2017-09-29 ENCOUNTER — Ambulatory Visit: Payer: 59 | Admitting: Podiatry

## 2017-09-29 DIAGNOSIS — M779 Enthesopathy, unspecified: Secondary | ICD-10-CM | POA: Diagnosis not present

## 2017-09-29 MED ORDER — DICLOFENAC SODIUM 75 MG PO TBEC: 75.0000 mg | DELAYED_RELEASE_TABLET | Freq: Two times a day (BID) | ORAL | 2 refills | Status: DC

## 2017-09-29 MED FILL — DICLOFENAC SOD EC 75 MG TAB: 75 | 25 days supply | Qty: 50 | Fill #0

## 2017-10-02 ENCOUNTER — Telehealth: Payer: Self-pay | Admitting: *Deleted

## 2017-10-02 DIAGNOSIS — M779 Enthesopathy, unspecified: Secondary | ICD-10-CM

## 2017-10-02 NOTE — Progress Notes (Signed)
Subjective:   Patient ID: Kimberly Boyd, female   DOB: 52 y.o.   MRN: [id number]   HPI Patient presents stating she is still having pain in her left foot around the joints and she has not been wearing as rigid of shoes as I would like.  Patient states that she is still having problems with activity   ROS      Objective:  Physical Exam  Neurovascular status intact muscle strength is adequate range of motion within normal limits with inflammation pain of the second and third metatarsal phalangeal joint still noted of the left foot     Assessment:  Inflammatory capsulitis still noted second and third metatarsal phalangeal joints left     Plan:  Reviewed condition and we are going to continue to try conservative care with continued changes in shoe gear habits along with rigid bottom shoes and utilization of boot.  We will continue anti-inflammatory's stretching exercises and we will see him back in 4 weeks and ultimately this patient may end up requiring shortening type osteotomies we will continue to try to work with it conservatively before considering that

## 2017-10-02 NOTE — Telephone Encounter (Signed)
[employer] - In-office.

## 2017-10-06 DIAGNOSIS — N926 Irregular menstruation, unspecified: Secondary | ICD-10-CM | POA: Diagnosis not present

## 2017-10-06 DIAGNOSIS — F419 Anxiety disorder, unspecified: Secondary | ICD-10-CM | POA: Diagnosis not present

## 2017-10-06 DIAGNOSIS — R232 Flushing: Secondary | ICD-10-CM | POA: Diagnosis not present

## 2017-10-06 MED FILL — ALPRAZolam 0.5 MG TABS: 0.5 | 10 days supply | Qty: 30 | Fill #0

## 2017-10-23 DIAGNOSIS — B9689 Other specified bacterial agents as the cause of diseases classified elsewhere: Secondary | ICD-10-CM | POA: Diagnosis not present

## 2017-10-23 DIAGNOSIS — L820 Inflamed seborrheic keratosis: Secondary | ICD-10-CM | POA: Diagnosis not present

## 2017-10-23 DIAGNOSIS — L0232 Furuncle of buttock: Secondary | ICD-10-CM | POA: Diagnosis not present

## 2017-10-27 ENCOUNTER — Ambulatory Visit: Payer: 59 | Admitting: Podiatry

## 2017-11-06 DIAGNOSIS — I83899 Varicose veins of unspecified lower extremities with other complications: Secondary | ICD-10-CM | POA: Diagnosis not present

## 2017-11-06 DIAGNOSIS — Z5181 Encounter for therapeutic drug level monitoring: Secondary | ICD-10-CM | POA: Diagnosis not present

## 2017-11-06 DIAGNOSIS — M461 Sacroiliitis, not elsewhere classified: Secondary | ICD-10-CM | POA: Diagnosis not present

## 2017-11-06 DIAGNOSIS — M79672 Pain in left foot: Secondary | ICD-10-CM | POA: Diagnosis not present

## 2017-11-06 DIAGNOSIS — Z780 Asymptomatic menopausal state: Secondary | ICD-10-CM | POA: Diagnosis not present

## 2017-11-06 DIAGNOSIS — M25561 Pain in right knee: Secondary | ICD-10-CM | POA: Diagnosis not present

## 2017-11-06 DIAGNOSIS — E559 Vitamin D deficiency, unspecified: Secondary | ICD-10-CM | POA: Diagnosis not present

## 2017-11-06 DIAGNOSIS — M4126 Other idiopathic scoliosis, lumbar region: Secondary | ICD-10-CM | POA: Diagnosis not present

## 2017-11-06 DIAGNOSIS — Z13220 Encounter for screening for lipoid disorders: Secondary | ICD-10-CM | POA: Diagnosis not present

## 2017-11-27 ENCOUNTER — Other Ambulatory Visit: Payer: Self-pay

## 2017-11-27 DIAGNOSIS — I83893 Varicose veins of bilateral lower extremities with other complications: Secondary | ICD-10-CM

## 2017-11-29 DIAGNOSIS — M1711 Unilateral primary osteoarthritis, right knee: Secondary | ICD-10-CM | POA: Diagnosis not present

## 2017-12-04 MED FILL — TRIAMTERENE/HCTZ 37.5/25 TB: 37.5-25 | 90 days supply | Qty: 90 | Fill #0

## 2017-12-04 MED FILL — DICLOFENAC SOD EC 75 MG TAB: 75 | 25 days supply | Qty: 50 | Fill #1

## 2017-12-06 ENCOUNTER — Encounter: Payer: Self-pay | Admitting: Vascular Surgery

## 2017-12-06 ENCOUNTER — Ambulatory Visit (INDEPENDENT_AMBULATORY_CARE_PROVIDER_SITE_OTHER): Payer: 59 | Admitting: Vascular Surgery

## 2017-12-06 ENCOUNTER — Ambulatory Visit (HOSPITAL_COMMUNITY)
Admission: RE | Admit: 2017-12-06 | Discharge: 2017-12-06 | Disposition: A | Discharge status: Home / Self Care | Payer: 59 | Source: Ambulatory Visit | Attending: Vascular Surgery | Admitting: Vascular Surgery

## 2017-12-06 ENCOUNTER — Other Ambulatory Visit: Payer: Self-pay

## 2017-12-06 VITALS — BP 117/80 | HR 64 | Resp 18 | Ht 67.0 in | Wt 197.0 lb

## 2017-12-06 DIAGNOSIS — I83813 Varicose veins of bilateral lower extremities with pain: Secondary | ICD-10-CM

## 2017-12-06 DIAGNOSIS — I872 Venous insufficiency (chronic) (peripheral): Secondary | ICD-10-CM | POA: Insufficient documentation

## 2017-12-06 DIAGNOSIS — I83893 Varicose veins of bilateral lower extremities with other complications: Secondary | ICD-10-CM

## 2017-12-06 DIAGNOSIS — R600 Localized edema: Secondary | ICD-10-CM | POA: Insufficient documentation

## 2017-12-06 DIAGNOSIS — M1711 Unilateral primary osteoarthritis, right knee: Secondary | ICD-10-CM | POA: Diagnosis not present

## 2017-12-06 DIAGNOSIS — R609 Edema, unspecified: Secondary | ICD-10-CM | POA: Diagnosis present

## 2017-12-06 NOTE — Progress Notes (Signed)
Referring Physician: Dr Jacelyn Grip  Patient name: Kimberly Boyd MRN: [id number] DOB: October 06, 1965 Sex: female  REASON FOR CONSULT: Symptomatic varicose veins with pain  HPI: Kimberly Boyd is a 52 y.o. female, with a several month history of heaviness achiness in the legs and lower back.  She denies prior history of DVT.  She does have some swelling in her legs at the end of the day.  She does not really notice her symptoms getting better if she is standing all day.  However, she does sit at a desk all day and at the end of the day her legs are worse than during the beginning of the day.  This is been going on for about 6 months.  She does have a family history of varicose veins in her mother.  She is also scheduled to have an MRI of her back in the near future as part of the work-up for this.  She was referred by Dr. Jacelyn Grip for further evaluation as he was suspicious that varicose veins could be causing her leg symptoms as well.     Past Medical History:  Diagnosis Date  . Back pain years.    chronic low back pain   . Complication of anesthesia    tachycardia, difficulty waking up  . GERD (gastroesophageal reflux disease)   . Heart murmur    questionable  . Hypertension   . Palpitations   . Palpitations 2011   takes  propanolol    Past Surgical History:  Procedure Laterality Date  . cervical dysplasia laser    . dermoid cyst removal    . IR GENERIC HISTORICAL  06/04/2014   IR RADIOLOGIST EVAL & MGMT 06/04/2014 Sandi Mariscal, MD [facility] INTERV RAD  . LAPAROSCOPIC CHOLECYSTECTOMY    . LIPOMA RESECTION     and redone  . ORIF ANKLE FRACTURE  08/09/2011   Procedure: OPEN REDUCTION INTERNAL FIXATION (ORIF) ANKLE FRACTURE;  Surgeon: Wylene Simmer, MD;  Location: [facility];  Service: Orthopedics;  Laterality: Right;  ORIF Right Ankle Lateral Malleolus  . scar revision for the lipoma      History reviewed. No pertinent family history.  SOCIAL HISTORY: Social History   Socioeconomic History  .  Marital status: Married    Spouse name: Not on file  . Number of children: Not on file  . Years of education: Not on file  . Highest education level: Not on file  Occupational History  . Not on file  Social Needs  . Financial resource strain: Not on file  . Food insecurity:    Worry: Not on file    Inability: Not on file  . Transportation needs:    Medical: Not on file    Non-medical: Not on file  Tobacco Use  . Smoking status: Never Smoker  . Smokeless tobacco: Never Used  Substance and Sexual Activity  . Alcohol use: Yes    Comment: rare wine  . Drug use: No  . Sexual activity: Yes    Partners: Male  Lifestyle  . Physical activity:    Days per week: Not on file    Minutes per session: Not on file  . Stress: Not on file  Relationships  . Social connections:    Talks on phone: Not on file    Gets together: Not on file    Attends religious service: Not on file    Active member of club or organization: Not on file    Attends meetings of  clubs or organizations: Not on file    Relationship status: Not on file  . Intimate partner violence:    Fear of current or ex partner: Not on file    Emotionally abused: Not on file    Physically abused: Not on file    Forced sexual activity: Not on file  Other Topics Concern  . Not on file  Social History Narrative   Married with 2 children. 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     Allergies  Allergen Reactions  . Doxycycline Other (See Comments)    Esophagitis; opened up in my esophagus    Current Outpatient Medications  Medication Sig Dispense Refill  . ALPRAZolam (XANAX) 0.5 MG tablet Take 0.5 mg by mouth as needed for anxiety.    . diclofenac (VOLTAREN) 75 MG EC tablet Take 1 tablet (75 mg total) by mouth 2 (two) times daily. 50 tablet 2  . docusate sodium (COLACE) 100 MG capsule Take 1 capsule (100 mg total) by mouth 2 (two) times daily. 20 capsule 0  . Fe Fum-FePoly-FA-Vit C-Vit B3  (INTEGRA F PO) Take 1 tablet by mouth every morning.     Marland Kitchen ibuprofen (ADVIL) 200 MG tablet Take 3 tablets (600 mg total) by mouth every 6 (six) hours as needed. Take every 6 hrs for first 48 hours then as needed for pain 30 tablet 0  . omeprazole (PRILOSEC) 40 MG capsule   12  . Probiotic Product (ALIGN PO) Take 1 tablet by mouth daily as needed. For digestion    . propranolol ER (INDERAL LA) 80 MG 24 hr capsule Take 80 mg by mouth every morning.    . triamterene-hydrochlorothiazide (MAXZIDE-25) 37.5-25 MG per tablet Take 0.5 tablets by mouth every morning.     Marland Kitchen dexlansoprazole (DEXILANT) 60 MG capsule Take 60 mg by mouth every morning.     . meloxicam (MOBIC) 15 MG tablet Take 15 mg by mouth as needed for pain.    Marland Kitchen oxyCODONE (OXY IR/ROXICODONE) 5 MG immediate release tablet Take 1 tablet (5 mg total) by mouth every 4 (four) hours as needed for severe pain. (Patient not taking: Reported on 12/06/2017) 30 tablet 0  . predniSONE (DELTASONE) 10 MG tablet 12 day tapering dose (Patient not taking: Reported on 12/06/2017) 48 tablet 0  . promethazine (PHENERGAN) 12.5 MG tablet Take 1 tablet (12.5 mg total) by mouth every 6 (six) hours as needed for nausea. (Patient not taking: Reported on 12/06/2017) 30 tablet 0   No current facility-administered medications for this visit.     ROS:   General:  No weight loss, Fever, chills  HEENT: No recent headaches, no nasal bleeding, no visual changes, no sore throat  Neurologic: No dizziness, blackouts, seizures. No recent symptoms of stroke or mini- stroke. No recent episodes of slurred speech, or temporary blindness.  Cardiac: No recent episodes of chest pain/pressure, no shortness of breath at rest.  No shortness of breath with exertion.  Denies history of atrial fibrillation or irregular heartbeat  Vascular: No history of rest pain in feet.  No history of claudication.  No history of non-healing ulcer, No history of DVT   Pulmonary: No home oxygen, no  productive cough, no hemoptysis,  No asthma or wheezing  Musculoskeletal:  [ ]  Arthritis, [X]  Low back pain,  [ ]  Joint pain  Hematologic:No history of hypercoagulable state.  No history of easy bleeding.  + history of anemia  Gastrointestinal: No hematochezia  or melena,  No gastroesophageal reflux, no trouble swallowing  Urinary: [ ]  chronic Kidney disease, [ ]  on HD - [ ]  MWF or [ ]  TTHS, [ ]  Burning with urination, [ ]  Frequent urination, [ ]  Difficulty urinating;   Skin: No rashes  Psychological: No history of anxiety,  No history of depression   Physical Examination  Vitals:   12/06/17 1210  BP: 117/80  Pulse: 64  Resp: 18  SpO2: 100%  Weight: 197 lb (89.4 kg)  Height: 5\' 7"  (1.702 m)    Body mass index is 30.85 kg/m.  General:  Alert and oriented, no acute distress HEENT: Normal Neck: No bruit or JVD Pulmonary: Clear to auscultation bilaterally Cardiac: Regular Rate and Rhythm without murmur Abdomen: Soft, non-tender, non-distended, no mass Skin: No rash, scattered spider type varicosities lateral medial thigh bilaterally calf area bilaterally no large prominent varicosities Extremity Pulses:  2+ radial, brachial, femoral, dorsalis pedis, posterior tibial pulses bilaterally Musculoskeletal: No deformity or edema  Neurologic: Upper and lower extremity motor 5/5 and symmetric  DATA:  Patient had a venous duplex exam examining for reflux today.  This showed greater saphenous vein diameter about 5 mm bilaterally.  However no reflux noted in the deep or superficial system.  ASSESSMENT: Bilateral lower extremity leg pain.  Her greater saphenous vein was enlarged a little bit but no reflux noted in the deep or superficial system at this point.  She is undergoing other testing for further evaluation and etiology of her symptoms.  Certainly some of her symptoms may be related to her veins.   PLAN: Patient was given a prescription today for long leg lower extremity  compression stockings today to see if she gets some symptomatic relief.  She will return in 3 months time and we will repeat her venous duplex at that time to see if there is any evidence of reflux.  If she does have evidence of reflux in superficial system we could consider laser ablation.  However if the vein physiologically is still normal and most likely compression alone will be the mainstay of therapy.  She will also have completed more of her work-up at that time which may have further explanations for her leg pain.   Ruta Hinds, MD [facility] Office: [phone] Pager: [phone]

## 2017-12-08 MED FILL — PROPRANOLOL ER 80 MG CAP: 80 | 90 days supply | Qty: 90 | Fill #0

## 2017-12-13 ENCOUNTER — Other Ambulatory Visit: Payer: Self-pay | Admitting: Obstetrics and Gynecology

## 2017-12-13 DIAGNOSIS — M1711 Unilateral primary osteoarthritis, right knee: Secondary | ICD-10-CM | POA: Diagnosis not present

## 2017-12-13 DIAGNOSIS — Z1231 Encounter for screening mammogram for malignant neoplasm of breast: Secondary | ICD-10-CM

## 2017-12-14 DIAGNOSIS — M79672 Pain in left foot: Secondary | ICD-10-CM | POA: Diagnosis not present

## 2017-12-15 ENCOUNTER — Other Ambulatory Visit (HOSPITAL_COMMUNITY): Payer: Self-pay | Admitting: Orthopaedic Surgery

## 2017-12-15 DIAGNOSIS — M84375A Stress fracture, left foot, initial encounter for fracture: Secondary | ICD-10-CM

## 2017-12-25 ENCOUNTER — Ambulatory Visit (HOSPITAL_COMMUNITY)
Admission: RE | Admit: 2017-12-25 | Discharge: 2017-12-25 | Disposition: A | Discharge status: Home / Self Care | Payer: 59 | Source: Ambulatory Visit | Attending: Orthopaedic Surgery | Admitting: Orthopaedic Surgery

## 2017-12-25 ENCOUNTER — Encounter (HOSPITAL_COMMUNITY): Payer: Self-pay

## 2017-12-25 DIAGNOSIS — M84375A Stress fracture, left foot, initial encounter for fracture: Secondary | ICD-10-CM | POA: Insufficient documentation

## 2017-12-25 DIAGNOSIS — X58XXXA Exposure to other specified factors, initial encounter: Secondary | ICD-10-CM | POA: Diagnosis not present

## 2017-12-25 DIAGNOSIS — M19072 Primary osteoarthritis, left ankle and foot: Secondary | ICD-10-CM | POA: Diagnosis not present

## 2017-12-28 DIAGNOSIS — M19072 Primary osteoarthritis, left ankle and foot: Secondary | ICD-10-CM | POA: Diagnosis not present

## 2017-12-28 MED FILL — PROPRANOLOL ER 80 MG CAP: 80 | 90 days supply | Qty: 90 | Fill #0

## 2018-01-05 ENCOUNTER — Other Ambulatory Visit: Payer: Self-pay

## 2018-01-05 DIAGNOSIS — I83893 Varicose veins of bilateral lower extremities with other complications: Secondary | ICD-10-CM

## 2018-01-05 DIAGNOSIS — I83813 Varicose veins of bilateral lower extremities with pain: Secondary | ICD-10-CM

## 2018-01-16 ENCOUNTER — Ambulatory Visit
Admission: RE | Admit: 2018-01-16 | Discharge: 2018-01-16 | Disposition: A | Discharge status: Home / Self Care | Payer: 59 | Source: Ambulatory Visit | Attending: Obstetrics and Gynecology | Admitting: Obstetrics and Gynecology

## 2018-01-16 ENCOUNTER — Encounter: Payer: Self-pay | Admitting: Radiology

## 2018-01-16 DIAGNOSIS — Z1231 Encounter for screening mammogram for malignant neoplasm of breast: Secondary | ICD-10-CM

## 2018-01-26 DIAGNOSIS — M19072 Primary osteoarthritis, left ankle and foot: Secondary | ICD-10-CM | POA: Diagnosis not present

## 2018-01-29 ENCOUNTER — Ambulatory Visit: Payer: 59 | Admitting: Physical Therapy

## 2018-02-05 ENCOUNTER — Other Ambulatory Visit: Payer: Self-pay

## 2018-02-05 ENCOUNTER — Encounter: Payer: Self-pay | Admitting: Physical Therapy

## 2018-02-05 ENCOUNTER — Ambulatory Visit: Payer: 59 | Attending: Orthopaedic Surgery | Admitting: Physical Therapy

## 2018-02-05 DIAGNOSIS — R262 Difficulty in walking, not elsewhere classified: Secondary | ICD-10-CM | POA: Insufficient documentation

## 2018-02-05 DIAGNOSIS — M545 Low back pain, unspecified: Secondary | ICD-10-CM

## 2018-02-05 DIAGNOSIS — R29898 Other symptoms and signs involving the musculoskeletal system: Secondary | ICD-10-CM | POA: Diagnosis not present

## 2018-02-05 DIAGNOSIS — G8929 Other chronic pain: Secondary | ICD-10-CM | POA: Insufficient documentation

## 2018-02-05 DIAGNOSIS — M6281 Muscle weakness (generalized): Secondary | ICD-10-CM | POA: Insufficient documentation

## 2018-02-05 NOTE — Therapy (Signed)
[facility] [address]  [place], [place], [address] Phone: [phone]   Fax:  [phone]  Physical Therapy Evaluation  Patient Details  Name: Kimberly Boyd MRN: [id number] Date of Birth: 01-May-1965 Referring Provider (PT): Melrose Nakayama, MD   Encounter Date: 02/05/2018  PT End of Session - 02/05/18 0929    Visit Number  1    Number of Visits  13    Date for PT Re-Evaluation  03/19/18    Authorization Type  [facility]    PT Start Time  0844    PT Stop Time  0925    PT Time Calculation (min)  41 min    Activity Tolerance  Patient tolerated treatment well    Behavior During Therapy  [facility] for tasks assessed/performed       Past Medical History:  Diagnosis Date  . Back pain years.    chronic low back pain   . Complication of anesthesia    tachycardia, difficulty waking up  . GERD (gastroesophageal reflux disease)   . Heart murmur    questionable  . Hypertension   . Palpitations   . Palpitations 2011   takes  propanolol     Past Surgical History:  Procedure Laterality Date  . cervical dysplasia laser    . dermoid cyst removal    . IR GENERIC HISTORICAL  06/04/2014   IR RADIOLOGIST EVAL & MGMT 06/04/2014 Sandi Mariscal, MD [facility] INTERV RAD  . LAPAROSCOPIC CHOLECYSTECTOMY    . LIPOMA RESECTION     and redone  . ORIF ANKLE FRACTURE  08/09/2011   Procedure: OPEN REDUCTION INTERNAL FIXATION (ORIF) ANKLE FRACTURE;  Surgeon: Wylene Simmer, MD;  Location: [facility];  Service: Orthopedics;  Laterality: Right;  ORIF Right Ankle Lateral Malleolus  . scar revision for the lipoma      There were no vitals filed for this visit.   Subjective Assessment - 02/05/18 0847    Subjective  Patient reports she has had central and L LBP for the last 6-8 months. Denies radiation or N/T. When she performs STS she is stiff and slow to start has trouble with prolonged standing, has pulling when reaching into overhead shelf. Started  working from home in July 2018 and sits all day when working. Reports she also has R knee OA for the past year that she has injections for as well as severe L foot OA with stress rxn. Reports she has been out of walking boot on L foot for the past 2 weeks. Would like to handle all her injuries conservatively at this time. Sometimes she feels like her legs are wobbling with STS- almost like she is afraid she is going to fall.  Bought a stationary bike to lose weight but has not exercised much d/t depression from her current health concerns.     Pertinent History  palpitations, HTN, heart murmur, GERD, back pain, ORIF ankle fx    Limitations  Sitting;Lifting;Standing;House hold activities    How long can you sit comfortably?  2 hours    How long can you stand comfortably?  30 min    How long can you walk comfortably?  30 min but most limited by R knee and L foot    Diagnostic tests  none recent on spine    Patient Stated Goals  relief of pain and discomfort to atleast 1/10, be able to stand longer    Currently in Pain?  Yes  Pain Score  0-No pain    Pain Location  Back    Pain Orientation  Left;Lower    Pain Descriptors / Indicators  Aching   pinching   Pain Type  Chronic pain         [facility] PT Assessment - 02/05/18 0858      Assessment   Medical Diagnosis  LBP due to early mild DJD    Referring Provider (PT)  Melrose Nakayama, MD    Onset Date/Surgical Date  06/05/17    Next MD Visit  --   not scheduled   Prior Therapy  Yes      Precautions   Precautions  --   L foot stress rxn- no jumping or running     Restrictions   Weight Bearing Restrictions  No      Balance Screen   Has the patient fallen in the past 6 months  No    Has the patient had a decrease in activity level because of a fear of falling?   No    Is the patient reluctant to leave their home because of a fear of falling?   No      Home Environment   Living Environment  Private residence    Type of [place] to enter    Entrance Stairs-Number of Steps  4    Entrance Stairs-Rails  None    Home Layout  Two level    Alternate Level Stairs-Number of Steps  15    Alternate Level Stairs-Rails  Right    Home Equipment  Crutches;Walker - 2 wheels      Prior Function   Level of Independence  Independent    Vocation  Full time employment    Vocation Requirements  sitting computer work    Leisure  none      Cognition   Overall Cognitive Status  Within Functional Limits for tasks assessed      Sensation   Light Touch  Appears Intact      Coordination   Gross Motor Movements are Fluid and Coordinated  Yes      Posture/Postural Control   Posture/Postural Control  Postural limitations    Postural Limitations  Rounded Shoulders;Posterior pelvic tilt      ROM / Strength   AROM / PROM / Strength  AROM;Strength      AROM   AROM Assessment Site  Lumbar    Lumbar Flexion  toes    Lumbar Extension  mildly limited   central LBP 3/10   Lumbar - Right Side Bend  distal thigh   "stiff"   Lumbar - Left Side Bend  distal thigh   "stiff"   Lumbar - Right Rotation  WFL    Lumbar - Left Rotation  mildly limited      Strength   Strength Assessment Site  Hip;Knee;Ankle    Right/Left Hip  Right;Left    Right Hip Flexion  4/5    Right Hip ABduction  4+/5    Right Hip ADduction  4/5    Left Hip Flexion  4/5    Left Hip ABduction  4+/5    Left Hip ADduction  4/5    Right/Left Knee  Right;Left    Right Knee Flexion  4+/5    Right Knee Extension  4+/5    Left Knee Flexion  4/5    Left Knee Extension  4/5   mild pain in knee  Right/Left Ankle  Right;Left    Right Ankle Dorsiflexion  4+/5    Right Ankle Plantar Flexion  4+/5    Left Ankle Dorsiflexion  4/5    Left Ankle Plantar Flexion  4+/5      Flexibility   Soft Tissue Assessment /Muscle Length  yes    Hamstrings  mildly tight B LEs    Quadriceps  L LE moderately tight    Piriformis  B mildly tight      Palpation    Palpation comment  L piriformis TTP      Ambulation/Gait   Assistive device  None    Gait Pattern  Step-through pattern;Decreased hip/knee flexion - right   R hip drop               Objective measurements completed on examination: See above findings.              PT Education - 02/05/18 0929    Education Details  prognosis, POC, HEP; edu on use of lumbar roll and proper desk posture to decrease LBP    Person(s) Educated  Patient    Methods  Explanation;Demonstration;Tactile cues;Verbal cues;Handout    Comprehension  Verbalized understanding;Returned demonstration       PT Short Term Goals - 02/05/18 0944      PT SHORT TERM GOAL #1   Title  Patient to be independent with initial HEP.    Time  3    Period  Weeks    Status  New    Target Date  02/26/18        PT Long Term Goals - 02/05/18 0944      PT LONG TERM GOAL #1   Title  Patient to be independent with advanced HEP.    Time  6    Period  Weeks    Status  New    Target Date  03/19/18      PT LONG TERM GOAL #2   Title  Patient to demonstrate [facility] and pain-free lumbar AROM.    Time  6    Period  Weeks    Status  New    Target Date  03/19/18      PT LONG TERM GOAL #3   Title  Patient to demonstrate B LE strength >=4+/5.    Time  6    Period  Weeks    Status  New    Target Date  03/19/18      PT LONG TERM GOAL #4   Title  Patient to report understanding and ability to self correct posture when sitting while working.     Time  6    Period  Weeks    Status  New    Target Date  03/19/18      PT LONG TERM GOAL #5   Title  Patient to report tolerance of 1 hour of standing without pain limiting.     Time  6    Period  Weeks    Status  New    Target Date  03/19/18             Plan - 02/05/18 0930    Clinical Impression Statement  Patient is a 52y/o F presenting to OPPT with c/o insidious onset of L and central LBP for the past 6-8 months. Patient also with current exacerbation of  L foot and R knee OA, likely causing compensatory body mechanics causing current LBP flare. Patient reports that when she performs STS  she is stiff and slow to start, has trouble with prolonged standing, and has pulling in LB when reaching into overhead shelf. 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 Patient today with limited and mildly painful lumbar AROM, decreased B LE strength, decreased LE flexibility, TTP in L piriformis, and gait deviations. Educated on gentle ROM and strengthening HEP as well as edu on sitting posture while working. Patient reported understanding. Would benefit from skilled PT services 2x/week for 6 weeks to address aforementioned impairments.     Clinical Presentation  Stable    Clinical Decision Making  Low    Rehab Potential  Good    Clinical Impairments Affecting Rehab Potential  palpitations, HTN, heart murmur, GERD, back pain, ORIF ankle fx    PT Frequency  2x / week    PT Duration  6 weeks    PT Treatment/Interventions  ADLs/Self Care Home Management;Cryotherapy;Electrical Stimulation;Moist Heat;Traction;Ultrasound;DME Instruction;Gait training;Stair training;Functional mobility training;Therapeutic activities;Therapeutic exercise;Manual techniques;Orthotic Fit/Training;Patient/family education;Neuromuscular re-education;Balance training;Passive range of motion;Dry needling;Energy conservation;Splinting;Taping    PT Next Visit Plan  FOTO, reassess HEP    Consulted and Agree with Plan of Care  Patient       Patient will benefit from skilled therapeutic intervention in order to improve the following deficits and impairments:  Decreased activity tolerance, Decreased strength, Pain, Difficulty walking, Decreased range of motion, Impaired perceived functional ability, Improper body mechanics, Postural dysfunction, Impaired flexibility  Visit Diagnosis: Chronic left-sided low back pain without sciatica  Difficulty in walking, not elsewhere  classified  Muscle weakness (generalized)  Other symptoms and signs involving the musculoskeletal system     Problem List Patient Active Problem List   Diagnosis Date Noted  . Hypertension 08/03/2017  . Osteoarthritis of right knee 05/20/2015  . Fibroid, uterine   . Fever 09/30/2010  . Supraventricular tachycardia-probable concealed accessory pathway 08/31/2010  . Stress 08/31/2010  . ALLERGIC RHINITIS 11/20/2008  . GERD 11/20/2008  . PALPITATIONS 11/20/2008  . CHEST PAIN 11/20/2008   Janene Harvey, PT, DPT 02/05/18 9:48 AM   [facility] [address]  [place], [place], [address] Phone: [phone]   Fax:  [phone]  Name: Kimberly Boyd MRN: [id number] Date of Birth: [birth year]

## 2018-02-13 ENCOUNTER — Encounter

## 2018-02-13 ENCOUNTER — Encounter (HOSPITAL_COMMUNITY): Payer: Self-pay

## 2018-02-13 ENCOUNTER — Encounter: Payer: Self-pay | Admitting: Vascular Surgery

## 2018-02-15 ENCOUNTER — Encounter: Payer: Self-pay | Admitting: Physical Therapy

## 2018-02-15 ENCOUNTER — Ambulatory Visit: Payer: 59 | Attending: Orthopaedic Surgery | Admitting: Physical Therapy

## 2018-02-15 DIAGNOSIS — R29898 Other symptoms and signs involving the musculoskeletal system: Secondary | ICD-10-CM | POA: Insufficient documentation

## 2018-02-15 DIAGNOSIS — G8929 Other chronic pain: Secondary | ICD-10-CM | POA: Diagnosis not present

## 2018-02-15 DIAGNOSIS — M545 Low back pain, unspecified: Secondary | ICD-10-CM

## 2018-02-15 DIAGNOSIS — M6281 Muscle weakness (generalized): Secondary | ICD-10-CM | POA: Insufficient documentation

## 2018-02-15 DIAGNOSIS — R262 Difficulty in walking, not elsewhere classified: Secondary | ICD-10-CM | POA: Insufficient documentation

## 2018-02-15 NOTE — Therapy (Signed)
[facility] [address]  [place], [place], [address] Phone: [phone]   Fax:  [phone]  Physical Therapy Treatment  Patient Details  Name: MONICK RENA MRN: [id number] Date of Birth: 06-11-1965 Referring Provider (PT): Melrose Nakayama, MD   Encounter Date: 02/15/2018  PT End of Session - 02/15/18 1747    Visit Number  2    Number of Visits  13    Date for PT Re-Evaluation  03/19/18    Authorization Type  [facility]    PT Start Time  3903    PT Stop Time  1749    PT Time Calculation (min)  51 min    Activity Tolerance  Patient tolerated treatment well    Behavior During Therapy  [facility] for tasks assessed/performed       Past Medical History:  Diagnosis Date  . Back pain years.    chronic low back pain   . Complication of anesthesia    tachycardia, difficulty waking up  . GERD (gastroesophageal reflux disease)   . Heart murmur    questionable  . Hypertension   . Palpitations   . Palpitations 2011   takes  propanolol     Past Surgical History:  Procedure Laterality Date  . cervical dysplasia laser    . dermoid cyst removal    . IR GENERIC HISTORICAL  06/04/2014   IR RADIOLOGIST EVAL & MGMT 06/04/2014 Sandi Mariscal, MD [facility] INTERV RAD  . LAPAROSCOPIC CHOLECYSTECTOMY    . LIPOMA RESECTION     and redone  . ORIF ANKLE FRACTURE  08/09/2011   Procedure: OPEN REDUCTION INTERNAL FIXATION (ORIF) ANKLE FRACTURE;  Surgeon: Wylene Simmer, MD;  Location: [facility];  Service: Orthopedics;  Laterality: Right;  ORIF Right Ankle Lateral Malleolus  . scar revision for the lipoma      There were no vitals filed for this visit.  Subjective Assessment - 02/15/18 1700    Subjective  Reports that she is hurting today in her back. Reports compliance with HEP.    Pertinent History  palpitations, HTN, heart murmur, GERD, back pain, ORIF ankle fx    Diagnostic tests  none recent on spine    Patient Stated Goals  relief of pain and  discomfort to atleast 1/10, be able to stand longer    Currently in Pain?  Yes    Pain Score  7     Pain Location  Back    Pain Orientation  Mid;Lower    Pain Descriptors / Indicators  Aching;Stabbing    Pain Type  Chronic pain                       [facility] Adult PT Treatment/Exercise - 02/15/18 0001      Self-Care   Self-Care  Other Self-Care Comments    Other Self-Care Comments   ball on wall self STM to B parapsinals and L buttock      Exercises   Exercises  Lumbar      Lumbar Exercises: Stretches   Passive Hamstring Stretch  Right;Left;1 rep;30 seconds    Passive Hamstring Stretch Limitations  supine strap    Other Lumbar Stretch Exercise  TrA activation edu and assistance with self-palpation x5 min      Lumbar Exercises: Supine   Pelvic Tilt  10 reps    Pelvic Tilt Limitations  cues on proper form    Bent Knee Raise  20 reps  Bent Knee Raise Limitations  red TB around toes    Bridge with [employer]  15 reps    Bridge with [employer] Limitations  cues for glute activation    Other Supine Lumbar Exercises  LTR x20    cues to avoid pain   Other Supine Lumbar Exercises  overhead yellow medball reach x10   cues for speed and pos pelvic tilt     Lumbar Exercises: Sidelying   Clam  Right;Left;10 reps   cues to avoid trunk rotation   Clam Limitations  red TB around knees      Modalities   Modalities  Electrical Stimulation;Moist Heat      Moist Heat Therapy   Number Minutes Moist Heat  10 Minutes    Moist Heat Location  Lumbar Spine      Electrical Stimulation   Electrical Stimulation Location  B lumbar paraspinals and glutes    Electrical Stimulation Action  IFC    Electrical Stimulation Parameters  80-150 hz; output 12 to tol; 10 min    Electrical Stimulation Goals  Pain             PT Education - 02/15/18 1746    Education Details  updated HEP; administered red TB; administered handout on TrA; edu on use of ball on wall massage     Person(s) Educated  Patient    Methods  Explanation;Demonstration;Tactile cues;Verbal cues;Handout    Comprehension  Verbalized understanding;Returned demonstration       PT Short Term Goals - 02/15/18 1753      PT SHORT TERM GOAL #1   Title  Patient to be independent with initial HEP.    Time  3    Period  Weeks    Status  Achieved        PT Long Term Goals - 02/15/18 1754      PT LONG TERM GOAL #1   Title  Patient to be independent with advanced HEP.    Time  6    Period  Weeks    Status  On-going      PT LONG TERM GOAL #2   Title  Patient to demonstrate [facility] and pain-free lumbar AROM.    Time  6    Period  Weeks    Status  On-going      PT LONG TERM GOAL #3   Title  Patient to demonstrate B LE strength >=4+/5.    Time  6    Period  Weeks    Status  On-going      PT LONG TERM GOAL #4   Title  Patient to report understanding and ability to self correct posture when sitting while working.     Time  6    Period  Weeks    Status  On-going      PT LONG TERM GOAL #5   Title  Patient to report tolerance of 1 hour of standing without pain limiting.     Time  6    Period  Weeks    Status  On-going            Plan - 02/15/18 1748    Clinical Impression Statement  Patient arrived to session with report of compliance with HEP, however reports that she is in pain today. Reviewed HEP which patient had no issues with. Educated patient on TrA activation which patient had trouble grasping after several verbal and manual cues. Updated HEP to include this exercise to practice at  home as well as informative handout. Patient with good understanding of pelvic tilts and able to maintain posterior pelvic tilt and core contraction with overhead medball lifts. Introduced clamshells with banded resistance with cues to avoid trunk rotation. Ended session with e-stim and moist heat to LB as patient reporting being in increased pain today. Normal integumentary response observed and  patient reporting decrease in pain to 4/10 at end of session.    Clinical Impairments Affecting Rehab Potential  palpitations, HTN, heart murmur, GERD, back pain, ORIF ankle fx    PT Treatment/Interventions  ADLs/Self Care Home Management;Cryotherapy;Electrical Stimulation;Moist Heat;Traction;Ultrasound;DME Instruction;Gait training;Stair training;Functional mobility training;Therapeutic activities;Therapeutic exercise;Manual techniques;Orthotic Fit/Training;Patient/family education;Neuromuscular re-education;Balance training;Passive range of motion;Dry needling;Energy conservation;Splinting;Taping    PT Next Visit Plan  progress core and LE strengthening    Consulted and Agree with Plan of Care  Patient       Patient will benefit from skilled therapeutic intervention in order to improve the following deficits and impairments:  Decreased activity tolerance, Decreased strength, Pain, Difficulty walking, Decreased range of motion, Impaired perceived functional ability, Improper body mechanics, Postural dysfunction, Impaired flexibility  Visit Diagnosis: Chronic left-sided low back pain without sciatica  Difficulty in walking, not elsewhere classified  Muscle weakness (generalized)  Other symptoms and signs involving the musculoskeletal system     Problem List Patient Active Problem List   Diagnosis Date Noted  . Hypertension 08/03/2017  . Osteoarthritis of right knee 05/20/2015  . Fibroid, uterine   . Fever 09/30/2010  . Supraventricular tachycardia-probable concealed accessory pathway 08/31/2010  . Stress 08/31/2010  . ALLERGIC RHINITIS 11/20/2008  . GERD 11/20/2008  . PALPITATIONS 11/20/2008  . CHEST PAIN 11/20/2008    Janene Harvey, PT, DPT 02/15/18 5:56 PM   [facility] [address]  [place], [place], [address] Phone: [phone]   Fax:  [phone]  Name: MARQUASIA SCHMIEDER MRN: [id number] Date of  Birth: [birth year]

## 2018-02-23 ENCOUNTER — Ambulatory Visit: Payer: 59

## 2018-02-23 DIAGNOSIS — M545 Low back pain, unspecified: Secondary | ICD-10-CM

## 2018-02-23 DIAGNOSIS — R262 Difficulty in walking, not elsewhere classified: Secondary | ICD-10-CM

## 2018-02-23 DIAGNOSIS — G8929 Other chronic pain: Secondary | ICD-10-CM

## 2018-02-23 DIAGNOSIS — M6281 Muscle weakness (generalized): Secondary | ICD-10-CM

## 2018-02-23 DIAGNOSIS — R29898 Other symptoms and signs involving the musculoskeletal system: Secondary | ICD-10-CM | POA: Diagnosis not present

## 2018-02-23 NOTE — Therapy (Signed)
[facility] [address]  [place], [place], [address] Phone: [phone]   Fax:  [phone]  Physical Therapy Treatment  Patient Details  Name: Kimberly Boyd MRN: [id number] Date of Birth: 03-05-1966 Referring Provider (PT): Melrose Nakayama, MD   Encounter Date: 02/23/2018  PT End of Session - 02/23/18 1107    Visit Number  3    Number of Visits  13    Date for PT Re-Evaluation  03/19/18    Authorization Type  [facility]    PT Start Time  1102    PT Stop Time  1208    PT Time Calculation (min)  66 min    Activity Tolerance  Patient tolerated treatment well    Behavior During Therapy  [facility] for tasks assessed/performed       Past Medical History:  Diagnosis Date  . Back pain years.    chronic low back pain   . Complication of anesthesia    tachycardia, difficulty waking up  . GERD (gastroesophageal reflux disease)   . Heart murmur    questionable  . Hypertension   . Palpitations   . Palpitations 2011   takes  propanolol     Past Surgical History:  Procedure Laterality Date  . cervical dysplasia laser    . dermoid cyst removal    . IR GENERIC HISTORICAL  06/04/2014   IR RADIOLOGIST EVAL & MGMT 06/04/2014 Sandi Mariscal, MD [facility] INTERV RAD  . LAPAROSCOPIC CHOLECYSTECTOMY    . LIPOMA RESECTION     and redone  . ORIF ANKLE FRACTURE  08/09/2011   Procedure: OPEN REDUCTION INTERNAL FIXATION (ORIF) ANKLE FRACTURE;  Surgeon: Wylene Simmer, MD;  Location: [facility];  Service: Orthopedics;  Laterality: Right;  ORIF Right Ankle Lateral Malleolus  . scar revision for the lipoma      There were no vitals filed for this visit.  Subjective Assessment - 02/23/18 1106    Subjective  Pt. reporting she wishes to review HEP to check for TA exercise is correct form.      Pertinent History  palpitations, HTN, heart murmur, GERD, back pain, ORIF ankle fx    Diagnostic tests  none recent on spine    Patient Stated Goals  relief of  pain and discomfort to atleast 1/10, be able to stand longer    Currently in Pain?  No/denies    Pain Score  1     Pain Location  Back    Pain Orientation  Mid    Multiple Pain Sites  No                       [facility] Adult PT Treatment/Exercise - 02/23/18 1230      Self-Care   Self-Care  Other Self-Care Comments    Other Self-Care Comments   Discussed proper sleep hygeine with pt. as to promote improved sleep quality       Lumbar Exercises: Stretches   Single Knee to Chest Stretch  Right;Left;1 rep;30 seconds    Single Knee to Chest Stretch Limitations  B    Piriformis Stretch  Right;Left;30 seconds;2 reps    Piriformis Stretch Limitations  KTOS       Lumbar Exercises: Aerobic   Nustep  Lvl 3, 7 min       Lumbar Exercises: Standing   Other Standing Lumbar Exercises  Side stepping, forward/backwards monster walking with red TB at ankles 2 x 25 ft  cues provided to remind pt. to brace abdominals     Lumbar Exercises: Supine   Dead Bug  10 reps;3 seconds    Dead Bug Limitations  opposite LE resting on table     Bridge with [employer]  15 reps    Bridge with [employer] Limitations  adduction ball squeeze    Bridge with clamshell  3 seconds   x 12 reps    Bridge with [employer] Limitations  with isometric hip abd/ER into red TB at knees     Isometric Hip Flexion  10 reps;5 seconds    Isometric Hip Flexion Limitations  with heels on peanut p-ball       Lumbar Exercises: Sidelying   Clam  Right;Left;10 reps   min cueing required for proper elevation    Clam Limitations  red TB around knees      Moist Heat Therapy   Number Minutes Moist Heat  15 Minutes    Moist Heat Location  Lumbar Spine      Electrical Stimulation   Electrical Stimulation Location  B lumbar paraspinals and glutes    Electrical Stimulation Action  IFC    Electrical Stimulation Parameters  to tolerance, 15'    Electrical Stimulation Goals  Pain             PT Education -  02/23/18 1240    Education Details  HEP update     Person(s) Educated  Patient    Methods  Explanation;Demonstration;Verbal cues;Handout    Comprehension  Verbalized understanding;Need further instruction;Returned demonstration;Verbal cues required       PT Short Term Goals - 02/15/18 1753      PT SHORT TERM GOAL #1   Title  Patient to be independent with initial HEP.    Time  3    Period  Weeks    Status  Achieved        PT Long Term Goals - 02/15/18 1754      PT LONG TERM GOAL #1   Title  Patient to be independent with advanced HEP.    Time  6    Period  Weeks    Status  On-going      PT LONG TERM GOAL #2   Title  Patient to demonstrate [facility] and pain-free lumbar AROM.    Time  6    Period  Weeks    Status  On-going      PT LONG TERM GOAL #3   Title  Patient to demonstrate B LE strength >=4+/5.    Time  6    Period  Weeks    Status  On-going      PT LONG TERM GOAL #4   Title  Patient to report understanding and ability to self correct posture when sitting while working.     Time  6    Period  Weeks    Status  On-going      PT LONG TERM GOAL #5   Title  Patient to report tolerance of 1 hour of standing without pain limiting.     Time  6    Period  Weeks    Status  On-going            Plan - 02/23/18 1108    Clinical Impression Statement  Pt. reporting she wishes to review latest HEP activities to check for proper technique.  Pt. required min cueing with transverse abdom. HEP exercise with review for proper core activation.  Tolerated  progression of lumbopelvic strengthening activities well today without pain.  Did have low-level baseline pain to end session thus applied E-stim/moist heat to lumbar spine as pt. noting benefit from this.  Will continue to progress per pt. response in coming visits.      Clinical Impairments Affecting Rehab Potential  palpitations, HTN, heart murmur, GERD, back pain, ORIF ankle fx    PT Frequency  2x / week    PT Duration  6  weeks    PT Treatment/Interventions  ADLs/Self Care Home Management;Cryotherapy;Electrical Stimulation;Moist Heat;Traction;Ultrasound;DME Instruction;Gait training;Stair training;Functional mobility training;Therapeutic activities;Therapeutic exercise;Manual techniques;Orthotic Fit/Training;Patient/family education;Neuromuscular re-education;Balance training;Passive range of motion;Dry needling;Energy conservation;Splinting;Taping    PT Next Visit Plan  Continue; progress core and LE strengthening    Consulted and Agree with Plan of Care  Patient       Patient will benefit from skilled therapeutic intervention in order to improve the following deficits and impairments:  Decreased activity tolerance, Decreased strength, Pain, Difficulty walking, Decreased range of motion, Impaired perceived functional ability, Improper body mechanics, Postural dysfunction, Impaired flexibility  Visit Diagnosis: Chronic left-sided low back pain without sciatica  Difficulty in walking, not elsewhere classified  Muscle weakness (generalized)  Other symptoms and signs involving the musculoskeletal system     Problem List Patient Active Problem List   Diagnosis Date Noted  . Hypertension 08/03/2017  . Osteoarthritis of right knee 05/20/2015  . Fibroid, uterine   . Fever 09/30/2010  . Supraventricular tachycardia-probable concealed accessory pathway 08/31/2010  . Stress 08/31/2010  . ALLERGIC RHINITIS 11/20/2008  . GERD 11/20/2008  . PALPITATIONS 11/20/2008  . CHEST PAIN 11/20/2008    Bess Harvest, PTA 02/23/18 12:42 PM   [facility] [address]  [place], [place], [address] Phone: [phone]   Fax:  [phone]  Name: DEEANN SERVIDIO MRN: [id number] Date of Birth: [birth year]

## 2018-02-27 ENCOUNTER — Ambulatory Visit: Payer: 59

## 2018-03-09 ENCOUNTER — Encounter: Payer: Self-pay | Admitting: Physical Therapy

## 2018-03-10 DIAGNOSIS — H524 Presbyopia: Secondary | ICD-10-CM | POA: Diagnosis not present

## 2018-03-10 DIAGNOSIS — H5203 Hypermetropia, bilateral: Secondary | ICD-10-CM | POA: Diagnosis not present

## 2018-03-13 ENCOUNTER — Encounter: Payer: Self-pay | Admitting: Physical Therapy

## 2018-03-13 ENCOUNTER — Ambulatory Visit: Payer: 59 | Admitting: Physical Therapy

## 2018-03-13 DIAGNOSIS — G8929 Other chronic pain: Secondary | ICD-10-CM

## 2018-03-13 DIAGNOSIS — M545 Low back pain, unspecified: Secondary | ICD-10-CM

## 2018-03-13 DIAGNOSIS — R262 Difficulty in walking, not elsewhere classified: Secondary | ICD-10-CM

## 2018-03-13 DIAGNOSIS — M6281 Muscle weakness (generalized): Secondary | ICD-10-CM | POA: Diagnosis not present

## 2018-03-13 DIAGNOSIS — R29898 Other symptoms and signs involving the musculoskeletal system: Secondary | ICD-10-CM

## 2018-03-13 NOTE — Therapy (Signed)
[facility] [address]  [place], [place], [address] Phone: [phone]   Fax:  [phone]  Physical Therapy Treatment  Patient Details  Name: Kimberly Boyd MRN: [id number] Date of Birth: Oct 19, 1965 Referring Provider (PT): Melrose Nakayama, MD   Encounter Date: 03/13/2018  PT End of Session - 03/13/18 0951    Visit Number  4    Number of Visits  13    Date for PT Re-Evaluation  03/19/18    Authorization Type  [facility]    PT Start Time  0804    PT Stop Time  0842    PT Time Calculation (min)  38 min    Activity Tolerance  Patient tolerated treatment well    Behavior During Therapy  [facility] for tasks assessed/performed       Past Medical History:  Diagnosis Date  . Back pain years.    chronic low back pain   . Complication of anesthesia    tachycardia, difficulty waking up  . GERD (gastroesophageal reflux disease)   . Heart murmur    questionable  . Hypertension   . Palpitations   . Palpitations 2011   takes  propanolol     Past Surgical History:  Procedure Laterality Date  . cervical dysplasia laser    . dermoid cyst removal    . IR GENERIC HISTORICAL  06/04/2014   IR RADIOLOGIST EVAL & MGMT 06/04/2014 Sandi Mariscal, MD [facility] INTERV RAD  . LAPAROSCOPIC CHOLECYSTECTOMY    . LIPOMA RESECTION     and redone  . ORIF ANKLE FRACTURE  08/09/2011   Procedure: OPEN REDUCTION INTERNAL FIXATION (ORIF) ANKLE FRACTURE;  Surgeon: Wylene Simmer, MD;  Location: [facility];  Service: Orthopedics;  Laterality: Right;  ORIF Right Ankle Lateral Malleolus  . scar revision for the lipoma      There were no vitals filed for this visit.  Subjective Assessment - 03/13/18 0806    Subjective  Reports that she has been doing better, as long as she stays consistent with HEP.    Pertinent History  palpitations, HTN, heart murmur, GERD, back pain, ORIF ankle fx    Diagnostic tests  none recent on spine    Patient Stated Goals  relief of  pain and discomfort to atleast 1/10, be able to stand longer    Currently in Pain?  No/denies                       [facility] Adult PT Treatment/Exercise - 03/13/18 0001      Lumbar Exercises: Stretches   Passive Hamstring Stretch  Right;Left;30 seconds;2 reps    Passive Hamstring Stretch Limitations  supine strap      Lumbar Exercises: Aerobic   Nustep  Lvl 3 x 6 min       Lumbar Exercises: Supine   Pelvic Tilt  15 reps    Pelvic Tilt Limitations  good carryover    Bridge  10 reps    Bridge Limitations  B LEs straight on orange pball    Bridge with March  10 reps   cues to decrease amplitude of march to maintain hips up   Other Supine Lumbar Exercises  overhead yellow medball reach x10   cues to decrease speed     Lumbar Exercises: Sidelying   Clam  Right;Left;10 reps    Clam Limitations  Angelo TB around knees      Lumbar Exercises: Quadruped   Madcat/Old  Horse  10 reps    Madcat/Old Horse Limitations  cues for rhythmic breathing    Other Quadruped Lumbar Exercises  thread the needle 10x each side             PT Education - 03/13/18 0845    Education Details  update and consolidation of HEP; administered Salehi TB; edu about personal TENS unit for pain relief    Person(s) Educated  Patient    Methods  Explanation;Demonstration;Tactile cues;Verbal cues;Handout    Comprehension  Verbalized understanding;Returned demonstration       PT Short Term Goals - 02/15/18 1753      PT SHORT TERM GOAL #1   Title  Patient to be independent with initial HEP.    Time  3    Period  Weeks    Status  Achieved        PT Long Term Goals - 02/15/18 1754      PT LONG TERM GOAL #1   Title  Patient to be independent with advanced HEP.    Time  6    Period  Weeks    Status  On-going      PT LONG TERM GOAL #2   Title  Patient to demonstrate [facility] and pain-free lumbar AROM.    Time  6    Period  Weeks    Status  On-going      PT LONG TERM GOAL #3   Title   Patient to demonstrate B LE strength >=4+/5.    Time  6    Period  Weeks    Status  On-going      PT LONG TERM GOAL #4   Title  Patient to report understanding and ability to self correct posture when sitting while working.     Time  6    Period  Weeks    Status  On-going      PT LONG TERM GOAL #5   Title  Patient to report tolerance of 1 hour of standing without pain limiting.     Time  6    Period  Weeks    Status  On-going            Plan - 03/13/18 3154    Clinical Impression Statement  Patient arrived to session with report of improvement in symptoms as long as she stays consistent with HEP. Reports she has been practicing TrA contraction- today TrA activation palpable however still co-contracting rectus abdominus and difficulty isolating this muscle group. Good carryover of pelvic tilts today, especially when given abdominal challenge with overhead medball lifts. Able to progress bridges with march and with LEs straight on physioball. Patient demonstrated increased difficulty with clamshell on R, however still able to progress to [place] banded resistance. Worked on quadruped exercises for progression of lumbopelvic ROM with good tolerance. Updated and consolidated HEP with current exercises and administered Petzold band. Provided edu about personal TENS unit for pain relief. Patient reported understanding and with no complaints at end of session.     Clinical Impairments Affecting Rehab Potential  palpitations, HTN, heart murmur, GERD, back pain, ORIF ankle fx    PT Treatment/Interventions  ADLs/Self Care Home Management;Cryotherapy;Electrical Stimulation;Moist Heat;Traction;Ultrasound;DME Instruction;Gait training;Stair training;Functional mobility training;Therapeutic activities;Therapeutic exercise;Manual techniques;Orthotic Fit/Training;Patient/family education;Neuromuscular re-education;Balance training;Passive range of motion;Dry needling;Energy conservation;Splinting;Taping     PT Next Visit Plan  progress core and LE strengthening    Consulted and Agree with Plan of Care  Patient       Patient will  benefit from skilled therapeutic intervention in order to improve the following deficits and impairments:  Decreased activity tolerance, Decreased strength, Pain, Difficulty walking, Decreased range of motion, Impaired perceived functional ability, Improper body mechanics, Postural dysfunction, Impaired flexibility  Visit Diagnosis: Chronic left-sided low back pain without sciatica  Difficulty in walking, not elsewhere classified  Muscle weakness (generalized)  Other symptoms and signs involving the musculoskeletal system     Problem List Patient Active Problem List   Diagnosis Date Noted  . Hypertension 08/03/2017  . Osteoarthritis of right knee 05/20/2015  . Fibroid, uterine   . Fever 09/30/2010  . Supraventricular tachycardia-probable concealed accessory pathway 08/31/2010  . Stress 08/31/2010  . ALLERGIC RHINITIS 11/20/2008  . GERD 11/20/2008  . PALPITATIONS 11/20/2008  . CHEST PAIN 11/20/2008    Janene Harvey, PT, DPT 03/13/18 9:56 AM   [facility] [address]  [place], [place], [address] Phone: [phone]   Fax:  [phone]  Name: Kimberly Boyd MRN: [id number] Date of Birth: Jul 25, 1965

## 2018-03-16 MED FILL — TRIAMTERENE/HCTZ 37.5/25 TB: 37.5-25 | 90 days supply | Qty: 90 | Fill #0

## 2018-03-21 ENCOUNTER — Encounter: Payer: Self-pay | Admitting: Physical Therapy

## 2018-03-21 ENCOUNTER — Ambulatory Visit: Payer: 59 | Attending: Orthopaedic Surgery | Admitting: Physical Therapy

## 2018-03-21 DIAGNOSIS — R262 Difficulty in walking, not elsewhere classified: Secondary | ICD-10-CM | POA: Diagnosis not present

## 2018-03-21 DIAGNOSIS — M6281 Muscle weakness (generalized): Secondary | ICD-10-CM | POA: Diagnosis not present

## 2018-03-21 DIAGNOSIS — M545 Low back pain, unspecified: Secondary | ICD-10-CM

## 2018-03-21 DIAGNOSIS — R29898 Other symptoms and signs involving the musculoskeletal system: Secondary | ICD-10-CM | POA: Insufficient documentation

## 2018-03-21 DIAGNOSIS — G8929 Other chronic pain: Secondary | ICD-10-CM | POA: Diagnosis not present

## 2018-03-21 NOTE — Therapy (Addendum)
[facility] [address]  [place], [place], [address] Phone: [phone]   Fax:  [phone]  Physical Therapy Treatment  Patient Details  Name: Kimberly Boyd MRN: [id number] Date of Birth: May 24, 1965 Referring Provider (PT): Melrose Nakayama, MD   Encounter Date: 03/21/2018  PT End of Session - 03/21/18 1511    Visit Number  5    Number of Visits  13    Date for PT Re-Evaluation  03/19/18    Authorization Type  [facility]    PT Start Time  1321   patient late   PT Stop Time  1359    PT Time Calculation (min)  38 min    Activity Tolerance  Patient tolerated treatment well;Patient limited by pain    Behavior During Therapy  [facility] for tasks assessed/performed       Past Medical History:  Diagnosis Date  . Back pain years.    chronic low back pain   . Complication of anesthesia    tachycardia, difficulty waking up  . GERD (gastroesophageal reflux disease)   . Heart murmur    questionable  . Hypertension   . Palpitations   . Palpitations 2011   takes  propanolol     Past Surgical History:  Procedure Laterality Date  . cervical dysplasia laser    . dermoid cyst removal    . IR GENERIC HISTORICAL  06/04/2014   IR RADIOLOGIST EVAL & MGMT 06/04/2014 Sandi Mariscal, MD [facility] INTERV RAD  . LAPAROSCOPIC CHOLECYSTECTOMY    . LIPOMA RESECTION     and redone  . ORIF ANKLE FRACTURE  08/09/2011   Procedure: OPEN REDUCTION INTERNAL FIXATION (ORIF) ANKLE FRACTURE;  Surgeon: Wylene Simmer, MD;  Location: [place];  Service: Orthopedics;  Laterality: Right;  ORIF Right Ankle Lateral Malleolus  . scar revision for the lipoma      There were no vitals filed for this visit.  Subjective Assessment - 03/21/18 1320    Subjective  Report back has been okay- had a couple days when it was flared up. Is wondering if her mattress has something to do with it.     Pertinent History  palpitations, HTN, heart murmur, GERD, back pain, ORIF ankle fx     Diagnostic tests  none recent on spine    Patient Stated Goals  relief of pain and discomfort to atleast 1/10, be able to stand longer    Currently in Pain?  No/denies                       [facility] Adult PT Treatment/Exercise - 03/21/18 0001      Lumbar Exercises: Stretches   Hip Flexor Stretch  Right;Left;30 seconds;Limitations;2 reps    Hip Flexor Stretch Limitations  mod thomas strap       Lumbar Exercises: Aerobic   Nustep  Lvl 3 x 6 min       Lumbar Exercises: Standing   Other Standing Lumbar Exercises  mini squat with orange ball on back x10   still with c/o R knee pain   Other Standing Lumbar Exercises  sidestepping with red TB around toes 2x80f      Lumbar Exercises: Seated   Sit to Stand  10 reps   touching bottom to foam; mild c/o R knee pain   Other Seated Lumbar Exercises  B hip ER with Trani TB and ball; hip IR with red TB and ball x15 each  Lumbar Exercises: Sidelying   Clam  Right;Left;15 reps    Clam Limitations  Mikrut TB around knees      Lumbar Exercises: Quadruped   Madcat/Old Horse  10 reps    Straight Leg Raise  10 reps    Straight Leg Raises Limitations  fire hydrants with cues to avoid trunk rotation- better on R LE    Other Quadruped Lumbar Exercises  child pose 10x5"   limited knee flexion d/t pain   Other Quadruped Lumbar Exercises  donkey kicks x10 each LE   manual cues to avoid hip rotation              PT Short Term Goals - 02/15/18 1753      PT SHORT TERM GOAL #1   Title  Patient to be independent with initial HEP.    Time  3    Period  Weeks    Status  Achieved        PT Long Term Goals - 02/15/18 1754      PT LONG TERM GOAL #1   Title  Patient to be independent with advanced HEP.    Time  6    Period  Weeks    Status  On-going      PT LONG TERM GOAL #2   Title  Patient to demonstrate [facility] and pain-free lumbar AROM.    Time  6    Period  Weeks    Status  On-going      PT LONG TERM GOAL #3    Title  Patient to demonstrate B LE strength >=4+/5.    Time  6    Period  Weeks    Status  On-going      PT LONG TERM GOAL #4   Title  Patient to report understanding and ability to self correct posture when sitting while working.     Time  6    Period  Weeks    Status  On-going      PT LONG TERM GOAL #5   Title  Patient to report tolerance of 1 hour of standing without pain limiting.     Time  6    Period  Weeks    Status  On-going            Plan - 03/21/18 1511    Clinical Impression Statement  Patient arrived late to session. Reports she had flare up of back pain for a couple days- wondering if it could be due to her mattress. Has been getting full body massages which she benefits from. Worked on progress hip strengthening activities today. 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 Patient with more difficulty maintaining neutral positioning on L LE. Worked on squats and mini wall squats, however patient limited by R knee pain. Patient with c/o mild LBP after sidestepping with banded resistance, however pain was eased with cat/cow stretching. No complaints at end of session. Patient showing good progress with PT thus far.     Clinical Impairments Affecting Rehab Potential  palpitations, HTN, heart murmur, GERD, back pain, ORIF ankle fx    PT Treatment/Interventions  ADLs/Self Care Home Management;Cryotherapy;Electrical Stimulation;Moist Heat;Traction;Ultrasound;DME Instruction;Gait training;Stair training;Functional mobility training;Therapeutic activities;Therapeutic exercise;Manual techniques;Orthotic Fit/Training;Patient/family education;Neuromuscular re-education;Balance training;Passive range of motion;Dry needling;Energy conservation;Splinting;Taping    PT Next Visit Plan  progress core and LE strengthening    Consulted and Agree with Plan of Care  Patient       Patient  will benefit from skilled therapeutic intervention in  order to improve the following deficits and impairments:  Decreased activity tolerance, Decreased strength, Pain, Difficulty walking, Decreased range of motion, Impaired perceived functional ability, Improper body mechanics, Postural dysfunction, Impaired flexibility  Visit Diagnosis: Chronic left-sided low back pain without sciatica  Difficulty in walking, not elsewhere classified  Muscle weakness (generalized)  Other symptoms and signs involving the musculoskeletal system     Problem List Patient Active Problem List   Diagnosis Date Noted  . Hypertension 08/03/2017  . Osteoarthritis of right knee 05/20/2015  . Fibroid, uterine   . Fever 09/30/2010  . Supraventricular tachycardia-probable concealed accessory pathway 08/31/2010  . Stress 08/31/2010  . ALLERGIC RHINITIS 11/20/2008  . GERD 11/20/2008  . PALPITATIONS 11/20/2008  . CHEST PAIN 11/20/2008    Janene Harvey, PT, DPT 03/21/18 3:15 PM    [facility] [address]  [place], [place], [address] Phone: [phone]   Fax:  [phone]  Name: Kimberly Boyd MRN: [id number] Date of Birth: 1965-10-09  PHYSICAL THERAPY DISCHARGE SUMMARY  Visits from Start of Care: 5  Current functional level related to goals / functional outcomes: Unable to assess; patient did not return d/t father being ill   Remaining deficits: Unable to assess   Education / Equipment: HEP  Plan: Patient agrees to discharge.  Patient goals were not met. Patient is being discharged due to not returning since the last visit.  ?????     Janene Harvey, PT, DPT 04/23/18 9:46 AM

## 2018-04-03 MED FILL — PROPRANOLOL ER 80 MG CAP: 80 | 90 days supply | Qty: 90 | Fill #0

## 2018-04-04 ENCOUNTER — Ambulatory Visit: Payer: Self-pay | Admitting: Vascular Surgery

## 2018-04-04 ENCOUNTER — Inpatient Hospital Stay (HOSPITAL_COMMUNITY): Admission: RE | Admit: 2018-04-04 | Payer: Self-pay | Source: Ambulatory Visit

## 2018-04-11 DIAGNOSIS — M545 Low back pain: Secondary | ICD-10-CM | POA: Diagnosis not present

## 2018-04-11 DIAGNOSIS — M6281 Muscle weakness (generalized): Secondary | ICD-10-CM | POA: Diagnosis not present

## 2018-04-11 DIAGNOSIS — G8929 Other chronic pain: Secondary | ICD-10-CM | POA: Diagnosis not present

## 2018-04-11 DIAGNOSIS — R262 Difficulty in walking, not elsewhere classified: Secondary | ICD-10-CM | POA: Diagnosis not present

## 2018-04-12 DIAGNOSIS — M25561 Pain in right knee: Secondary | ICD-10-CM | POA: Diagnosis not present

## 2018-04-12 DIAGNOSIS — Z780 Asymptomatic menopausal state: Secondary | ICD-10-CM | POA: Diagnosis not present

## 2018-04-12 DIAGNOSIS — Z5181 Encounter for therapeutic drug level monitoring: Secondary | ICD-10-CM | POA: Diagnosis not present

## 2018-04-12 DIAGNOSIS — E7800 Pure hypercholesterolemia, unspecified: Secondary | ICD-10-CM | POA: Diagnosis not present

## 2018-04-12 DIAGNOSIS — M4126 Other idiopathic scoliosis, lumbar region: Secondary | ICD-10-CM | POA: Diagnosis not present

## 2018-04-12 DIAGNOSIS — M461 Sacroiliitis, not elsewhere classified: Secondary | ICD-10-CM | POA: Diagnosis not present

## 2018-04-12 DIAGNOSIS — I83899 Varicose veins of unspecified lower extremities with other complications: Secondary | ICD-10-CM | POA: Diagnosis not present

## 2018-04-12 DIAGNOSIS — E559 Vitamin D deficiency, unspecified: Secondary | ICD-10-CM | POA: Diagnosis not present

## 2018-04-12 DIAGNOSIS — M79606 Pain in leg, unspecified: Secondary | ICD-10-CM | POA: Diagnosis not present

## 2018-05-01 ENCOUNTER — Encounter: Payer: Self-pay | Admitting: Dietician

## 2018-05-01 ENCOUNTER — Encounter: Payer: 59 | Attending: Family Medicine | Admitting: Dietician

## 2018-05-01 DIAGNOSIS — E669 Obesity, unspecified: Secondary | ICD-10-CM | POA: Diagnosis not present

## 2018-05-01 DIAGNOSIS — Z6833 Body mass index (BMI) 33.0-33.9, adult: Secondary | ICD-10-CM | POA: Insufficient documentation

## 2018-05-01 DIAGNOSIS — E6609 Other obesity due to excess calories: Secondary | ICD-10-CM | POA: Insufficient documentation

## 2018-05-01 NOTE — Patient Instructions (Addendum)
   Aim to get 30 to 45 minutes of exercise 3 days / week. Remember your stationary bike!   Remember to incorporate carbohydrate and protein foods into meals and snacks. Spread carbohydrate intake throughout the day, focusing on prioritizing most carbohydrate intake earlier in the day.   Utilize the "Sleep Hygiene" handout for tips on regulating sleep schedule.

## 2018-05-01 NOTE — Progress Notes (Signed)
Medical Nutrition Therapy  Appt Start Time: 11:15am End Time: 12:20pm  Primary concerns today: weight maintenance + accountability   Preferred learning style: no preference indicated Learning readiness: ready/change in progress   NUTRITION ASSESSMENT   Anthropometrics  Weight: 201.6  BMI: 31.58 kg/m2  Clinical Medical Hx: obesity, HTN, GERD, gout, arthritis (right knee), vitamin D deficiency Surgeries: cholecystectomy, lipoma resection, dermoid cyst removal Allergies: doxycycline  Medications: omeprazole, propranolol, triamterene, diclofenac  Psychosocial/Lifestyle Pt formerly worked as a Marine scientist for 30 years, 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 They plan to go to [place] this year. Pt states she drinks socially occasionally. Pt states she is often stressed and does not sleep well. Pt states she often gets her "screentime" on her iPad at bedtime, but lately has tried utilizing the "Calm" app to relax before bed. Pt is very knowledgeable about food/nutrition and interested in learning more. Pt is personable and was engaged during today's session, and states she would like accountability and be reminded to "make good choices."   24-Hr Dietary Recall First Meal: smoothies (kale, spinach, flax, chia, frozen fruit) (or toast + fried egg, or oatmeal/ oat bran)  Snack: lunch meat   Second Meal: sauteed vegetables (asparagus, mushrooms) + coconut aminos Snack: parmesan crisps  Third Meal: meat + vegetables + starch Snack: none stated  Food & Nutrition Related Hx Dietary Hx: Pt states she is a "foodie." She and her husband are empty nesters and go out to eat often. Pt states she often watches food/nutrition-related documentaries and is into healthy eating. Pt likes [place] foods, and has tried to stick to a plant-based diet. Pt likes smoothies and carbohydrates, but has tried to limit carbs/sugar. Likes whole grains, quinoa, meat, fruits  and vegetables, and savory foods (for example buttered popcorn.) Pt states she has lactose intolerance, therefore avoids dairy foods as much as possible, drinking almond milk and eating cashew-based yogurt for examples.  Supplements: vitamin D3, iron GI / Other Notable Symptoms: none stated   Physical Activity  Current average weekly physical activity: has stationary bike + uses an app workout program (V-shred)   Estimated Energy Needs Calories: 1800 Carbohydrate: 200g Protein: 135g Fat: 50g    NUTRITION DIAGNOSIS  Overweight/obesity ([place]-3.3) related to increased psychological/life stress as evidenced by BMI more than normative standard for age/sex (31.58 kg/m2) and reports of stress and inadequate sleep.    NUTRITION INTERVENTION  Nutrition education (E-1) on the following topics:   General healthful eating: MyPlate, food groups, variety of foods, balanced eating  Energy: macronutrients, sources of each, balanced intake, importance/function of each, blood sugar & insulin hormone . Protein: animal/plant based sources, importance . Mindful eating: eating slowly, chewing thoroughly, eating when hungry, stopping when full . Sleep: tips for better sleep including avoiding large meals close to bedtime, avoiding screens/light, winding down by reading a book or meditating  Handouts Provided Include   MyPlate   Meal Ideas   Breakfast Ideas   Balanced Snacks   Plant Protein   Sleep Hygiene   Learning Style & Readiness for Change Teaching method utilized: Visual & Auditory  Demonstrated degree of understanding via: Teach Back  Barriers to learning/adherence to lifestyle change: None Identified  Goals Established by Pt . To get 30 to 45 minutes of exercise per day on 3 days per week.     MONITORING & EVALUATION Dietary intake, weekly physical activity, body weight, and sleep in 5 weeks.  RD's  Notes for Next Visit  . Tanita scale for weight (to demonstrate body  composition) . Goal setting/ ways to track (journal, apps, reminders, etc.)  . Sleep (bedtime snack options, ideas to "wind down," etc.)  . Stress (brain/gut relationship, emotional/intuitive eating, ask about probiotic) . Ask about beverage intake  . Ask about supplements/ herbals  Next Steps  Patient is to return to [facility] for follow up appointment in approximately 5 weeks. Patient may contact via phone or email in the meantime with any questions or concerns.

## 2018-05-12 DIAGNOSIS — M545 Low back pain: Secondary | ICD-10-CM | POA: Diagnosis not present

## 2018-05-12 DIAGNOSIS — G8929 Other chronic pain: Secondary | ICD-10-CM | POA: Diagnosis not present

## 2018-05-12 DIAGNOSIS — M6281 Muscle weakness (generalized): Secondary | ICD-10-CM | POA: Diagnosis not present

## 2018-05-12 DIAGNOSIS — R262 Difficulty in walking, not elsewhere classified: Secondary | ICD-10-CM | POA: Diagnosis not present

## 2018-05-15 DIAGNOSIS — R262 Difficulty in walking, not elsewhere classified: Secondary | ICD-10-CM | POA: Diagnosis not present

## 2018-05-15 DIAGNOSIS — M545 Low back pain: Secondary | ICD-10-CM | POA: Diagnosis not present

## 2018-05-15 DIAGNOSIS — G8929 Other chronic pain: Secondary | ICD-10-CM | POA: Diagnosis not present

## 2018-05-15 DIAGNOSIS — M6281 Muscle weakness (generalized): Secondary | ICD-10-CM | POA: Diagnosis not present

## 2018-05-28 DIAGNOSIS — M545 Low back pain: Secondary | ICD-10-CM | POA: Diagnosis not present

## 2018-05-28 DIAGNOSIS — M6281 Muscle weakness (generalized): Secondary | ICD-10-CM | POA: Diagnosis not present

## 2018-05-28 DIAGNOSIS — G8929 Other chronic pain: Secondary | ICD-10-CM | POA: Diagnosis not present

## 2018-05-28 DIAGNOSIS — R262 Difficulty in walking, not elsewhere classified: Secondary | ICD-10-CM | POA: Diagnosis not present

## 2018-06-05 ENCOUNTER — Ambulatory Visit: Payer: Self-pay | Admitting: Dietician

## 2018-06-07 DIAGNOSIS — M6281 Muscle weakness (generalized): Secondary | ICD-10-CM | POA: Diagnosis not present

## 2018-06-07 DIAGNOSIS — R262 Difficulty in walking, not elsewhere classified: Secondary | ICD-10-CM | POA: Diagnosis not present

## 2018-06-07 DIAGNOSIS — M545 Low back pain: Secondary | ICD-10-CM | POA: Diagnosis not present

## 2018-06-07 DIAGNOSIS — G8929 Other chronic pain: Secondary | ICD-10-CM | POA: Diagnosis not present

## 2018-06-07 MED FILL — DICLOFENAC SODIUM 75 MG TAB: 75 | 25 days supply | Qty: 50 | Fill #2

## 2018-06-19 MED FILL — TRIAMTERENE/HCTZ 37.5/25 TB: 37.5-25 | 90 days supply | Qty: 90 | Fill #0

## 2018-06-19 MED FILL — PROPRANOLOL HCL ER 80 MG CP: 80 | 90 days supply | Qty: 90 | Fill #0

## 2018-06-25 MED FILL — ALPRAZolam 0.5 MG TABS: 0.5 | 10 days supply | Qty: 30 | Fill #0

## 2018-07-03 DIAGNOSIS — G8929 Other chronic pain: Secondary | ICD-10-CM | POA: Diagnosis not present

## 2018-07-03 DIAGNOSIS — M6281 Muscle weakness (generalized): Secondary | ICD-10-CM | POA: Diagnosis not present

## 2018-07-03 DIAGNOSIS — R262 Difficulty in walking, not elsewhere classified: Secondary | ICD-10-CM | POA: Diagnosis not present

## 2018-07-03 DIAGNOSIS — M545 Low back pain: Secondary | ICD-10-CM | POA: Diagnosis not present

## 2018-07-10 DIAGNOSIS — R262 Difficulty in walking, not elsewhere classified: Secondary | ICD-10-CM | POA: Diagnosis not present

## 2018-07-10 DIAGNOSIS — G8929 Other chronic pain: Secondary | ICD-10-CM | POA: Diagnosis not present

## 2018-07-10 DIAGNOSIS — M6281 Muscle weakness (generalized): Secondary | ICD-10-CM | POA: Diagnosis not present

## 2018-07-10 DIAGNOSIS — M545 Low back pain: Secondary | ICD-10-CM | POA: Diagnosis not present

## 2018-07-19 DIAGNOSIS — R635 Abnormal weight gain: Secondary | ICD-10-CM | POA: Diagnosis not present

## 2018-07-19 DIAGNOSIS — R140 Abdominal distension (gaseous): Secondary | ICD-10-CM | POA: Diagnosis not present

## 2018-07-19 DIAGNOSIS — K5904 Chronic idiopathic constipation: Secondary | ICD-10-CM | POA: Diagnosis not present

## 2018-07-19 DIAGNOSIS — K219 Gastro-esophageal reflux disease without esophagitis: Secondary | ICD-10-CM | POA: Diagnosis not present

## 2018-07-24 DIAGNOSIS — M545 Low back pain: Secondary | ICD-10-CM | POA: Diagnosis not present

## 2018-07-24 DIAGNOSIS — R262 Difficulty in walking, not elsewhere classified: Secondary | ICD-10-CM | POA: Diagnosis not present

## 2018-07-24 DIAGNOSIS — M6281 Muscle weakness (generalized): Secondary | ICD-10-CM | POA: Diagnosis not present

## 2018-07-24 DIAGNOSIS — G8929 Other chronic pain: Secondary | ICD-10-CM | POA: Diagnosis not present

## 2018-08-08 DIAGNOSIS — M1711 Unilateral primary osteoarthritis, right knee: Secondary | ICD-10-CM | POA: Diagnosis not present

## 2018-08-14 DIAGNOSIS — E559 Vitamin D deficiency, unspecified: Secondary | ICD-10-CM | POA: Diagnosis not present

## 2018-08-14 DIAGNOSIS — Z780 Asymptomatic menopausal state: Secondary | ICD-10-CM | POA: Diagnosis not present

## 2018-08-14 DIAGNOSIS — M109 Gout, unspecified: Secondary | ICD-10-CM | POA: Diagnosis not present

## 2018-08-14 DIAGNOSIS — Z5181 Encounter for therapeutic drug level monitoring: Secondary | ICD-10-CM | POA: Diagnosis not present

## 2018-08-14 DIAGNOSIS — M4126 Other idiopathic scoliosis, lumbar region: Secondary | ICD-10-CM | POA: Diagnosis not present

## 2018-08-14 DIAGNOSIS — Z79899 Other long term (current) drug therapy: Secondary | ICD-10-CM | POA: Diagnosis not present

## 2018-08-14 DIAGNOSIS — M25561 Pain in right knee: Secondary | ICD-10-CM | POA: Diagnosis not present

## 2018-08-14 DIAGNOSIS — M461 Sacroiliitis, not elsewhere classified: Secondary | ICD-10-CM | POA: Diagnosis not present

## 2018-08-14 DIAGNOSIS — M79606 Pain in leg, unspecified: Secondary | ICD-10-CM | POA: Diagnosis not present

## 2018-08-14 DIAGNOSIS — I83899 Varicose veins of unspecified lower extremities with other complications: Secondary | ICD-10-CM | POA: Diagnosis not present

## 2018-08-14 DIAGNOSIS — E7800 Pure hypercholesterolemia, unspecified: Secondary | ICD-10-CM | POA: Diagnosis not present

## 2018-08-17 DIAGNOSIS — M1711 Unilateral primary osteoarthritis, right knee: Secondary | ICD-10-CM | POA: Diagnosis not present

## 2018-08-24 DIAGNOSIS — M1711 Unilateral primary osteoarthritis, right knee: Secondary | ICD-10-CM | POA: Diagnosis not present

## 2018-09-17 DIAGNOSIS — M9904 Segmental and somatic dysfunction of sacral region: Secondary | ICD-10-CM | POA: Diagnosis not present

## 2018-09-17 DIAGNOSIS — M609 Myositis, unspecified: Secondary | ICD-10-CM | POA: Diagnosis not present

## 2018-09-17 DIAGNOSIS — M62830 Muscle spasm of back: Secondary | ICD-10-CM | POA: Diagnosis not present

## 2018-09-17 DIAGNOSIS — M9902 Segmental and somatic dysfunction of thoracic region: Secondary | ICD-10-CM | POA: Diagnosis not present

## 2018-09-17 DIAGNOSIS — M545 Low back pain: Secondary | ICD-10-CM | POA: Diagnosis not present

## 2018-09-28 DIAGNOSIS — M1711 Unilateral primary osteoarthritis, right knee: Secondary | ICD-10-CM | POA: Diagnosis not present

## 2018-09-28 MED FILL — DICLOFENAC SODIUM 1 % GEL: 1 | 7 days supply | Qty: 100 | Fill #0

## 2018-10-16 MED FILL — TRIAMTERENE/HCTZ 37.5/25 TB: 37.5-25 | 90 days supply | Qty: 90 | Fill #1

## 2018-11-01 DIAGNOSIS — M9902 Segmental and somatic dysfunction of thoracic region: Secondary | ICD-10-CM | POA: Diagnosis not present

## 2018-11-01 DIAGNOSIS — M609 Myositis, unspecified: Secondary | ICD-10-CM | POA: Diagnosis not present

## 2018-11-01 DIAGNOSIS — M62830 Muscle spasm of back: Secondary | ICD-10-CM | POA: Diagnosis not present

## 2018-11-01 DIAGNOSIS — M9904 Segmental and somatic dysfunction of sacral region: Secondary | ICD-10-CM | POA: Diagnosis not present

## 2018-11-01 DIAGNOSIS — M545 Low back pain: Secondary | ICD-10-CM | POA: Diagnosis not present

## 2018-11-12 DIAGNOSIS — D500 Iron deficiency anemia secondary to blood loss (chronic): Secondary | ICD-10-CM | POA: Diagnosis not present

## 2018-11-12 DIAGNOSIS — Z5181 Encounter for therapeutic drug level monitoring: Secondary | ICD-10-CM | POA: Diagnosis not present

## 2018-11-12 DIAGNOSIS — E7800 Pure hypercholesterolemia, unspecified: Secondary | ICD-10-CM | POA: Diagnosis not present

## 2018-11-12 DIAGNOSIS — M25551 Pain in right hip: Secondary | ICD-10-CM | POA: Diagnosis not present

## 2018-11-12 DIAGNOSIS — I83899 Varicose veins of unspecified lower extremities with other complications: Secondary | ICD-10-CM | POA: Diagnosis not present

## 2018-11-12 DIAGNOSIS — I10 Essential (primary) hypertension: Secondary | ICD-10-CM | POA: Diagnosis not present

## 2018-11-12 DIAGNOSIS — E559 Vitamin D deficiency, unspecified: Secondary | ICD-10-CM | POA: Diagnosis not present

## 2018-11-12 MED FILL — OMEPRAZOLE 20 MG CAP: 20 | 90 days supply | Qty: 90 | Fill #0

## 2018-11-16 MED FILL — PROPRANOLOL HCL ER 80 MG CP: 80 | 90 days supply | Qty: 90 | Fill #0

## 2018-12-10 DIAGNOSIS — M25551 Pain in right hip: Secondary | ICD-10-CM | POA: Diagnosis not present

## 2018-12-10 DIAGNOSIS — D500 Iron deficiency anemia secondary to blood loss (chronic): Secondary | ICD-10-CM | POA: Diagnosis not present

## 2018-12-10 DIAGNOSIS — E559 Vitamin D deficiency, unspecified: Secondary | ICD-10-CM | POA: Diagnosis not present

## 2018-12-10 DIAGNOSIS — E7800 Pure hypercholesterolemia, unspecified: Secondary | ICD-10-CM | POA: Diagnosis not present

## 2018-12-10 DIAGNOSIS — I83899 Varicose veins of unspecified lower extremities with other complications: Secondary | ICD-10-CM | POA: Diagnosis not present

## 2018-12-10 DIAGNOSIS — I10 Essential (primary) hypertension: Secondary | ICD-10-CM | POA: Diagnosis not present

## 2018-12-10 DIAGNOSIS — Z5181 Encounter for therapeutic drug level monitoring: Secondary | ICD-10-CM | POA: Diagnosis not present

## 2018-12-24 DIAGNOSIS — M25551 Pain in right hip: Secondary | ICD-10-CM | POA: Diagnosis not present

## 2018-12-24 DIAGNOSIS — M545 Low back pain: Secondary | ICD-10-CM | POA: Diagnosis not present

## 2018-12-24 MED FILL — traMADol HCL 50 MG TABS: 50 | 5 days supply | Qty: 30 | Fill #0

## 2018-12-24 MED FILL — predniSONE 10 MG (21) TBPK: 10 | 6 days supply | Qty: 21 | Fill #0

## 2019-01-10 MED FILL — AZELASTINE HCL 0.05 % SOLN: 0.05 | 30 days supply | Qty: 6 | Fill #0

## 2019-01-21 MED FILL — TRIAMTERENE-HCTZ 37.5-25 MG: 37.5-25 | 90 days supply | Qty: 90 | Fill #0

## 2019-01-23 DIAGNOSIS — M9904 Segmental and somatic dysfunction of sacral region: Secondary | ICD-10-CM | POA: Diagnosis not present

## 2019-01-23 DIAGNOSIS — M62830 Muscle spasm of back: Secondary | ICD-10-CM | POA: Diagnosis not present

## 2019-01-23 DIAGNOSIS — M545 Low back pain: Secondary | ICD-10-CM | POA: Diagnosis not present

## 2019-01-23 DIAGNOSIS — M9902 Segmental and somatic dysfunction of thoracic region: Secondary | ICD-10-CM | POA: Diagnosis not present

## 2019-01-23 DIAGNOSIS — M609 Myositis, unspecified: Secondary | ICD-10-CM | POA: Diagnosis not present

## 2019-01-28 DIAGNOSIS — M609 Myositis, unspecified: Secondary | ICD-10-CM | POA: Diagnosis not present

## 2019-01-28 DIAGNOSIS — M62830 Muscle spasm of back: Secondary | ICD-10-CM | POA: Diagnosis not present

## 2019-01-28 DIAGNOSIS — M545 Low back pain: Secondary | ICD-10-CM | POA: Diagnosis not present

## 2019-01-28 DIAGNOSIS — M9902 Segmental and somatic dysfunction of thoracic region: Secondary | ICD-10-CM | POA: Diagnosis not present

## 2019-01-28 DIAGNOSIS — M9904 Segmental and somatic dysfunction of sacral region: Secondary | ICD-10-CM | POA: Diagnosis not present

## 2019-01-29 ENCOUNTER — Other Ambulatory Visit: Payer: Self-pay | Admitting: Obstetrics and Gynecology

## 2019-01-29 DIAGNOSIS — Z1231 Encounter for screening mammogram for malignant neoplasm of breast: Secondary | ICD-10-CM

## 2019-02-04 DIAGNOSIS — M9902 Segmental and somatic dysfunction of thoracic region: Secondary | ICD-10-CM | POA: Diagnosis not present

## 2019-02-04 DIAGNOSIS — M545 Low back pain: Secondary | ICD-10-CM | POA: Diagnosis not present

## 2019-02-04 DIAGNOSIS — M9904 Segmental and somatic dysfunction of sacral region: Secondary | ICD-10-CM | POA: Diagnosis not present

## 2019-02-04 DIAGNOSIS — M609 Myositis, unspecified: Secondary | ICD-10-CM | POA: Diagnosis not present

## 2019-02-04 DIAGNOSIS — M62830 Muscle spasm of back: Secondary | ICD-10-CM | POA: Diagnosis not present

## 2019-02-06 DIAGNOSIS — M609 Myositis, unspecified: Secondary | ICD-10-CM | POA: Diagnosis not present

## 2019-02-06 DIAGNOSIS — M9904 Segmental and somatic dysfunction of sacral region: Secondary | ICD-10-CM | POA: Diagnosis not present

## 2019-02-06 DIAGNOSIS — M9902 Segmental and somatic dysfunction of thoracic region: Secondary | ICD-10-CM | POA: Diagnosis not present

## 2019-02-06 DIAGNOSIS — M62830 Muscle spasm of back: Secondary | ICD-10-CM | POA: Diagnosis not present

## 2019-02-06 DIAGNOSIS — M545 Low back pain: Secondary | ICD-10-CM | POA: Diagnosis not present

## 2019-02-11 DIAGNOSIS — M609 Myositis, unspecified: Secondary | ICD-10-CM | POA: Diagnosis not present

## 2019-02-11 DIAGNOSIS — M545 Low back pain: Secondary | ICD-10-CM | POA: Diagnosis not present

## 2019-02-11 DIAGNOSIS — M9902 Segmental and somatic dysfunction of thoracic region: Secondary | ICD-10-CM | POA: Diagnosis not present

## 2019-02-11 DIAGNOSIS — M62830 Muscle spasm of back: Secondary | ICD-10-CM | POA: Diagnosis not present

## 2019-02-11 DIAGNOSIS — M9904 Segmental and somatic dysfunction of sacral region: Secondary | ICD-10-CM | POA: Diagnosis not present

## 2019-02-11 DIAGNOSIS — Z1151 Encounter for screening for human papillomavirus (HPV): Secondary | ICD-10-CM | POA: Diagnosis not present

## 2019-02-11 DIAGNOSIS — Z6833 Body mass index (BMI) 33.0-33.9, adult: Secondary | ICD-10-CM | POA: Diagnosis not present

## 2019-02-11 DIAGNOSIS — Z01419 Encounter for gynecological examination (general) (routine) without abnormal findings: Secondary | ICD-10-CM | POA: Diagnosis not present

## 2019-02-11 MED FILL — SERTRALINE HCL 50 MG TABLET: 50 | 30 days supply | Qty: 30 | Fill #0

## 2019-02-19 DIAGNOSIS — E782 Mixed hyperlipidemia: Secondary | ICD-10-CM | POA: Diagnosis not present

## 2019-02-19 DIAGNOSIS — G479 Sleep disorder, unspecified: Secondary | ICD-10-CM | POA: Diagnosis not present

## 2019-02-19 DIAGNOSIS — R7301 Impaired fasting glucose: Secondary | ICD-10-CM | POA: Diagnosis not present

## 2019-02-19 DIAGNOSIS — E559 Vitamin D deficiency, unspecified: Secondary | ICD-10-CM | POA: Diagnosis not present

## 2019-02-19 DIAGNOSIS — R4586 Emotional lability: Secondary | ICD-10-CM | POA: Diagnosis not present

## 2019-02-19 DIAGNOSIS — N951 Menopausal and female climacteric states: Secondary | ICD-10-CM | POA: Diagnosis not present

## 2019-02-19 DIAGNOSIS — R635 Abnormal weight gain: Secondary | ICD-10-CM | POA: Diagnosis not present

## 2019-02-22 DIAGNOSIS — Z6832 Body mass index (BMI) 32.0-32.9, adult: Secondary | ICD-10-CM | POA: Diagnosis not present

## 2019-02-22 DIAGNOSIS — I10 Essential (primary) hypertension: Secondary | ICD-10-CM | POA: Diagnosis not present

## 2019-02-22 DIAGNOSIS — E559 Vitamin D deficiency, unspecified: Secondary | ICD-10-CM | POA: Diagnosis not present

## 2019-02-22 DIAGNOSIS — R7303 Prediabetes: Secondary | ICD-10-CM | POA: Diagnosis not present

## 2019-02-22 DIAGNOSIS — M2550 Pain in unspecified joint: Secondary | ICD-10-CM | POA: Diagnosis not present

## 2019-02-22 DIAGNOSIS — K219 Gastro-esophageal reflux disease without esophagitis: Secondary | ICD-10-CM | POA: Diagnosis not present

## 2019-02-22 DIAGNOSIS — R635 Abnormal weight gain: Secondary | ICD-10-CM | POA: Diagnosis not present

## 2019-02-22 DIAGNOSIS — Z1331 Encounter for screening for depression: Secondary | ICD-10-CM | POA: Diagnosis not present

## 2019-02-22 DIAGNOSIS — Z1339 Encounter for screening examination for other mental health and behavioral disorders: Secondary | ICD-10-CM | POA: Diagnosis not present

## 2019-02-26 MED FILL — PROPRANOLOL HCL ER 80 MG CP: 80 | 90 days supply | Qty: 90 | Fill #1

## 2019-03-01 DIAGNOSIS — Z6833 Body mass index (BMI) 33.0-33.9, adult: Secondary | ICD-10-CM | POA: Diagnosis not present

## 2019-03-01 DIAGNOSIS — E559 Vitamin D deficiency, unspecified: Secondary | ICD-10-CM | POA: Diagnosis not present

## 2019-03-05 ENCOUNTER — Ambulatory Visit: Payer: 59 | Attending: Internal Medicine

## 2019-03-05 DIAGNOSIS — Z20822 Contact with and (suspected) exposure to covid-19: Secondary | ICD-10-CM

## 2019-03-05 DIAGNOSIS — Z20828 Contact with and (suspected) exposure to other viral communicable diseases: Secondary | ICD-10-CM | POA: Diagnosis not present

## 2019-03-07 LAB — NOVEL CORONAVIRUS, NAA: SARS-CoV-2, NAA: NOT DETECTED

## 2019-03-11 DIAGNOSIS — Z6832 Body mass index (BMI) 32.0-32.9, adult: Secondary | ICD-10-CM | POA: Diagnosis not present

## 2019-03-11 DIAGNOSIS — R7303 Prediabetes: Secondary | ICD-10-CM | POA: Diagnosis not present

## 2019-03-26 ENCOUNTER — Other Ambulatory Visit: Payer: Self-pay

## 2019-03-26 ENCOUNTER — Ambulatory Visit
Admission: RE | Admit: 2019-03-26 | Discharge: 2019-03-26 | Disposition: A | Discharge status: Home / Self Care | Payer: 59 | Source: Ambulatory Visit | Attending: Obstetrics and Gynecology | Admitting: Obstetrics and Gynecology

## 2019-03-26 DIAGNOSIS — Z1231 Encounter for screening mammogram for malignant neoplasm of breast: Secondary | ICD-10-CM

## 2019-04-09 MED FILL — OMEPRAZOLE 20 MG CAP: 20 | 90 days supply | Qty: 90 | Fill #1

## 2019-04-26 DIAGNOSIS — H524 Presbyopia: Secondary | ICD-10-CM | POA: Diagnosis not present

## 2019-05-01 MED FILL — TRIAMTERENE-HCTZ 37.5-25 MG: 37.5-25 | 90 days supply | Qty: 90 | Fill #0

## 2019-05-03 DIAGNOSIS — Z780 Asymptomatic menopausal state: Secondary | ICD-10-CM | POA: Diagnosis not present

## 2019-05-03 DIAGNOSIS — E7800 Pure hypercholesterolemia, unspecified: Secondary | ICD-10-CM | POA: Diagnosis not present

## 2019-05-03 DIAGNOSIS — E559 Vitamin D deficiency, unspecified: Secondary | ICD-10-CM | POA: Diagnosis not present

## 2019-05-03 DIAGNOSIS — D500 Iron deficiency anemia secondary to blood loss (chronic): Secondary | ICD-10-CM | POA: Diagnosis not present

## 2019-05-03 DIAGNOSIS — I10 Essential (primary) hypertension: Secondary | ICD-10-CM | POA: Diagnosis not present

## 2019-05-03 DIAGNOSIS — M109 Gout, unspecified: Secondary | ICD-10-CM | POA: Diagnosis not present

## 2019-05-03 DIAGNOSIS — R739 Hyperglycemia, unspecified: Secondary | ICD-10-CM | POA: Diagnosis not present

## 2019-05-06 DIAGNOSIS — I10 Essential (primary) hypertension: Secondary | ICD-10-CM | POA: Diagnosis not present

## 2019-05-06 DIAGNOSIS — H6123 Impacted cerumen, bilateral: Secondary | ICD-10-CM | POA: Diagnosis not present

## 2019-05-06 DIAGNOSIS — Z0000 Encounter for general adult medical examination without abnormal findings: Secondary | ICD-10-CM | POA: Diagnosis not present

## 2019-05-06 DIAGNOSIS — Z6832 Body mass index (BMI) 32.0-32.9, adult: Secondary | ICD-10-CM | POA: Diagnosis not present

## 2019-05-09 MED FILL — PROPRANOLOL HCL ER 80 MG CP: 80 | 90 days supply | Qty: 90 | Fill #0

## 2019-06-24 DIAGNOSIS — M7130 Other bursal cyst, unspecified site: Secondary | ICD-10-CM | POA: Diagnosis not present

## 2019-06-24 DIAGNOSIS — B078 Other viral warts: Secondary | ICD-10-CM | POA: Diagnosis not present

## 2019-06-24 DIAGNOSIS — D225 Melanocytic nevi of trunk: Secondary | ICD-10-CM | POA: Diagnosis not present

## 2019-06-28 DIAGNOSIS — M1711 Unilateral primary osteoarthritis, right knee: Secondary | ICD-10-CM | POA: Diagnosis not present

## 2019-08-14 MED FILL — TRIAMTERENE-HCTZ 37.5-25 MG: 37.5-25 | 90 days supply | Qty: 90 | Fill #0

## 2019-09-13 MED FILL — PROPRANOLOL HCL ER 80 MG CP: 80 | 90 days supply | Qty: 90 | Fill #1

## 2019-09-18 DIAGNOSIS — M1711 Unilateral primary osteoarthritis, right knee: Secondary | ICD-10-CM | POA: Diagnosis not present

## 2019-09-23 ENCOUNTER — Other Ambulatory Visit: Payer: Self-pay | Admitting: Podiatry

## 2019-09-27 DIAGNOSIS — M1711 Unilateral primary osteoarthritis, right knee: Secondary | ICD-10-CM | POA: Diagnosis not present

## 2019-09-27 MED FILL — DICLOFENAC SODIUM 75 MG TAB: 75 | 30 days supply | Qty: 60 | Fill #0

## 2019-10-11 DIAGNOSIS — M1711 Unilateral primary osteoarthritis, right knee: Secondary | ICD-10-CM | POA: Diagnosis not present

## 2019-11-11 MED FILL — TRIAMTERENE-HCTZ 37.5-25 MG: 37.5-25 | 90 days supply | Qty: 90 | Fill #0

## 2019-11-27 MED FILL — PROPRANOLOL HCL ER 80 MG CP: 80 | 90 days supply | Qty: 90 | Fill #0

## 2019-12-02 DIAGNOSIS — M1711 Unilateral primary osteoarthritis, right knee: Secondary | ICD-10-CM | POA: Diagnosis not present

## 2019-12-09 ENCOUNTER — Other Ambulatory Visit (HOSPITAL_COMMUNITY): Payer: Self-pay | Admitting: Family Medicine

## 2019-12-09 DIAGNOSIS — M25561 Pain in right knee: Secondary | ICD-10-CM | POA: Diagnosis not present

## 2019-12-09 DIAGNOSIS — R739 Hyperglycemia, unspecified: Secondary | ICD-10-CM | POA: Diagnosis not present

## 2019-12-09 DIAGNOSIS — E7800 Pure hypercholesterolemia, unspecified: Secondary | ICD-10-CM | POA: Diagnosis not present

## 2019-12-09 DIAGNOSIS — E559 Vitamin D deficiency, unspecified: Secondary | ICD-10-CM | POA: Diagnosis not present

## 2019-12-09 DIAGNOSIS — H9190 Unspecified hearing loss, unspecified ear: Secondary | ICD-10-CM | POA: Diagnosis not present

## 2019-12-09 DIAGNOSIS — Z780 Asymptomatic menopausal state: Secondary | ICD-10-CM | POA: Diagnosis not present

## 2019-12-09 DIAGNOSIS — M109 Gout, unspecified: Secondary | ICD-10-CM | POA: Diagnosis not present

## 2019-12-09 DIAGNOSIS — I10 Essential (primary) hypertension: Secondary | ICD-10-CM | POA: Diagnosis not present

## 2019-12-09 DIAGNOSIS — D500 Iron deficiency anemia secondary to blood loss (chronic): Secondary | ICD-10-CM | POA: Diagnosis not present

## 2019-12-09 MED FILL — ALPRAZolam 0.5 MG TABS: 0.5 | 30 days supply | Qty: 60 | Fill #0

## 2019-12-09 MED FILL — OMEPRAZOLE 20 MG CAP: 20 | 90 days supply | Qty: 90 | Fill #0

## 2019-12-09 MED FILL — DICLOFENAC SODIUM 75 MG TAB: 75 | 90 days supply | Qty: 180 | Fill #0

## 2020-02-08 ENCOUNTER — Other Ambulatory Visit (HOSPITAL_COMMUNITY): Payer: Self-pay

## 2020-02-08 MED FILL — BENZONATATE 100 MG CAPS: 100 | 7 days supply | Qty: 40 | Fill #0

## 2020-02-08 MED FILL — FLUTICASONE PROP 50 MCG SPR: 50 | 30 days supply | Qty: 16 | Fill #0

## 2020-02-10 ENCOUNTER — Other Ambulatory Visit (HOSPITAL_COMMUNITY): Payer: Self-pay | Admitting: Family Medicine

## 2020-02-10 DIAGNOSIS — R059 Cough, unspecified: Secondary | ICD-10-CM | POA: Diagnosis not present

## 2020-02-10 DIAGNOSIS — R509 Fever, unspecified: Secondary | ICD-10-CM | POA: Diagnosis not present

## 2020-02-10 DIAGNOSIS — J0100 Acute maxillary sinusitis, unspecified: Secondary | ICD-10-CM | POA: Diagnosis not present

## 2020-02-10 MED FILL — GUAIATUSSIN AC LIQUID: 100-10 | 5 days supply | Qty: 200 | Fill #0

## 2020-02-10 MED FILL — AMOXICILLIN 875 MG TABS: 875 | 10 days supply | Qty: 20 | Fill #0

## 2020-02-11 DIAGNOSIS — J0100 Acute maxillary sinusitis, unspecified: Secondary | ICD-10-CM | POA: Diagnosis not present

## 2020-02-11 DIAGNOSIS — Z20822 Contact with and (suspected) exposure to covid-19: Secondary | ICD-10-CM | POA: Diagnosis not present

## 2020-02-12 ENCOUNTER — Other Ambulatory Visit: Payer: Self-pay | Admitting: Obstetrics and Gynecology

## 2020-02-12 DIAGNOSIS — Z1231 Encounter for screening mammogram for malignant neoplasm of breast: Secondary | ICD-10-CM

## 2020-02-13 DIAGNOSIS — I10 Essential (primary) hypertension: Secondary | ICD-10-CM | POA: Diagnosis not present

## 2020-02-13 DIAGNOSIS — Z01419 Encounter for gynecological examination (general) (routine) without abnormal findings: Secondary | ICD-10-CM | POA: Diagnosis not present

## 2020-02-13 DIAGNOSIS — N951 Menopausal and female climacteric states: Secondary | ICD-10-CM | POA: Diagnosis not present

## 2020-02-13 DIAGNOSIS — Z124 Encounter for screening for malignant neoplasm of cervix: Secondary | ICD-10-CM | POA: Diagnosis not present

## 2020-02-13 DIAGNOSIS — N952 Postmenopausal atrophic vaginitis: Secondary | ICD-10-CM | POA: Diagnosis not present

## 2020-02-26 ENCOUNTER — Ambulatory Visit (INDEPENDENT_AMBULATORY_CARE_PROVIDER_SITE_OTHER): Payer: 59 | Admitting: Otolaryngology

## 2020-03-02 MED FILL — TRIAMTERENE-HCTZ 37.5-25 MG: 37.5-25 | 90 days supply | Qty: 90 | Fill #0

## 2020-03-04 ENCOUNTER — Other Ambulatory Visit: Payer: Self-pay

## 2020-03-04 ENCOUNTER — Encounter (INDEPENDENT_AMBULATORY_CARE_PROVIDER_SITE_OTHER): Payer: Self-pay | Admitting: Otolaryngology

## 2020-03-04 ENCOUNTER — Ambulatory Visit (INDEPENDENT_AMBULATORY_CARE_PROVIDER_SITE_OTHER): Payer: 59 | Admitting: Otolaryngology

## 2020-03-04 VITALS — Temp 97.3°F

## 2020-03-04 DIAGNOSIS — H6983 Other specified disorders of Eustachian tube, bilateral: Secondary | ICD-10-CM

## 2020-03-04 DIAGNOSIS — J310 Chronic rhinitis: Secondary | ICD-10-CM

## 2020-03-04 NOTE — Progress Notes (Signed)
HPI: Kimberly Boyd is a 54 y.o. female who returns today for evaluation of of her ears.  He has had problems with equalizing pressure when she flies in the past.  She has had problems with ear pain as well as decreased hearing.  She is getting ready to fly to [place] for her daughter who is starting a new job and she wanted her his ears checked prior to the flight..  Past Medical History:  Diagnosis Date  . Arthritis   . Back pain years.    chronic low back pain   . Complication of anesthesia    tachycardia, difficulty waking up  . GERD (gastroesophageal reflux disease)   . Heart murmur    questionable  . Hypertension   . Obesity   . Palpitations   . Palpitations 2011   takes  propanolol    Past Surgical History:  Procedure Laterality Date  . BREAST BIOPSY Right    Benign approx age 80  . cervical dysplasia laser    . dermoid cyst removal    . IR GENERIC HISTORICAL  06/04/2014   IR RADIOLOGIST EVAL & MGMT 06/04/2014 Sandi Mariscal, MD [facility] INTERV RAD  . LAPAROSCOPIC CHOLECYSTECTOMY    . LIPOMA RESECTION     and redone  . ORIF ANKLE FRACTURE  08/09/2011   Procedure: OPEN REDUCTION INTERNAL FIXATION (ORIF) ANKLE FRACTURE;  Surgeon: Wylene Simmer, MD;  Location: [place];  Service: Orthopedics;  Laterality: Right;  ORIF Right Ankle Lateral Malleolus  . scar revision for the lipoma     Social History   Socioeconomic History  . Marital status: Married    Spouse name: Not on file  . Number of children: Not on file  . Years of education: Not on file  . Highest education level: Not on file  Occupational History  . Not on file  Tobacco Use  . Smoking status: Never Smoker  . Smokeless tobacco: Never Used  Vaping Use  . Vaping Use: Never used  Substance and Sexual Activity  . Alcohol use: Yes    Comment: rare wine  . Drug use: No  . Sexual activity: Yes    Partners: Male  Other Topics Concern  . Not on file  Social History Narrative   Married with 2 children. 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    Social Determinants of Health   Financial Resource Strain: Not on file  Food Insecurity: Not on file  Transportation Needs: Not on file  Physical Activity: Not on file  Stress: Not on file  Social Connections: Not on file   No family history on file. Allergies  Allergen Reactions  . Doxycycline Other (See Comments)    Esophagitis; opened up in my esophagus   Prior to Admission medications   Medication Sig Start Date End Date Taking? Authorizing Provider  ALPRAZolam Duanne Moron) 0.5 MG tablet Take 0.5 mg by mouth as needed for anxiety.    [provider]  dexlansoprazole (DEXILANT) 60 MG capsule Take 60 mg by mouth every morning.     [provider]  diclofenac (VOLTAREN) 75 MG EC tablet Take 1 tablet (75 mg total) by mouth 2 (two) times daily. 09/29/17   Wallene Huh, DPM  docusate sodium (COLACE) 100 MG capsule Take 1 capsule (100 mg total) by mouth 2 (two) times daily. Patient not taking: Reported on 02/05/2018 11/27/13   Hedy Jacob, PA-C  Fe Fum-FePoly-FA-Vit C-Vit B3 (INTEGRA  F PO) Take 1 tablet by mouth every morning.     [provider]  ibuprofen (ADVIL) 200 MG tablet Take 3 tablets (600 mg total) by mouth every 6 (six) hours as needed. Take every 6 hrs for first 48 hours then as needed for pain 11/27/13   Hedy Jacob, PA-C  meloxicam (MOBIC) 15 MG tablet Take 15 mg by mouth as needed for pain.    [provider]  omeprazole (PRILOSEC) 40 MG capsule  07/24/17   [provider]  oxyCODONE (OXY IR/ROXICODONE) 5 MG immediate release tablet Take 1 tablet (5 mg total) by mouth every 4 (four) hours as needed for severe pain. Patient not taking: Reported on 12/06/2017 11/27/13   Tsosie Billing D, PA-C  predniSONE (DELTASONE) 10 MG tablet 12 day tapering dose Patient not taking: Reported on 12/06/2017 08/03/17   Wallene Huh, DPM  Probiotic Product (ALIGN PO) Take 1 tablet  by mouth daily as needed. For digestion    [provider]  promethazine (PHENERGAN) 12.5 MG tablet Take 1 tablet (12.5 mg total) by mouth every 6 (six) hours as needed for nausea. Patient not taking: Reported on 12/06/2017 11/27/13   Tsosie Billing D, PA-C  propranolol ER (INDERAL LA) 80 MG 24 hr capsule Take 80 mg by mouth every morning.    [provider]  triamterene-hydrochlorothiazide (MAXZIDE-25) 37.5-25 MG per tablet Take 0.5 tablets by mouth every morning.     [provider]     Positive ROS: Otherwise negative  All other systems have been reviewed and were otherwise negative with the exception of those mentioned in the HPI and as above.  Physical Exam: Constitutional: Alert, well-appearing, no acute distress Ears: External ears without lesions or tenderness.  She had minimal wax buildup in both ear canals that was cleaned with a curette.  Otherwise the TMs were clear bilaterally with good mobility pneumatic otoscopy. Nasal: External nose without lesions. Septum midline with mild rhinitis.  Both middle meatus regions were clear with no signs of infection.. Oral: Lips and gums without lesions. Tongue and palate mucosa without lesions. Posterior oropharynx clear. Neck: No palpable adenopathy or masses Respiratory: Breathing comfortably  Skin: No facial/neck lesions or rash noted.  Procedures  Assessment: Chronic rhinitis with history of eustachian tube dysfunction.  Plan: Discussed with her today that her ears are clear bilaterally.  Recommended use of Flonase which she is used in the past and she already has.  2 sprays each nostril at night. Also discussed with her concerning use of Sudafed prior to the flight as well as Afrin during the flight.  Also recommended chewing gum and blowing her nose on descent as this will help equalize pressure in the middle ear. She will follow-up as needed.   Radene Journey, MD

## 2020-03-16 MED FILL — PROPRANOLOL HCL ER 80 MG CP: 80 | 90 days supply | Qty: 90 | Fill #0

## 2020-03-31 ENCOUNTER — Ambulatory Visit: Payer: 59

## 2020-04-09 DIAGNOSIS — M1711 Unilateral primary osteoarthritis, right knee: Secondary | ICD-10-CM | POA: Diagnosis not present

## 2020-04-13 DIAGNOSIS — S93602D Unspecified sprain of left foot, subsequent encounter: Secondary | ICD-10-CM | POA: Diagnosis not present

## 2020-04-29 ENCOUNTER — Ambulatory Visit
Admission: RE | Admit: 2020-04-29 | Discharge: 2020-04-29 | Disposition: A | Discharge status: Home / Self Care | Payer: 59 | Source: Ambulatory Visit | Attending: Obstetrics and Gynecology | Admitting: Obstetrics and Gynecology

## 2020-04-29 ENCOUNTER — Other Ambulatory Visit: Payer: Self-pay

## 2020-04-29 DIAGNOSIS — Z1231 Encounter for screening mammogram for malignant neoplasm of breast: Secondary | ICD-10-CM | POA: Diagnosis not present

## 2020-05-06 DIAGNOSIS — M1711 Unilateral primary osteoarthritis, right knee: Secondary | ICD-10-CM | POA: Diagnosis not present

## 2020-05-12 ENCOUNTER — Ambulatory Visit (INDEPENDENT_AMBULATORY_CARE_PROVIDER_SITE_OTHER): Payer: 59 | Admitting: Bariatrics

## 2020-05-12 ENCOUNTER — Encounter (INDEPENDENT_AMBULATORY_CARE_PROVIDER_SITE_OTHER): Payer: Self-pay | Admitting: Bariatrics

## 2020-05-12 ENCOUNTER — Other Ambulatory Visit: Payer: Self-pay

## 2020-05-12 VITALS — BP 124/81 | HR 69 | Temp 98.3°F | Ht 67.0 in | Wt 213.0 lb

## 2020-05-12 DIAGNOSIS — E6609 Other obesity due to excess calories: Secondary | ICD-10-CM

## 2020-05-12 DIAGNOSIS — K219 Gastro-esophageal reflux disease without esophagitis: Secondary | ICD-10-CM | POA: Diagnosis not present

## 2020-05-12 DIAGNOSIS — K5909 Other constipation: Secondary | ICD-10-CM | POA: Diagnosis not present

## 2020-05-12 DIAGNOSIS — M545 Low back pain, unspecified: Secondary | ICD-10-CM | POA: Diagnosis not present

## 2020-05-12 DIAGNOSIS — E559 Vitamin D deficiency, unspecified: Secondary | ICD-10-CM

## 2020-05-12 DIAGNOSIS — R0602 Shortness of breath: Secondary | ICD-10-CM | POA: Diagnosis not present

## 2020-05-12 DIAGNOSIS — Z6833 Body mass index (BMI) 33.0-33.9, adult: Secondary | ICD-10-CM

## 2020-05-12 DIAGNOSIS — R5383 Other fatigue: Secondary | ICD-10-CM | POA: Diagnosis not present

## 2020-05-12 DIAGNOSIS — G8929 Other chronic pain: Secondary | ICD-10-CM

## 2020-05-12 DIAGNOSIS — Z0289 Encounter for other administrative examinations: Secondary | ICD-10-CM

## 2020-05-12 DIAGNOSIS — Z1331 Encounter for screening for depression: Secondary | ICD-10-CM | POA: Diagnosis not present

## 2020-05-12 DIAGNOSIS — M1711 Unilateral primary osteoarthritis, right knee: Secondary | ICD-10-CM | POA: Diagnosis not present

## 2020-05-12 DIAGNOSIS — I10 Essential (primary) hypertension: Secondary | ICD-10-CM | POA: Diagnosis not present

## 2020-05-12 DIAGNOSIS — Z9189 Other specified personal risk factors, not elsewhere classified: Secondary | ICD-10-CM

## 2020-05-12 DIAGNOSIS — R7303 Prediabetes: Secondary | ICD-10-CM | POA: Diagnosis not present

## 2020-05-12 DIAGNOSIS — E66811 Obesity, class 1: Secondary | ICD-10-CM

## 2020-05-13 ENCOUNTER — Encounter (INDEPENDENT_AMBULATORY_CARE_PROVIDER_SITE_OTHER): Payer: Self-pay | Admitting: Bariatrics

## 2020-05-13 DIAGNOSIS — Z780 Asymptomatic menopausal state: Secondary | ICD-10-CM | POA: Insufficient documentation

## 2020-05-13 DIAGNOSIS — R232 Flushing: Secondary | ICD-10-CM | POA: Insufficient documentation

## 2020-05-13 DIAGNOSIS — I8393 Asymptomatic varicose veins of bilateral lower extremities: Secondary | ICD-10-CM | POA: Insufficient documentation

## 2020-05-13 DIAGNOSIS — E7800 Pure hypercholesterolemia, unspecified: Secondary | ICD-10-CM | POA: Insufficient documentation

## 2020-05-13 DIAGNOSIS — G4700 Insomnia, unspecified: Secondary | ICD-10-CM | POA: Insufficient documentation

## 2020-05-13 DIAGNOSIS — M25561 Pain in right knee: Secondary | ICD-10-CM | POA: Insufficient documentation

## 2020-05-13 DIAGNOSIS — R7303 Prediabetes: Secondary | ICD-10-CM | POA: Insufficient documentation

## 2020-05-13 DIAGNOSIS — R739 Hyperglycemia, unspecified: Secondary | ICD-10-CM | POA: Insufficient documentation

## 2020-05-13 DIAGNOSIS — M1711 Unilateral primary osteoarthritis, right knee: Secondary | ICD-10-CM | POA: Diagnosis not present

## 2020-05-13 DIAGNOSIS — D500 Iron deficiency anemia secondary to blood loss (chronic): Secondary | ICD-10-CM | POA: Insufficient documentation

## 2020-05-13 DIAGNOSIS — F5089 Other specified eating disorder: Secondary | ICD-10-CM | POA: Insufficient documentation

## 2020-05-13 DIAGNOSIS — E559 Vitamin D deficiency, unspecified: Secondary | ICD-10-CM | POA: Insufficient documentation

## 2020-05-13 DIAGNOSIS — M109 Gout, unspecified: Secondary | ICD-10-CM | POA: Insufficient documentation

## 2020-05-13 DIAGNOSIS — M461 Sacroiliitis, not elsewhere classified: Secondary | ICD-10-CM | POA: Insufficient documentation

## 2020-05-13 DIAGNOSIS — D239 Other benign neoplasm of skin, unspecified: Secondary | ICD-10-CM | POA: Insufficient documentation

## 2020-05-13 DIAGNOSIS — R700 Elevated erythrocyte sedimentation rate: Secondary | ICD-10-CM | POA: Insufficient documentation

## 2020-05-13 LAB — VITAMIN D 25 HYDROXY (VIT D DEFICIENCY, FRACTURES): Vit D, 25-Hydroxy: 40.8 ng/mL (ref 30.0–100.0)

## 2020-05-13 LAB — INSULIN, RANDOM: INSULIN: 12.4 u[IU]/mL (ref 2.6–24.9)

## 2020-05-13 LAB — HEMOGLOBIN A1C
Est. average glucose Bld gHb Est-mCnc: 131 mg/dL
Hgb A1c MFr Bld: 6.2 % — ABNORMAL HIGH (ref 4.8–5.6)

## 2020-05-13 NOTE — Progress Notes (Signed)
Chief Complaint:   OBESITY Kimberly Boyd (MR# [id number]) is a 55 y.o. female who presents for evaluation and treatment of obesity and related comorbidities. Current BMI is Body mass index is 33.36 kg/m. Kimberly Boyd has been struggling with her weight for many years and has been unsuccessful in either losing weight, maintaining weight loss, or reaching her healthy weight goal.  Kimberly Boyd states that she is lactose intolerant. She sometimes likes to cook. She states that she "eats heavy" in the evening.  Kimberly Boyd is currently in the action stage of change and ready to dedicate time achieving and maintaining a healthier weight. Kimberly Boyd is interested in becoming our patient and working on intensive lifestyle modifications including (but not limited to) diet and exercise for weight loss.  Kimberly Boyd's habits were reviewed today and are as follows: Her family eats meals together, she thinks her family will eat healthier with her, her desired weight loss is 33 lbs, she started gaining weight 2-3 years ago, her heaviest weight ever was 214 pounds, she has significant food cravings issues, she skips meals frequently, she is frequently drinking liquids with calories, she frequently makes poor food choices, she has problems with excessive hunger, she frequently eats larger portions than normal and she struggles with emotional eating.  Depression Screen Kimberly Boyd's Food and Mood (modified PHQ-9) score was 11.  Depression screen PHQ 2/9 05/12/2020  Decreased Interest 2  Down, Depressed, Hopeless 1  PHQ - 2 Score 3  Altered sleeping 2  Tired, decreased energy 2  Change in appetite 2  Feeling bad or failure about yourself  1  Trouble concentrating 1  Moving slowly or fidgety/restless 0  Suicidal thoughts 0  PHQ-9 Score 11  Difficult doing work/chores Not difficult at all   Subjective:   1. Other fatigue Kimberly Boyd admits to daytime somnolence and admits to waking up still tired. Patent has a history of symptoms  of daytime fatigue. Kimberly Boyd generally gets 5 or 6 hours of sleep per night, and states that she has nightime awakenings. Snoring is present. Apneic episodes are not present. Epworth Sleepiness Score is 12.  2. SOB (shortness of breath) on exertion Kimberly Boyd notes increasing shortness of breath with exercising and seems to be worsening over time with weight gain. She notes getting out of breath sooner with activity than she used to. This has not gotten worse recently. Kimberly Boyd denies shortness of breath at rest or orthopnea.  3. Primary hypertension Kimberly Boyd is taking propranolol, and Maxzide-25. Her blood pressure is well controlled.  4. Gastroesophageal reflux disease without esophagitis Kimberly Boyd is taking Prilosec as needed.  5. Chronic bilateral low back pain without sciatica Kimberly Boyd is taking diclofenac, and she sees a Restaurant manager, fast food.  6. Primary osteoarthritis of right knee Kimberly Boyd notes "bone on bone".  7. Vitamin D insufficiency Kimberly Boyd is taking Vit D OTC.  8. Pre-diabetes Kimberly Boyd last A1c was 6.4.  9. Other constipation Kimberly Boyd notes constipation.  10. At risk for activity intolerance Kimberly Boyd is at risk for exercise intolerance due to obesity and knee pain.  Assessment/Plan:   1. Other fatigue Kimberly Boyd does feel that her weight is causing her energy to be lower than it should be. Fatigue may be related to obesity, depression or many other causes. Labs will be ordered, and in the meanwhile, Kimberly Boyd will focus on self care including making healthy food choices, increasing physical activity and focusing on stress reduction.  - EKG 12-Lead - Hemoglobin A1c - Insulin, random - VITAMIN D 25 Hydroxy (Vit-D  Deficiency, Fractures)  2. SOB (shortness of breath) on exertion Kimberly Boyd does feel that she gets out of breath more easily that she used to when she exercises. Kimberly Boyd's shortness of breath appears to be obesity related and exercise induced. She has agreed to work on weight loss and gradually  increase exercise to treat her exercise induced shortness of breath. RMR today was 1882. Will continue to monitor closely.  3. Primary hypertension Kimberly Boyd will continue her medications, and will continue working on healthy weight loss and exercise to improve blood pressure control. We will watch for signs of hypotension as she continues her lifestyle modifications.  4. Gastroesophageal reflux disease without esophagitis Intensive lifestyle modifications are the first line treatment for this issue. We discussed several lifestyle modifications today. Kimberly Boyd will continue her medications, and will avoid triggers. She will continue to work on diet, exercise and weight loss efforts. Orders and follow up as documented in patient record.   Counseling . If a person has gastroesophageal reflux disease (GERD), food and stomach acid move back up into the esophagus and cause symptoms or problems such as damage to the esophagus. . Anti-reflux measures include: raising the head of the bed, avoiding tight clothing or belts, avoiding eating late at night, not lying down shortly after mealtime, and achieving weight loss. . Avoid ASA, NSAID's, caffeine, alcohol, and tobacco.  . OTC Pepcid and/or Tums are often very helpful for as needed use.  Marland Kitchen However, for persisting chronic or daily symptoms, stronger medications like Omeprazole may be needed. . You may need to avoid foods and drinks such as: ? Coffee and tea (with or without caffeine). ? Drinks that contain alcohol. ? Energy drinks and sports drinks. ? Bubbly (carbonated) drinks or sodas. ? Chocolate and cocoa. ? Peppermint and mint flavorings. ? Garlic and onions. ? Horseradish. ? Spicy and acidic foods. These include peppers, chili powder, curry powder, vinegar, hot sauces, and BBQ sauce. ? Citrus fruit juices and citrus fruits, such as oranges, lemons, and limes. ? Tomato-based foods. These include red sauce, chili, salsa, and pizza with red  sauce. ? Fried and fatty foods. These include donuts, french fries, potato chips, and high-fat dressings. ? High-fat meats. These include hot dogs, rib eye steak, sausage, ham, and bacon.  5. Chronic bilateral low back pain without sciatica Kimberly Boyd will continue to see the chiropractor.  6. Primary osteoarthritis of right knee Kimberly Boyd was advised not to do pounding exercise, and she will follow up as directed.  7. Vitamin D insufficiency Low Vitamin D level contributes to fatigue and are associated with obesity, breast, and colon cancer. We will check labs today. Jannely agreed to continue taking OTC Vitamin D and will follow-up for routine testing of Vitamin D, at least 2-3 times per year to avoid over-replacement.  - VITAMIN D 25 Hydroxy (Vit-D Deficiency, Fractures)  8. Pre-diabetes Kimberly Boyd will continue to work on weight loss, exercise, increasing healthy fats and protein, and decreasing simple carbohydrates to help decrease the risk of diabetes. We will check labs today.  - Hemoglobin A1c - Insulin, random  9. Other constipation Kimberly Boyd was informed that a decrease in bowel movement frequency is normal while losing weight, and may worsen with increased protein, but stools should not be hard or painful. She is to increase water and fiber. Orders and follow up as documented in patient record.   Counseling Getting to Good Bowel Health: Your goal is to have one soft bowel movement each day. Drink at least 8 glasses  of water each day. Eat plenty of fiber (goal is over 25 grams each day). It is best to get most of your fiber from dietary sources which includes leafy Graver vegetables, fresh fruit, and whole grains. You may need to add fiber with the help of OTC fiber supplements. These include Metamucil, Citrucel, and Flaxseed. If you are still having trouble, try adding Miralax or Magnesium Citrate. If all of these changes do not work, Cabin crew.  10. Depression screening Kimberly Boyd  had a positive depression screening. Depression is commonly associated with obesity and often results in emotional eating behaviors. We will monitor this closely and work on CBT to help improve the non-hunger eating patterns. Referral to Psychology may be required if no improvement is seen as she continues in our clinic.  11. At risk for activity intolerance Kimberly Boyd was given approximately 15 minutes of exercise intolerance counseling today. She is 55 y.o. female and has risk factors exercise intolerance including obesity. We discussed intensive lifestyle modifications today with an emphasis on specific weight loss instructions and strategies. Kimberly Boyd will slowly increase activity as tolerated.  Repetitive spaced learning was employed today to elicit superior memory formation and behavioral change.  12. Class 1 obesity due to excess calories with serious comorbidity and body mass index (BMI) of 33.0 to 33.9 in adult Kimberly Boyd is currently in the action stage of change and her goal is to continue with weight loss efforts. I recommend Carrie begin the structured treatment plan as follows:  She has agreed to the Category 2 Plan.  Mindful eating was discussed. I reviewed labs from 03/12/2020 with the patient.  Exercise goals: No exercise has been prescribed at this time.   Behavioral modification strategies: increasing lean protein intake, decreasing simple carbohydrates, increasing vegetables, increasing water intake, decreasing eating out, no skipping meals, meal planning and cooking strategies, keeping healthy foods in the home and planning for success.  She was informed of the importance of frequent follow-up visits to maximize her success with intensive lifestyle modifications for her multiple health conditions. She was informed we would discuss her lab results at her next visit unless there is a critical issue that needs to be addressed sooner. Shanyia agreed to keep her next visit at the agreed upon  time to discuss these results.  Objective:   Blood pressure 124/81, pulse 69, temperature 98.3 F (36.8 C), height 5\' 7"  (1.702 m), weight 213 lb (96.6 kg), last menstrual period 05/01/2020, SpO2 96 %. Body mass index is 33.36 kg/m.  EKG: Normal sinus rhythm, rate 69 BPM.  Indirect Calorimeter completed today shows a VO2 of 270 and a REE of 1882.  Her calculated basal metabolic rate is 5638 thus her basal metabolic rate is better than expected.  General: Cooperative, alert, well developed, in no acute distress. HEENT: Conjunctivae and lids unremarkable. Cardiovascular: Regular rhythm.  Lungs: Normal work of breathing. Neurologic: No focal deficits.   Lab Results  Component Value Date   CREATININE 0.68 11/26/2013   BUN 13 11/26/2013   NA 139 11/26/2013   K 3.8 11/26/2013   CL 100 11/26/2013   CO2 24 11/26/2013   Lab Results  Component Value Date   ALT 14 07/27/2012   AST 18 07/27/2012   ALKPHOS 62 07/27/2012   BILITOT 0.3 07/27/2012   Lab Results  Component Value Date   HGBA1C 6.2 (H) 05/12/2020   Lab Results  Component Value Date   INSULIN 12.4 05/12/2020   No results found for: TSH No  results found for: CHOL, HDL, LDLCALC, LDLDIRECT, TRIG, CHOLHDL Lab Results  Component Value Date   WBC 4.4 11/26/2013   HGB 12.3 11/26/2013   HCT 36.8 11/26/2013   MCV 93.6 11/26/2013   PLT 313 11/26/2013   No results found for: IRON, TIBC, FERRITIN  Attestation Statements:   Reviewed by clinician on day of visit: allergies, medications, problem list, medical history, surgical history, family history, social history, and previous encounter notes.   Wilhemena Durie, am acting as Location manager for [employer], DO.  I have reviewed the above documentation for accuracy and completeness, and I agree with the above. Jearld Lesch, DO

## 2020-05-14 ENCOUNTER — Encounter (INDEPENDENT_AMBULATORY_CARE_PROVIDER_SITE_OTHER): Payer: Self-pay | Admitting: Bariatrics

## 2020-05-22 DIAGNOSIS — M1711 Unilateral primary osteoarthritis, right knee: Secondary | ICD-10-CM | POA: Diagnosis not present

## 2020-05-25 DIAGNOSIS — H5203 Hypermetropia, bilateral: Secondary | ICD-10-CM | POA: Diagnosis not present

## 2020-05-26 ENCOUNTER — Encounter (INDEPENDENT_AMBULATORY_CARE_PROVIDER_SITE_OTHER): Payer: Self-pay | Admitting: Bariatrics

## 2020-05-26 ENCOUNTER — Other Ambulatory Visit: Payer: Self-pay

## 2020-05-26 ENCOUNTER — Other Ambulatory Visit (INDEPENDENT_AMBULATORY_CARE_PROVIDER_SITE_OTHER): Payer: Self-pay | Admitting: Bariatrics

## 2020-05-26 ENCOUNTER — Ambulatory Visit (INDEPENDENT_AMBULATORY_CARE_PROVIDER_SITE_OTHER): Payer: 59 | Admitting: Bariatrics

## 2020-05-26 VITALS — BP 138/85 | HR 64 | Temp 97.6°F | Ht 67.0 in | Wt 207.0 lb

## 2020-05-26 DIAGNOSIS — Z9189 Other specified personal risk factors, not elsewhere classified: Secondary | ICD-10-CM

## 2020-05-26 DIAGNOSIS — E6609 Other obesity due to excess calories: Secondary | ICD-10-CM

## 2020-05-26 DIAGNOSIS — Z6832 Body mass index (BMI) 32.0-32.9, adult: Secondary | ICD-10-CM | POA: Diagnosis not present

## 2020-05-26 DIAGNOSIS — R7303 Prediabetes: Secondary | ICD-10-CM | POA: Diagnosis not present

## 2020-05-26 DIAGNOSIS — E559 Vitamin D deficiency, unspecified: Secondary | ICD-10-CM | POA: Diagnosis not present

## 2020-05-26 DIAGNOSIS — M79601 Pain in right arm: Secondary | ICD-10-CM | POA: Diagnosis not present

## 2020-05-26 MED ORDER — VITAMIN D (ERGOCALCIFEROL) 1.25 MG (50000 UNIT) PO CAPS: 50000.0000 [IU] | ORAL_CAPSULE | ORAL | 0 refills | Status: DC

## 2020-06-01 ENCOUNTER — Encounter (INDEPENDENT_AMBULATORY_CARE_PROVIDER_SITE_OTHER): Payer: Self-pay | Admitting: Bariatrics

## 2020-06-01 ENCOUNTER — Other Ambulatory Visit (HOSPITAL_COMMUNITY): Payer: Self-pay | Admitting: Family Medicine

## 2020-06-01 DIAGNOSIS — M5412 Radiculopathy, cervical region: Secondary | ICD-10-CM | POA: Diagnosis not present

## 2020-06-01 MED FILL — predniSONE 10 MG (21) TBPK: 10 | 6 days supply | Qty: 21 | Fill #0

## 2020-06-01 MED FILL — valACYclovir HCL 1 GM TABS: 1 | 7 days supply | Qty: 21 | Fill #0

## 2020-06-01 MED FILL — TRIAMTERENE-HCTZ 37.5-25 MG: 37.5-25 | 90 days supply | Qty: 90 | Fill #1

## 2020-06-01 NOTE — Progress Notes (Signed)
Chief Complaint:   OBESITY Kimberly Boyd is here to discuss her progress with her obesity treatment plan along with follow-up of her obesity related diagnoses. Kimberly Boyd is on the Category 2 Plan and states she is following her eating plan approximately 85% of the time. Kimberly Boyd states she is doing weights for 30 minutes 1 time per week, and HITT for 30 minutes 2 times per week.  Today's visit was #: 2 Starting weight: 213 lbs Starting date: 05/12/2020 Today's weight: 207 lbs Today's date: 05/26/2020 Total lbs lost to date: 6 Total lbs lost since last in-office visit: 6  Interim History: Kimberly Boyd is down 6 lbs since the last visit.  Subjective:   1. Pre-diabetes Kimberly Boyd's A1c is 6.2, down from 6.4, and her insulin is 12.4. Her fasting BGs range in the 90's.  2. Vitamin D insufficiency Kimberly Boyd's Vit D level is 40.8.  3. At risk for hyperglycemia Kimberly Boyd is at increased risk for hyperglycemia due to changes in diet, diagnosis of diabetes, and/or insulin use.   Assessment/Plan:   1. Pre-diabetes Kimberly Boyd will work on increasing healthy fats, and decreasing simple carbohydrates to help decrease the risk of diabetes. She will increase cardio and resistance.  2. Vitamin D insufficiency Low Vitamin D level contributes to fatigue and are associated with obesity, breast, and colon cancer. We will refill prescription Vitamin D for 1 month. Kimberly Boyd will follow-up for routine testing of Vitamin D, at least 2-3 times per year to avoid over-replacement.  - Vitamin D, Ergocalciferol, (DRISDOL) 1.25 MG (50000 UNIT) CAPS capsule; Take 1 capsule (50,000 Units total) by mouth every 7 (seven) days.  Dispense: 4 capsule; Refill: 0  3. At risk for hyperglycemia Kimberly Boyd was given approximately 15 minutes of counseling today regarding prevention of hyperglycemia. She was advised of hyperglycemia causes and the fact hyperglycemia is often asymptomatic. Kimberly Boyd was instructed to avoid skipping meals, eat regular protein  rich meals and schedule low calorie but protein rich snacks as needed.   Repetitive spaced learning was employed today to elicit superior memory formation and behavioral change  4. Class 1 obesity due to excess calories with serious comorbidity and body mass index (BMI) of 32.0 to 32.9 in adult Kimberly Boyd is currently in the action stage of change. As such, her goal is to continue with weight loss efforts. She has agreed to the Category 2 Plan.   I reviewed labs with the patient today from 05/12/2020. Lunch and dinner options were given. We discussed a variety of weight loss medications.  Exercise goals: Weights and V-S 2 days per week.  Behavioral modification strategies: increasing lean protein intake, decreasing simple carbohydrates, increasing vegetables, increasing water intake, decreasing eating out, no skipping meals, meal planning and cooking strategies, keeping healthy foods in the home and planning for success.  Kimberly Boyd has agreed to follow-up with our clinic in 2 weeks. She was informed of the importance of frequent follow-up visits to maximize her success with intensive lifestyle modifications for her multiple health conditions.   Objective:   Blood pressure 138/85, pulse 64, temperature 97.6 F (36.4 C), height 5\' 7"  (1.702 m), weight 207 lb (93.9 kg), last menstrual period 05/01/2020, SpO2 98 %. Body mass index is 32.42 kg/m.  General: Cooperative, alert, well developed, in no acute distress. HEENT: Conjunctivae and lids unremarkable. Cardiovascular: Regular rhythm.  Lungs: Normal work of breathing. Neurologic: No focal deficits.   Lab Results  Component Value Date   CREATININE 0.68 11/26/2013   BUN 13 11/26/2013  NA 139 11/26/2013   K 3.8 11/26/2013   CL 100 11/26/2013   CO2 24 11/26/2013   Lab Results  Component Value Date   ALT 14 07/27/2012   AST 18 07/27/2012   ALKPHOS 62 07/27/2012   BILITOT 0.3 07/27/2012   Lab Results  Component Value Date   HGBA1C 6.2  (H) 05/12/2020   Lab Results  Component Value Date   INSULIN 12.4 05/12/2020   No results found for: TSH No results found for: CHOL, HDL, LDLCALC, LDLDIRECT, TRIG, CHOLHDL Lab Results  Component Value Date   WBC 4.4 11/26/2013   HGB 12.3 11/26/2013   HCT 36.8 11/26/2013   MCV 93.6 11/26/2013   PLT 313 11/26/2013   No results found for: IRON, TIBC, FERRITIN  Attestation Statements:   Reviewed by clinician on day of visit: allergies, medications, problem list, medical history, surgical history, family history, social history, and previous encounter notes.   Wilhemena Durie, am acting as Location manager for [employer], DO.  I have reviewed the above documentation for accuracy and completeness, and I agree with the above. Jearld Lesch, DO

## 2020-06-08 ENCOUNTER — Other Ambulatory Visit: Payer: Self-pay

## 2020-06-08 ENCOUNTER — Encounter (INDEPENDENT_AMBULATORY_CARE_PROVIDER_SITE_OTHER): Payer: Self-pay | Admitting: Family Medicine

## 2020-06-08 ENCOUNTER — Ambulatory Visit (INDEPENDENT_AMBULATORY_CARE_PROVIDER_SITE_OTHER): Payer: 59 | Admitting: Family Medicine

## 2020-06-08 VITALS — BP 117/74 | HR 70 | Temp 98.1°F | Ht 67.0 in | Wt 207.0 lb

## 2020-06-08 DIAGNOSIS — Z6833 Body mass index (BMI) 33.0-33.9, adult: Secondary | ICD-10-CM | POA: Diagnosis not present

## 2020-06-08 DIAGNOSIS — E6609 Other obesity due to excess calories: Secondary | ICD-10-CM

## 2020-06-08 DIAGNOSIS — R7303 Prediabetes: Secondary | ICD-10-CM | POA: Diagnosis not present

## 2020-06-08 DIAGNOSIS — M5412 Radiculopathy, cervical region: Secondary | ICD-10-CM | POA: Diagnosis not present

## 2020-06-09 DIAGNOSIS — Z13220 Encounter for screening for lipoid disorders: Secondary | ICD-10-CM | POA: Diagnosis not present

## 2020-06-09 DIAGNOSIS — Z0000 Encounter for general adult medical examination without abnormal findings: Secondary | ICD-10-CM | POA: Diagnosis not present

## 2020-06-09 DIAGNOSIS — R700 Elevated erythrocyte sedimentation rate: Secondary | ICD-10-CM | POA: Diagnosis not present

## 2020-06-09 DIAGNOSIS — G4700 Insomnia, unspecified: Secondary | ICD-10-CM | POA: Diagnosis not present

## 2020-06-09 DIAGNOSIS — Z8739 Personal history of other diseases of the musculoskeletal system and connective tissue: Secondary | ICD-10-CM | POA: Diagnosis not present

## 2020-06-09 DIAGNOSIS — R002 Palpitations: Secondary | ICD-10-CM | POA: Diagnosis not present

## 2020-06-09 DIAGNOSIS — E559 Vitamin D deficiency, unspecified: Secondary | ICD-10-CM | POA: Diagnosis not present

## 2020-06-09 DIAGNOSIS — I10 Essential (primary) hypertension: Secondary | ICD-10-CM | POA: Diagnosis not present

## 2020-06-09 DIAGNOSIS — R739 Hyperglycemia, unspecified: Secondary | ICD-10-CM | POA: Diagnosis not present

## 2020-06-09 DIAGNOSIS — E669 Obesity, unspecified: Secondary | ICD-10-CM | POA: Diagnosis not present

## 2020-06-09 MED FILL — PROPRANOLOL HCL ER 80 MG CP: 80 | 90 days supply | Qty: 90 | Fill #1

## 2020-06-10 ENCOUNTER — Encounter (INDEPENDENT_AMBULATORY_CARE_PROVIDER_SITE_OTHER): Payer: Self-pay | Admitting: Family Medicine

## 2020-06-10 ENCOUNTER — Ambulatory Visit (INDEPENDENT_AMBULATORY_CARE_PROVIDER_SITE_OTHER): Payer: 59 | Admitting: Bariatrics

## 2020-06-10 NOTE — Progress Notes (Signed)
     Chief Complaint:   OBESITY Kimberly Boyd is here to discuss her progress with her obesity treatment plan along with follow-up of her obesity related diagnoses. Kimberly Boyd is on the Category 2 Plan and states she is following her eating plan approximately 60% of the time. Kimberly Boyd states she is doing HIIT for 45 minutes 2 times per week.  Today's visit was #: 3 Starting weight: 213 lbs Starting date: 05/12/2020 Today's weight: 207 lbs Today's date: 06/08/2020 Total lbs lost to date: 6 Total lbs lost since last in-office visit: 0  Interim History: Kimberly Boyd recently went to [place] and she did eat off the plan. She has maintained her weight today. She is going to [place] and [place] next week, and she knows she will be off the plan.  She will try to increase exercise to 4 days per week. She has been on steroids for possible shingles so she is happy with her weight maintenance.  Subjective:   1. Pre-diabetes Kimberly Boyd is not on metformin. Last A1c was 6.2. She notes some polyphagia.   Lab Results  Component Value Date   HGBA1C 6.2 (H) 05/12/2020   Lab Results  Component Value Date   INSULIN 12.4 05/12/2020   Assessment/Plan:   1. Pre-diabetes . Kimberly Boyd will consider starting GLP-1, and we discussed this in depth today. She has no history of pancreatitis or thyroid cancer. She has had cholecystectomy.   2. Obesity: BMI 32 Kimberly Boyd is currently in the action stage of change. As such, her goal is to continue with weight loss efforts. She has agreed to the Category 2 Plan.   Exercise goals: As is, increase exercise to 4 times per week.  Behavioral modification strategies: increasing lean protein intake, decreasing simple carbohydrates and travel eating strategies.  Kimberly Boyd has agreed to follow-up with our clinic in 2 weeks.   Objective:   Blood pressure 117/74, pulse 70, temperature 98.1 F (36.7 C), height 5\' 7"  (1.702 m), weight 207 lb (93.9 kg), SpO2 96 %. Body mass index is 32.42  kg/m.  General: Cooperative, alert, well developed, in no acute distress. HEENT: Conjunctivae and lids unremarkable. Cardiovascular: Regular rhythm.  Lungs: Normal work of breathing. Neurologic: No focal deficits.   Lab Results  Component Value Date   CREATININE 0.68 11/26/2013   BUN 13 11/26/2013   NA 139 11/26/2013   K 3.8 11/26/2013   CL 100 11/26/2013   CO2 24 11/26/2013   Lab Results  Component Value Date   ALT 14 07/27/2012   AST 18 07/27/2012   ALKPHOS 62 07/27/2012   BILITOT 0.3 07/27/2012   Lab Results  Component Value Date   HGBA1C 6.2 (H) 05/12/2020   Lab Results  Component Value Date   INSULIN 12.4 05/12/2020   No results found for: TSH No results found for: CHOL, HDL, LDLCALC, LDLDIRECT, TRIG, CHOLHDL Lab Results  Component Value Date   WBC 4.4 11/26/2013   HGB 12.3 11/26/2013   HCT 36.8 11/26/2013   MCV 93.6 11/26/2013   PLT 313 11/26/2013   No results found for: IRON, TIBC, FERRITIN  Attestation Statements:   Reviewed by clinician on day of visit: allergies, medications, problem list, medical history, surgical history, family history, social history, and previous encounter notes.   Wilhemena Durie, am acting as Location manager for [employer], FNP-C.  I have reviewed the above documentation for accuracy and completeness, and I agree with the above. -  Georgianne Fick, FNP

## 2020-06-11 ENCOUNTER — Telehealth: Payer: Self-pay

## 2020-06-11 NOTE — Telephone Encounter (Signed)
EKG ON FILE °

## 2020-06-25 ENCOUNTER — Encounter (INDEPENDENT_AMBULATORY_CARE_PROVIDER_SITE_OTHER): Payer: Self-pay | Admitting: Family Medicine

## 2020-06-25 ENCOUNTER — Other Ambulatory Visit: Payer: Self-pay

## 2020-06-25 ENCOUNTER — Ambulatory Visit (INDEPENDENT_AMBULATORY_CARE_PROVIDER_SITE_OTHER): Payer: 59 | Admitting: Family Medicine

## 2020-06-25 ENCOUNTER — Other Ambulatory Visit (HOSPITAL_COMMUNITY): Payer: Self-pay

## 2020-06-25 VITALS — BP 124/78 | HR 65 | Temp 98.0°F | Ht 67.0 in | Wt 210.0 lb

## 2020-06-25 DIAGNOSIS — R7303 Prediabetes: Secondary | ICD-10-CM | POA: Diagnosis not present

## 2020-06-25 DIAGNOSIS — E559 Vitamin D deficiency, unspecified: Secondary | ICD-10-CM | POA: Diagnosis not present

## 2020-06-25 DIAGNOSIS — E669 Obesity, unspecified: Secondary | ICD-10-CM

## 2020-06-25 DIAGNOSIS — Z6833 Body mass index (BMI) 33.0-33.9, adult: Secondary | ICD-10-CM

## 2020-06-25 MED ORDER — VITAMIN D (ERGOCALCIFEROL) 1.25 MG (50000 UNIT) PO CAPS
ORAL_CAPSULE | ORAL | 0 refills | Status: DC
  Filled 2020-06-25: qty 4, 28d supply, fill #0

## 2020-06-29 ENCOUNTER — Encounter (INDEPENDENT_AMBULATORY_CARE_PROVIDER_SITE_OTHER): Payer: Self-pay | Admitting: Family Medicine

## 2020-06-29 NOTE — Progress Notes (Signed)
Chief Complaint:   OBESITY Kimberly Boyd is here to discuss her progress with her obesity treatment plan along with follow-up of her obesity related diagnoses. Kimberly Boyd is on the Category 2 Plan and states she is following her eating plan approximately 50% of the time. Kimberly Boyd states she is walking 45-50 minutes 1 times per week.  Today's visit was #: 4 Starting weight: 213 lbs Starting date: 05/12/2020 Today's weight: 210 lbs Today's date: 06/25/2020 Total lbs lost to date: 3 Total lbs lost since last in-office visit: 0  Interim History: Kimberly Boyd has been on a trip to [place] and [place]. She was not able to stick to the plan well. However, she is also up 5 lbs of water weight per bioimpedance scale. She will start back on plan tomorrow.    Subjective:   1. Vitamin D insufficiency Kimberly Boyd's Vit D is low at 40.8. She is on weekly prescription Vit D.  2. Pre-diabetes Kimberly Boyd's last A1c was 6.2. She is not on Metformin.  Lab Results  Component Value Date   HGBA1C 6.2 (H) 05/12/2020   Lab Results  Component Value Date   INSULIN 12.4 05/12/2020    Assessment/Plan:   1. Vitamin D insufficiency  She agrees to continue to take prescription Vitamin D @50 ,000 IU every week and will follow-up for routine testing of Vitamin D, at least 2-3 times per year to avoid over-replacement.  - Vitamin D, Ergocalciferol, (DRISDOL) 1.25 MG (50000 UNIT) CAPS capsule; TAKE 1 CAPSULE BY MOUTH EVERY 7 DAYS  Dispense: 4 capsule; Refill: 0  2. Pre-diabetes  Continue meal plan. Consider starting Wegovy at next OV.  3. Obesity: Current BMI 32 Kimberly Boyd is currently in the action stage of change. As such, her goal is to continue with weight loss efforts. She has agreed to the Category 2 Plan and following a lower carbohydrate, vegetable and lean protein rich diet plan.   Pt will wait to start GLP-1 [facility]).  Exercise goals: As is  Behavioral modification strategies: increasing lean protein intake and  decreasing simple carbohydrates.  Kimberly Boyd has agreed to follow-up with our clinic in 2-3 weeks.   Objective:   Blood pressure 124/78, pulse 65, temperature 98 F (36.7 C), height 5\' 7"  (1.702 m), weight 210 lb (95.3 kg), SpO2 98 %. Body mass index is 32.89 kg/m.  General: Cooperative, alert, well developed, in no acute distress. HEENT: Conjunctivae and lids unremarkable. Cardiovascular: Regular rhythm.  Lungs: Normal work of breathing. Neurologic: No focal deficits.   Lab Results  Component Value Date   CREATININE 0.68 11/26/2013   BUN 13 11/26/2013   NA 139 11/26/2013   K 3.8 11/26/2013   CL 100 11/26/2013   CO2 24 11/26/2013   Lab Results  Component Value Date   ALT 14 07/27/2012   AST 18 07/27/2012   ALKPHOS 62 07/27/2012   BILITOT 0.3 07/27/2012   Lab Results  Component Value Date   HGBA1C 6.2 (H) 05/12/2020   Lab Results  Component Value Date   INSULIN 12.4 05/12/2020   No results found for: TSH No results found for: CHOL, HDL, LDLCALC, LDLDIRECT, TRIG, CHOLHDL Lab Results  Component Value Date   WBC 4.4 11/26/2013   HGB 12.3 11/26/2013   HCT 36.8 11/26/2013   MCV 93.6 11/26/2013   PLT 313 11/26/2013    Attestation Statements:   Reviewed by clinician on day of visit: allergies, medications, problem list, medical history, surgical history, family history, social history, and previous encounter notes.  I,  Kathlene November, am acting as Location manager for [employer], [place].  I have reviewed the above documentation for accuracy and completeness, and I agree with the above. -  Georgianne Fick, FNP

## 2020-07-03 ENCOUNTER — Other Ambulatory Visit (HOSPITAL_COMMUNITY): Payer: Self-pay

## 2020-07-06 ENCOUNTER — Encounter (INDEPENDENT_AMBULATORY_CARE_PROVIDER_SITE_OTHER): Payer: Self-pay | Admitting: Family Medicine

## 2020-07-06 ENCOUNTER — Other Ambulatory Visit: Payer: Self-pay

## 2020-07-06 ENCOUNTER — Telehealth (INDEPENDENT_AMBULATORY_CARE_PROVIDER_SITE_OTHER): Payer: 59 | Admitting: Family Medicine

## 2020-07-06 ENCOUNTER — Other Ambulatory Visit (HOSPITAL_COMMUNITY): Payer: Self-pay

## 2020-07-06 DIAGNOSIS — R7303 Prediabetes: Secondary | ICD-10-CM

## 2020-07-06 DIAGNOSIS — I10 Essential (primary) hypertension: Secondary | ICD-10-CM

## 2020-07-06 DIAGNOSIS — Z6833 Body mass index (BMI) 33.0-33.9, adult: Secondary | ICD-10-CM

## 2020-07-06 DIAGNOSIS — E669 Obesity, unspecified: Secondary | ICD-10-CM

## 2020-07-06 DIAGNOSIS — R632 Polyphagia: Secondary | ICD-10-CM

## 2020-07-06 MED ORDER — WEGOVY 0.25 MG/0.5ML ~~LOC~~ SOAJ
0.2500 mg | SUBCUTANEOUS | 0 refills | Status: DC
  Filled 2020-07-06: qty 2, 28d supply, fill #0

## 2020-07-06 MED ORDER — ONDANSETRON 4 MG PO TBDP
4.0000 mg | ORAL_TABLET | Freq: Four times a day (QID) | ORAL | 0 refills | Status: DC | PRN
  Filled 2020-07-06: qty 12, 3d supply, fill #0

## 2020-07-07 NOTE — Progress Notes (Signed)
TeleHealth Visit:  Due to the COVID-19 pandemic, this visit was completed with telemedicine (audio/video) technology to reduce patient and provider exposure as well as to preserve personal protective equipment.   Kimberly Boyd has verbally consented to this TeleHealth visit. The patient is located at home, the provider is located at the [facility] office. The participants in this visit include the listed provider and patient. The visit was conducted today via MyChart video.  Chief Complaint: OBESITY Kimberly Boyd is here to discuss her progress with her obesity treatment plan along with follow-up of her obesity related diagnoses. Kimberly Boyd is on the Category 2 Plan.   Today's visit was #: 5 Starting weight: 213 lbs Starting date: 05/12/2020  Interim History: Kimberly Boyd's vitamin D level is 40.8.  Okay for her to take OTC.  Assessment/Plan:   1. Essential hypertension At goal. Medications: triamterene-HCTZ 37.5-25 mg daily and propranolol ER 80 mg daily.   Plan: Avoid buying foods that are: processed, frozen, or prepackaged to avoid excess salt. We will watch for signs of hypotension as she continues lifestyle modifications. We will continue to monitor closely alongside her PCP and/or Specialist.  Regular follow up with PCP and specialists was also encouraged.   BP Readings from Last 3 Encounters:  06/25/20 124/78  06/08/20 117/74  05/26/20 138/85   Lab Results  Component Value Date   CREATININE 0.68 11/26/2013   2. Pre-diabetes Not at goal. Goal is HgbA1c < 5.7.  Medication: None.    Plan:  She will continue to focus on protein-rich, low simple carbohydrate foods. We reviewed the importance of hydration, regular exercise for stress reduction, and restorative sleep.   Lab Results  Component Value Date   HGBA1C 6.2 (H) 05/12/2020   Lab Results  Component Value Date   INSULIN 12.4 05/12/2020   3. Polyphagia Not at goal. Current treatment: None.  Polyphagia refers to excessive  feelings of hunger.  Plan:  Start Wegovy 0.25 mg subcutaneously weekly and Zofran 4 mg every 6 hours as needed for nausea.  She will continue to focus on protein-rich, low simple carbohydrate foods. We reviewed the importance of hydration, regular exercise for stress reduction, and restorative sleep.  - Start Semaglutide-Weight Management (WEGOVY) 0.25 MG/0.5ML SOAJ; Inject 0.25 mg into the skin once a week.  Dispense: 2 mL; Refill: 0 - Start ondansetron (ZOFRAN ODT) 4 MG disintegrating tablet; Dissolve 1 tablet (4 mg total) by mouth every 6 (six) hours as needed for nausea or vomiting.  Dispense: 12 tablet; Refill: 0  4. Obesity: Current BMI 32  Kimberly Boyd is currently in the action stage of change. As such, her goal is to continue with weight loss efforts. She has agreed to the Category 2 Plan.   Exercise goals: For substantial health benefits, adults should do at least 150 minutes (2 hours and 30 minutes) a week of moderate-intensity, or 75 minutes (1 hour and 15 minutes) a week of vigorous-intensity aerobic physical activity, or an equivalent combination of moderate- and vigorous-intensity aerobic activity. Aerobic activity should be performed in episodes of at least 10 minutes, and preferably, it should be spread throughout the week.  Behavioral modification strategies: increasing lean protein intake, decreasing simple carbohydrates, increasing vegetables and increasing water intake.  Kimberly Boyd has agreed to follow-up with our clinic in 2-3 weeks. She was informed of the importance of frequent follow-up visits to maximize her success with intensive lifestyle modifications for her multiple health conditions.  Objective:   VITALS: Per patient if applicable, see  vitals. GENERAL: Alert and in no acute distress. CARDIOPULMONARY: No increased WOB. Speaking in clear sentences.  PSYCH: Pleasant and cooperative. Speech normal rate and rhythm. Affect is appropriate. Insight and judgement are appropriate.  Attention is focused, linear, and appropriate.  NEURO: Oriented as arrived to appointment on time with no prompting.   Lab Results  Component Value Date   CREATININE 0.68 11/26/2013   BUN 13 11/26/2013   NA 139 11/26/2013   K 3.8 11/26/2013   CL 100 11/26/2013   CO2 24 11/26/2013   Lab Results  Component Value Date   ALT 14 07/27/2012   AST 18 07/27/2012   ALKPHOS 62 07/27/2012   BILITOT 0.3 07/27/2012   Lab Results  Component Value Date   HGBA1C 6.2 (H) 05/12/2020   Lab Results  Component Value Date   INSULIN 12.4 05/12/2020   Lab Results  Component Value Date   WBC 4.4 11/26/2013   HGB 12.3 11/26/2013   HCT 36.8 11/26/2013   MCV 93.6 11/26/2013   PLT 313 11/26/2013   Attestation Statements:   Reviewed by clinician on day of visit: allergies, medications, problem list, medical history, surgical history, family history, social history, and previous encounter notes.  I, Water quality scientist, CMA, am acting as transcriptionist for Briscoe Deutscher, DO  I have reviewed the above documentation for accuracy and completeness, and I agree with the above. Briscoe Deutscher, DO

## 2020-07-09 ENCOUNTER — Other Ambulatory Visit (INDEPENDENT_AMBULATORY_CARE_PROVIDER_SITE_OTHER): Payer: Self-pay | Admitting: Family Medicine

## 2020-07-09 ENCOUNTER — Other Ambulatory Visit (HOSPITAL_COMMUNITY): Payer: Self-pay

## 2020-07-09 DIAGNOSIS — R632 Polyphagia: Secondary | ICD-10-CM

## 2020-07-09 NOTE — Telephone Encounter (Signed)
Last seen DR Juleen China

## 2020-07-11 ENCOUNTER — Other Ambulatory Visit (HOSPITAL_COMMUNITY): Payer: Self-pay

## 2020-07-11 ENCOUNTER — Other Ambulatory Visit (INDEPENDENT_AMBULATORY_CARE_PROVIDER_SITE_OTHER): Payer: Self-pay | Admitting: Family Medicine

## 2020-07-11 DIAGNOSIS — R632 Polyphagia: Secondary | ICD-10-CM

## 2020-07-13 ENCOUNTER — Other Ambulatory Visit (HOSPITAL_COMMUNITY): Payer: Self-pay

## 2020-07-13 ENCOUNTER — Encounter (INDEPENDENT_AMBULATORY_CARE_PROVIDER_SITE_OTHER): Payer: Self-pay

## 2020-07-13 NOTE — Telephone Encounter (Signed)
Pt last seen by Dr. Wallace.  

## 2020-07-15 DIAGNOSIS — U071 COVID-19: Secondary | ICD-10-CM | POA: Diagnosis not present

## 2020-07-17 ENCOUNTER — Other Ambulatory Visit (HOSPITAL_COMMUNITY): Payer: Self-pay

## 2020-07-17 DIAGNOSIS — R059 Cough, unspecified: Secondary | ICD-10-CM | POA: Diagnosis not present

## 2020-07-17 DIAGNOSIS — U071 COVID-19: Secondary | ICD-10-CM | POA: Diagnosis not present

## 2020-07-17 MED ORDER — ALBUTEROL SULFATE HFA 108 (90 BASE) MCG/ACT IN AERS
INHALATION_SPRAY | RESPIRATORY_TRACT | 0 refills | Status: DC
  Filled 2020-07-17: qty 18, 30d supply, fill #0

## 2020-07-20 ENCOUNTER — Other Ambulatory Visit (HOSPITAL_COMMUNITY): Payer: Self-pay

## 2020-07-20 MED ORDER — SAXENDA 18 MG/3ML ~~LOC~~ SOPN
3.0000 mg | PEN_INJECTOR | Freq: Every day | SUBCUTANEOUS | 0 refills | Status: DC
  Filled 2020-07-20: qty 15, 30d supply, fill #0

## 2020-07-20 MED ORDER — INSULIN PEN NEEDLE 32G X 4 MM MISC
0 refills | Status: DC
  Filled 2020-07-20: qty 100, 90d supply, fill #0

## 2020-07-21 ENCOUNTER — Ambulatory Visit (INDEPENDENT_AMBULATORY_CARE_PROVIDER_SITE_OTHER): Payer: 59 | Admitting: Family Medicine

## 2020-07-22 ENCOUNTER — Ambulatory Visit (INDEPENDENT_AMBULATORY_CARE_PROVIDER_SITE_OTHER): Payer: 59 | Admitting: Internal Medicine

## 2020-07-22 ENCOUNTER — Other Ambulatory Visit: Payer: Self-pay

## 2020-07-22 ENCOUNTER — Other Ambulatory Visit (HOSPITAL_COMMUNITY): Payer: Self-pay

## 2020-07-22 ENCOUNTER — Encounter: Payer: Self-pay | Admitting: Internal Medicine

## 2020-07-22 VITALS — BP 142/88 | HR 68 | Ht 67.0 in | Wt 216.0 lb

## 2020-07-22 DIAGNOSIS — I471 Supraventricular tachycardia: Secondary | ICD-10-CM | POA: Diagnosis not present

## 2020-07-22 DIAGNOSIS — R002 Palpitations: Secondary | ICD-10-CM

## 2020-07-22 NOTE — Patient Instructions (Signed)
Medication Instructions:  Your physician recommends that you continue on your current medications as directed. Please refer to the Current Medication list given to you today.  *If you need a refill on your cardiac medications before your next appointment, please call your pharmacy*   Lab Work: None ordered.  If you have labs (blood work) drawn today and your tests are completely normal, you will receive your results only by: Marland Kitchen MyChart Message (if you have MyChart) OR . A paper copy in the mail If you have any lab test that is abnormal or we need to change your treatment, we will call you to review the results.   Testing/Procedures: None ordered.    Follow-Up: At [facility], you and your health needs are our priority.  As part of our continuing mission to provide you with exceptional heart care, we have created designated Provider Care Teams.  These Care Teams include your primary Cardiologist (physician) and Advanced Practice Providers (APPs -  Physician Assistants and Nurse Practitioners) who all work together to provide you with the care you need, when you need it.  We recommend signing up for the patient portal called "MyChart".  Sign up information is provided on this After Visit Summary.  MyChart is used to connect with patients for Virtual Visits (Telemedicine).  Patients are able to view lab/test results, encounter notes, upcoming appointments, etc.  Non-urgent messages can be sent to your provider as well.   To learn more about what you can do with MyChart, go to [email or web address].    Your next appointment:   09/15/2020 at 315pm with Dr Caryl Comes   Other Instructions Purchase [employer]Cor by [employer]

## 2020-07-22 NOTE — Progress Notes (Signed)
ELECTROPHYSIOLOGY OFFICE NOTE  Patient ID: Kimberly Boyd, MRN: [id number], DOB/AGE: Aug 05, 1965 55 y.o. Admit date: (Not on file) Date of Consult: 07/22/2020  Primary Physician: Vernie Shanks, MD Primary Cardiologist: new     Kimberly Boyd is a 55 y.o. female who is being seen today for the evaluation of palpitations  HPI Kimberly Boyd is a 55 y.o. female seen again after hiatus of almost 10 years for adenosine sensitive SVT-initial episode.  She is seen now because of the recurrence of irregular palpitations, typically lasting minutes but sometimes up to an hour.  Distinct from her prior SVT.  Described as irregular.  Great deal of stress.  Recently evaluated at the [facility] weight loss clinic.         Past Medical History:  Diagnosis Date  . Arthritis   . Arthritis of left foot   . Arthritis of right knee   . Back pain years.    chronic low back pain   . Chronic back pain   . Complication of anesthesia    tachycardia, difficulty waking up  . GERD (gastroesophageal reflux disease)   . Heart murmur    questionable  . Hypertension   . Obesity   . Palpitations       Surgical History:  Past Surgical History:  Procedure Laterality Date  . BREAST BIOPSY Right    Benign approx age 63  . cervical dysplasia laser    . dermoid cyst removal    . IR GENERIC HISTORICAL  06/04/2014   IR RADIOLOGIST EVAL & MGMT 06/04/2014 Sandi Mariscal, MD [facility] INTERV RAD  . LAPAROSCOPIC CHOLECYSTECTOMY    . LIPOMA RESECTION     and redone  . ORIF ANKLE FRACTURE  08/09/2011   Procedure: OPEN REDUCTION INTERNAL FIXATION (ORIF) ANKLE FRACTURE;  Surgeon: Wylene Simmer, MD;  Location: [place];  Service: Orthopedics;  Laterality: Right;  ORIF Right Ankle Lateral Malleolus  . scar revision for the lipoma       Home Meds: Current Meds  Medication Sig  . albuterol (VENTOLIN HFA) 108 (90 Base) MCG/ACT inhaler Inhale 1 puff into the lungs every 4 hrs as needed  . ALPRAZolam (XANAX) 0.5 MG tablet  Take 0.5 mg by mouth as needed for anxiety.  . diclofenac (VOLTAREN) 75 MG EC tablet Take 1 tablet (75 mg total) by mouth 2 (two) times daily.  Marland Kitchen ELDERBERRY PO Take by mouth.  . fluticasone (FLONASE) 50 MCG/ACT nasal spray PLACE 2 SPRAYS IN EACH NOSTRIL DAILY  . Insulin Pen Needle 32G X 4 MM MISC Use daily with Saxenda  . Liraglutide -Weight Management (SAXENDA) 18 MG/3ML SOPN Inject 3 mg into the skin daily.  . magnesium 30 MG tablet Take 250 mg by mouth 2 (two) times daily.  Marland Kitchen omeprazole (PRILOSEC) 40 MG capsule   . propranolol ER (INDERAL LA) 80 MG 24 hr capsule Take 80 mg by mouth every morning.  . triamterene-hydrochlorothiazide (MAXZIDE-25) 37.5-25 MG tablet TAKE 1 TABLET BY MOUTH EVERY MORNING  . zinc sulfate 220 (50 Zn) MG capsule Take 220 mg by mouth daily.    Allergies:  Allergies  Allergen Reactions  . Doxycycline Other (See Comments)    Esophagitis; opened up in my esophagus    Social History   Socioeconomic History  . Marital status: Married    Spouse name: Legrand Como  . Number of children: Not on file  . Years of education: Not on file  . Highest education level: Not  on file  Occupational History  . Occupation: Conservation officer, historic buildings  Tobacco Use  . Smoking status: Never Smoker  . Smokeless tobacco: Never Used  Vaping Use  . Vaping Use: Never used  Substance and Sexual Activity  . Alcohol use: Yes    Comment: rare wine  . Drug use: No  . Sexual activity: Yes    Partners: Male  Other Topics Concern  . Not on file  Social History Narrative   Married with 2 children. 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    Social Determinants of Health   Financial Resource Strain: Not on file  Food Insecurity: Not on file  Transportation Needs: Not on file  Physical Activity: Not on file  Stress: Not on file  Social Connections: Not on file  Intimate Partner Violence: Not on file     Family History  Problem Relation Age of Onset   . High blood pressure Mother   . Cancer Mother   . High blood pressure Father   . High Cholesterol Father   . Stroke Father   . Kidney disease Father   . Cancer Father      ROS:  Please see the history of present illness.     All other systems reviewed and negative.    Physical Exam: Blood pressure (!) 142/88, pulse 68, height 5\' 7"  (1.702 m), weight 216 lb (98 kg), SpO2 95 %. General: Well developed, well nourished female in no acute distress. Head: Normocephalic, atraumatic, sclera non-icteric, no xanthomas, nares are without discharge. EENT: normal  Lymph Nodes:  none Neck: Negative for carotid bruits. JVD not elevated. Back:without scoliosis kyphosis Lungs: Clear bilaterally to auscultation without wheezes, rales, or rhonchi. Breathing is unlabored. Heart: RRR with S1 S2. No murmur . No rubs, or gallops appreciated. Abdomen: Soft, non-tender, non-distended with normoactive bowel sounds. No hepatomegaly. No rebound/guarding. No obvious abdominal masses. Msk:  Strength and tone appear normal for age. Extremities: No clubbing edema.  Distal pedal pulses are 2+ and equal bilaterally. Skin: Warm and Dry Neuro: Alert and oriented X 3. CN III-XII intact Grossly normal sensory and motor function . Psych:  Responds to questions appropriately with a normal affect.      Labs: Cardiac Enzymes No results for input(s): CKTOTAL, CKMB, TROPONINI in the last 72 hours. CBC Lab Results  Component Value Date   WBC 4.4 11/26/2013   HGB 12.3 11/26/2013   HCT 36.8 11/26/2013   MCV 93.6 11/26/2013   PLT 313 11/26/2013   PROTIME: No results for input(s): LABPROT, INR in the last 72 hours. Chemistry No results for input(s): NA, K, CL, CO2, BUN, CREATININE, CALCIUM, PROT, BILITOT, ALKPHOS, ALT, AST, GLUCOSE in the last 168 hours.  Invalid input(s): LABALBU Lipids No results found for: CHOL, HDL, LDLCALC, TRIG BNP No results found for: PROBNP Thyroid Function Tests: No results for  input(s): TSH, T4TOTAL, T3FREE, THYROIDAB in the last 72 hours.  Invalid input(s): FREET3 Miscellaneous No results found for: DDIMER  Radiology/Studies:  No results found.  EKG: Sinus at 68 Interval 17/08/37 Nonspecific T wave changes   Assessment and Plan:  Palpitations  SVT  Anxiety  Obesity   The patient has recurrent palpitations increasingly frequent.  The description is not consistent with SVT; hence, we will use an AliveCor monitor to try to elucidate the mechanism.  Suspect atrial or ventricular ectopy.  Not atrial fibrillation.  For now we will hold off on any other  therapies.  Discussed the importance of weight loss and exercise.  Options are limited because of apparently bad knee arthritis.  Virl Axe

## 2020-07-23 ENCOUNTER — Other Ambulatory Visit (HOSPITAL_COMMUNITY): Payer: Self-pay

## 2020-07-28 ENCOUNTER — Other Ambulatory Visit (HOSPITAL_COMMUNITY): Payer: Self-pay

## 2020-07-30 ENCOUNTER — Encounter (INDEPENDENT_AMBULATORY_CARE_PROVIDER_SITE_OTHER): Payer: Self-pay | Admitting: Bariatrics

## 2020-07-30 ENCOUNTER — Other Ambulatory Visit (HOSPITAL_COMMUNITY): Payer: Self-pay

## 2020-08-01 ENCOUNTER — Other Ambulatory Visit (HOSPITAL_COMMUNITY): Payer: Self-pay

## 2020-08-04 ENCOUNTER — Encounter (INDEPENDENT_AMBULATORY_CARE_PROVIDER_SITE_OTHER): Payer: Self-pay | Admitting: Family Medicine

## 2020-08-04 ENCOUNTER — Ambulatory Visit (INDEPENDENT_AMBULATORY_CARE_PROVIDER_SITE_OTHER): Payer: 59 | Admitting: Family Medicine

## 2020-08-04 ENCOUNTER — Other Ambulatory Visit: Payer: Self-pay

## 2020-08-04 VITALS — BP 106/70 | HR 67 | Temp 97.9°F | Ht 67.0 in | Wt 211.0 lb

## 2020-08-04 DIAGNOSIS — E669 Obesity, unspecified: Secondary | ICD-10-CM

## 2020-08-04 DIAGNOSIS — R7303 Prediabetes: Secondary | ICD-10-CM | POA: Diagnosis not present

## 2020-08-04 DIAGNOSIS — Z6833 Body mass index (BMI) 33.0-33.9, adult: Secondary | ICD-10-CM

## 2020-08-04 NOTE — Progress Notes (Signed)
     Chief Complaint:   OBESITY Kimberly Boyd is here to discuss her progress with her obesity treatment plan along with follow-up of her obesity related diagnoses. Kimberly Boyd is on the Category 2 Plan and states she is following her eating plan approximately 40% of the time. Kimberly Boyd states she is not currently exercising.  Today's visit was #: 6 Starting weight: 213 lbs Starting date: 05/12/2020 Today's weight: 211 lbs Today's date: 08/04/2020 Total lbs lost to date: 2 Total lbs lost since last in-office visit: +1  Interim History: Kimberly Boyd had [place] recently and is still having SOB and fatigue. She has not been able to focus on the plan. However, she has started getting back on the plan over the last few days.  She has not started [place] yet. She is picking it up today.  Subjective:   1. Pre-diabetes Kimberly Boyd's last A1c was 6.2. She reports polyphagia. She is not on Metformin.  Lab Results  Component Value Date   HGBA1C 6.2 (H) 05/12/2020   Lab Results  Component Value Date   INSULIN 12.4 05/12/2020    Assessment/Plan:   1. Pre-diabetes  -Start Saxenda.  2. Obesity: Current BMI 33.04 Kimberly Boyd is currently in the action stage of change. As such, her goal is to continue with weight loss efforts. She has agreed to the Category 2 Plan.   Kimberly Boyd will start Saxenda at 0.3 mg daily.  Discussed how to titrate. Pt may titrate up as needed slowly if no side effects.  Exercise goals: No exercise has been prescribed at this time.  Behavioral modification strategies: increasing lean protein intake and decreasing simple carbohydrates.  Kimberly Boyd has agreed to follow-up with our clinic in 4 weeks.   Objective:   Blood pressure 106/70, pulse 67, temperature 97.9 F (36.6 C), temperature source Oral, height 5\' 7"  (1.702 m), weight 211 lb (95.7 kg), SpO2 98 %. Body mass index is 33.05 kg/m.  General: Cooperative, alert, well developed, in no acute distress. HEENT: Conjunctivae and lids  unremarkable. Cardiovascular: Regular rhythm.  Lungs: Normal work of breathing. Neurologic: No focal deficits.   Lab Results  Component Value Date   CREATININE 0.68 11/26/2013   BUN 13 11/26/2013   NA 139 11/26/2013   K 3.8 11/26/2013   CL 100 11/26/2013   CO2 24 11/26/2013   Lab Results  Component Value Date   ALT 14 07/27/2012   AST 18 07/27/2012   ALKPHOS 62 07/27/2012   BILITOT 0.3 07/27/2012   Lab Results  Component Value Date   HGBA1C 6.2 (H) 05/12/2020   Lab Results  Component Value Date   INSULIN 12.4 05/12/2020   No results found for: TSH No results found for: CHOL, HDL, LDLCALC, LDLDIRECT, TRIG, CHOLHDL Lab Results  Component Value Date   WBC 4.4 11/26/2013   HGB 12.3 11/26/2013   HCT 36.8 11/26/2013   MCV 93.6 11/26/2013   PLT 313 11/26/2013   No results found for: IRON, TIBC, FERRITIN   Attestation Statements:   Reviewed by clinician on day of visit: allergies, medications, problem list, medical history, surgical history, family history, social history, and previous encounter notes.  Coral Ceo, CMA, am acting as Location manager for [employer], [place].  I have reviewed the above documentation for accuracy and completeness, and I agree with the above. -  Georgianne Fick, FNP

## 2020-08-05 ENCOUNTER — Telehealth: Payer: Self-pay | Admitting: Internal Medicine

## 2020-08-05 ENCOUNTER — Encounter (INDEPENDENT_AMBULATORY_CARE_PROVIDER_SITE_OTHER): Payer: Self-pay

## 2020-08-05 NOTE — Telephone Encounter (Signed)
Called patient to inform her it is her decision whether or not to purchase 1 year membership.  I informed her that she could save strips to be reviewed later.  She thanked me for calling her back.

## 2020-08-05 NOTE — Telephone Encounter (Signed)
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 She wants to know if she needs to purchase the year membership?

## 2020-08-18 ENCOUNTER — Encounter: Payer: Self-pay | Admitting: Neurology

## 2020-08-18 ENCOUNTER — Ambulatory Visit: Payer: 59 | Admitting: Neurology

## 2020-08-18 VITALS — BP 120/77 | HR 72 | Ht 66.5 in | Wt 211.6 lb

## 2020-08-18 DIAGNOSIS — M542 Cervicalgia: Secondary | ICD-10-CM

## 2020-08-18 DIAGNOSIS — M792 Neuralgia and neuritis, unspecified: Secondary | ICD-10-CM | POA: Diagnosis not present

## 2020-08-18 NOTE — Progress Notes (Signed)
Reason for visit: Right arm pain  Referring physician: Dr. Barbaraann Cao is a 55 y.o. female  History of present illness:  [name] is a 55 year old left-handed black female with a history of prediabetes.  The patient had onset spontaneously in April 2022 with sensory alteration in the mid triceps area posteriorly of the right arm.  The patient reported a stinging and tingling sensation with even light touch to the skin of that area.  The pain initially was fairly severe but has now gradually improved over time.  The patient went to her primary care physician who treated her empirically for shingles with Valtrex and prednisone.  The patient however has continued to have some discomfort.  She does have chronic low back pain and neck discomfort, but she denies any radicular component from the neck.  She has crepitus in the neck when turning her head from side to side but there is no alteration in the right arm sensation with head turning.  She reports no weakness of the extremities, she has not had any other sensory abnormalities on the body with exception that more recently she has had some numbness of the low right lower abdomen that is unassociated with pain.  The patient reports no balance issues or difficulty controlling the bowels or the bladder.  She does have severe arthritis affecting the right knee, she indicates that she is followed through orthopedic surgery for this.  She does see a chiropractor intermittently for her neck with some benefit.  She denies headaches coming from the neck.  She comes to this office for further evaluation.  Past Medical History:  Diagnosis Date  . Arthritis   . Arthritis of left foot   . Arthritis of right knee   . Back pain years.    chronic low back pain   . Chronic back pain   . Complication of anesthesia    tachycardia, difficulty waking up  . GERD (gastroesophageal reflux disease)   . Heart murmur    questionable  . Hypertension   .  Obesity   . Palpitations     Past Surgical History:  Procedure Laterality Date  . BREAST BIOPSY Right    Benign approx age 60  . cervical dysplasia laser    . dermoid cyst removal    . IR GENERIC HISTORICAL  06/04/2014   IR RADIOLOGIST EVAL & MGMT 06/04/2014 Sandi Mariscal, MD [facility] INTERV RAD  . LAPAROSCOPIC CHOLECYSTECTOMY    . LIPOMA RESECTION     and redone  . ORIF ANKLE FRACTURE  08/09/2011   Procedure: OPEN REDUCTION INTERNAL FIXATION (ORIF) ANKLE FRACTURE;  Surgeon: Wylene Simmer, MD;  Location: [place];  Service: Orthopedics;  Laterality: Right;  ORIF Right Ankle Lateral Malleolus  . scar revision for the lipoma      Family History  Problem Relation Age of Onset  . High blood pressure Mother   . Cancer Mother   . High blood pressure Father   . High Cholesterol Father   . Stroke Father   . Kidney disease Father   . Cancer Father     Social history:  reports that she has never smoked. She has never used smokeless tobacco. She reports current alcohol use. She reports that she does not use drugs.  Medications:  Prior to Admission medications   Medication Sig Start Date End Date Taking? Authorizing Provider  ALPRAZolam Duanne Moron) 0.5 MG tablet Take 0.5 mg by mouth as needed for anxiety.  Yes [provider]  diclofenac (VOLTAREN) 75 MG EC tablet Take 1 tablet (75 mg total) by mouth 2 (two) times daily. 09/29/17  Yes Regal, Tamala Fothergill, DPM  ELDERBERRY PO Take by mouth.   Yes [provider]  fluticasone (FLONASE) 50 MCG/ACT nasal spray PLACE 2 SPRAYS IN EACH NOSTRIL DAILY 02/08/20 02/07/21 Yes   Insulin Pen Needle 32G X 4 MM MISC Use daily with Saxenda 07/20/20  Yes Briscoe Deutscher, DO  Liraglutide -Weight Management (SAXENDA) 18 MG/3ML SOPN Inject 3 mg into the skin daily. 07/20/20  Yes Briscoe Deutscher, DO  magnesium 30 MG tablet Take 250 mg by mouth 2 (two) times daily.   Yes [provider]  omeprazole (PRILOSEC) 40 MG capsule  07/24/17  Yes [provider]  propranolol ER (INDERAL LA) 80 MG 24 hr capsule Take 80 mg by mouth every morning.   Yes [provider]  triamterene-hydrochlorothiazide (MAXZIDE-25) 37.5-25 MG tablet TAKE 1 TABLET BY MOUTH EVERY MORNING 12/09/19 12/08/20 Yes Vernie Shanks, MD  zinc sulfate 220 (50 Zn) MG capsule Take 220 mg by mouth daily.   Yes [provider]      Allergies  Allergen Reactions  . Doxycycline Other (See Comments)    Esophagitis; opened up in my esophagus    ROS:  Out of a complete 14 system review of symptoms, the patient complains only of the following symptoms, and all other reviewed systems are negative.  Right arm discomfort Abdominal numbness Right knee arthritis Neck and low back pain  Blood pressure 120/77, pulse 72, height 5' 6.5" (1.689 m), weight 211 lb 9.6 oz (96 kg).  Physical Exam  General: The patient is alert and cooperative at the time of the examination.  Eyes: Pupils are equal, round, and reactive to light. Discs are flat bilaterally.  Neck: The neck is supple, no carotid bruits are noted.  Respiratory: The respiratory examination is clear.  Cardiovascular: The cardiovascular examination reveals a regular rate and rhythm, no obvious murmurs or rubs are noted.  Neuromuscular: The patient lacks only about 10 degrees of full lateral rotation of the cervical spine bilaterally.  Skin: Extremities are without significant edema.  Neurologic Exam  Mental status: The patient is alert and oriented x 3 at the time of the examination. The patient has apparent normal recent and remote memory, with an apparently normal attention span and concentration ability.  Cranial nerves: Facial symmetry is present. There is good sensation of the face to pinprick and soft touch bilaterally. The strength of the facial muscles and the muscles to head turning and shoulder shrug are normal bilaterally. Speech is well enunciated, no aphasia or dysarthria is noted. Extraocular  movements are full. Visual fields are full. The tongue is midline, and the patient has symmetric elevation of the soft palate. No obvious hearing deficits are noted.  Motor: The motor testing reveals 5 over 5 strength of all 4 extremities. Good symmetric motor tone is noted throughout.  Sensory: Sensory testing is intact to pinprick, soft touch, vibration sensation, and position sense on all 4 extremities. No evidence of extinction is noted.  Coordination: Cerebellar testing reveals good finger-nose-finger and heel-to-shin bilaterally.  Gait and station: Gait is normal. Tandem gait is normal. Romberg is negative. No drift is seen.  Reflexes: Deep tendon reflexes are symmetric and normal bilaterally. Toes are downgoing bilaterally.   Assessment/Plan:  1.  Right arm discomfort  The description of the pain and sensory alteration is clearly suggestive of a  neuritis affecting the right arm.  It is possible that she could have had a shingles outbreak without rash or could have a diabetic related neuritis.  The patient will be sent for blood work today, she will have an x-ray of the cervical spine and nerve conductions of both arms and EMG on the right arm.  We may consider in the future getting MRI of the cervical spine depending upon the results of the above.  She will follow-up for the EMG evaluation.  Jill Alexanders MD 08/18/2020 8:46 AM  [facility] [address] [place], [place] [address]  Phone [phone] Fax [phone]

## 2020-08-20 ENCOUNTER — Ambulatory Visit: Payer: 59 | Admitting: Neurology

## 2020-08-22 LAB — PAN-ANCA
ANCA Proteinase 3: <3.5 U/mL (ref 0.0–3.5)
Atypical pANCA: 1:20 {titer} — ABNORMAL HIGH
C-ANCA: <1:20 {titer}
Myeloperoxidase Ab: <9 U/mL (ref 0.0–9.0)
P-ANCA: <1:20 {titer}

## 2020-08-22 LAB — ANA W/REFLEX: Anti Nuclear Antibody (ANA): NEGATIVE

## 2020-08-22 LAB — SEDIMENTATION RATE: Sed Rate: 35 mm/hr (ref 0–40)

## 2020-08-22 LAB — LYME DISEASE DNA BY PCR(BORRELIA BURG): Lyme (B. burgdorferi) PCR: NEGATIVE

## 2020-09-01 ENCOUNTER — Encounter (INDEPENDENT_AMBULATORY_CARE_PROVIDER_SITE_OTHER): Payer: Self-pay | Admitting: Family Medicine

## 2020-09-01 ENCOUNTER — Ambulatory Visit (INDEPENDENT_AMBULATORY_CARE_PROVIDER_SITE_OTHER): Payer: 59 | Admitting: Family Medicine

## 2020-09-01 ENCOUNTER — Other Ambulatory Visit: Payer: Self-pay

## 2020-09-01 VITALS — BP 109/74 | HR 81 | Temp 98.2°F | Ht 67.0 in | Wt 205.0 lb

## 2020-09-01 DIAGNOSIS — E669 Obesity, unspecified: Secondary | ICD-10-CM | POA: Diagnosis not present

## 2020-09-01 DIAGNOSIS — Z6833 Body mass index (BMI) 33.0-33.9, adult: Secondary | ICD-10-CM

## 2020-09-01 DIAGNOSIS — R7303 Prediabetes: Secondary | ICD-10-CM | POA: Diagnosis not present

## 2020-09-02 ENCOUNTER — Encounter (INDEPENDENT_AMBULATORY_CARE_PROVIDER_SITE_OTHER): Payer: Self-pay | Admitting: Family Medicine

## 2020-09-02 NOTE — Progress Notes (Signed)
     Chief Complaint:   OBESITY Kimberly Boyd is here to discuss her progress with her obesity treatment plan along with follow-up of her obesity related diagnoses. Kimberly Boyd is on the Category 2 Plan and states she is following her eating plan approximately 75% of the time. Kimberly Boyd states she is doing calisthenics and bike 20 minutes 3-7 times per week.  Today's visit was #: 7 Starting weight: 213 lbs Starting date: 05/12/2020 Today's weight: 205 lbs Today's date: 09/01/2020 Total lbs lost to date: 8 Total lbs lost since last in-office visit: 6  Interim History: Kimberly Boyd notes good appetite suppression with Saxenda. She is on 1.8 mg daily. She has had some mild nausea. She is not always getting all of her protein in.  Subjective:   1. Pre-diabetes Kimberly Boyd's last A1c was 6.2. She is on Saxenda.  Lab Results  Component Value Date   HGBA1C 6.2 (H) 05/12/2020   Lab Results  Component Value Date   INSULIN 12.4 05/12/2020   Assessment/Plan:   1. Pre-diabetes Continue Saxenda at 1.8 mg QD dose until she has no nausea and then she may increase to 2.4 mg daily.  2. Obesity: Current BMI 32.1  Kimberly Boyd is currently in the action stage of change. As such, her goal is to continue with weight loss efforts. She has agreed to the Category 2 Plan.   Hold at 1.8 mg of Saxenda QD, until nausea subsides completely. Eat protein first.  Exercise goals:  As is  Behavioral modification strategies: increasing lean protein intake.  Kimberly Boyd has agreed to follow-up with our clinic in 3 weeks.  Objective:   Blood pressure 109/74, pulse 81, temperature 98.2 F (36.8 C), temperature source Oral, height 5\' 7"  (1.702 m), weight 205 lb (93 kg), SpO2 93 %. Body mass index is 32.11 kg/m.  General: Cooperative, alert, well developed, in no acute distress. HEENT: Conjunctivae and lids unremarkable. Cardiovascular: Regular rhythm.  Lungs: Normal work of breathing. Neurologic: No focal deficits.   Lab Results   Component Value Date   CREATININE 0.68 11/26/2013   BUN 13 11/26/2013   NA 139 11/26/2013   K 3.8 11/26/2013   CL 100 11/26/2013   CO2 24 11/26/2013   Lab Results  Component Value Date   ALT 14 07/27/2012   AST 18 07/27/2012   ALKPHOS 62 07/27/2012   BILITOT 0.3 07/27/2012   Lab Results  Component Value Date   HGBA1C 6.2 (H) 05/12/2020   Lab Results  Component Value Date   INSULIN 12.4 05/12/2020   No results found for: TSH No results found for: CHOL, HDL, LDLCALC, LDLDIRECT, TRIG, CHOLHDL Lab Results  Component Value Date   WBC 4.4 11/26/2013   HGB 12.3 11/26/2013   HCT 36.8 11/26/2013   MCV 93.6 11/26/2013   PLT 313 11/26/2013   No results found for: IRON, TIBC, FERRITIN  Attestation Statements:   Reviewed by clinician on day of visit: allergies, medications, problem list, medical history, surgical history, family history, social history, and previous encounter notes.  Coral Ceo, CMA, am acting as Location manager for [employer], [place].  I have reviewed the above documentation for accuracy and completeness, and I agree with the above. -  Georgianne Fick, FNP

## 2020-09-07 ENCOUNTER — Other Ambulatory Visit (HOSPITAL_COMMUNITY): Payer: Self-pay

## 2020-09-07 MED FILL — Triamterene & Hydrochlorothiazide Tab 37.5-25 MG: ORAL | 90 days supply | Qty: 90 | Fill #0 | Status: AC

## 2020-09-15 ENCOUNTER — Ambulatory Visit: Payer: 59 | Admitting: Neurology

## 2020-09-15 ENCOUNTER — Ambulatory Visit: Payer: 59 | Admitting: Internal Medicine

## 2020-09-21 ENCOUNTER — Other Ambulatory Visit (HOSPITAL_COMMUNITY): Payer: Self-pay

## 2020-09-21 MED FILL — Propranolol HCl Cap ER 24HR 80 MG: ORAL | 90 days supply | Qty: 90 | Fill #0 | Status: AC

## 2020-09-22 ENCOUNTER — Ambulatory Visit (INDEPENDENT_AMBULATORY_CARE_PROVIDER_SITE_OTHER): Payer: 59 | Admitting: Family Medicine

## 2020-09-28 ENCOUNTER — Telehealth: Payer: Self-pay | Admitting: Neurology

## 2020-09-28 NOTE — Telephone Encounter (Signed)
Mychart msg sent regarding r/s needed for ncv/emg, pt's vm box full.

## 2020-09-29 ENCOUNTER — Encounter: Payer: 59 | Admitting: Neurology

## 2020-10-05 ENCOUNTER — Ambulatory Visit (INDEPENDENT_AMBULATORY_CARE_PROVIDER_SITE_OTHER): Payer: 59 | Admitting: Family Medicine

## 2020-10-05 ENCOUNTER — Other Ambulatory Visit (HOSPITAL_COMMUNITY): Payer: Self-pay

## 2020-10-05 ENCOUNTER — Encounter (INDEPENDENT_AMBULATORY_CARE_PROVIDER_SITE_OTHER): Payer: Self-pay | Admitting: Family Medicine

## 2020-10-05 ENCOUNTER — Other Ambulatory Visit: Payer: Self-pay

## 2020-10-05 VITALS — BP 125/78 | HR 72 | Temp 98.3°F | Ht 67.0 in | Wt 204.0 lb

## 2020-10-05 DIAGNOSIS — Z6833 Body mass index (BMI) 33.0-33.9, adult: Secondary | ICD-10-CM | POA: Diagnosis not present

## 2020-10-05 DIAGNOSIS — E669 Obesity, unspecified: Secondary | ICD-10-CM

## 2020-10-05 DIAGNOSIS — R7303 Prediabetes: Secondary | ICD-10-CM

## 2020-10-05 DIAGNOSIS — I10 Essential (primary) hypertension: Secondary | ICD-10-CM

## 2020-10-05 MED ORDER — WEGOVY 1.7 MG/0.75ML ~~LOC~~ SOAJ
1.7000 mg | SUBCUTANEOUS | 0 refills | Status: DC
Start: 2020-10-05 — End: 2020-10-26
  Filled 2020-10-05: qty 3, 28d supply, fill #0

## 2020-10-09 DIAGNOSIS — M1711 Unilateral primary osteoarthritis, right knee: Secondary | ICD-10-CM | POA: Diagnosis not present

## 2020-10-10 NOTE — Progress Notes (Signed)
Chief Complaint:   OBESITY Kimberly Boyd is here to discuss her progress with her obesity treatment plan along with follow-up of her obesity related diagnoses. [name] is on the Category 2 Plan and states she is following her eating plan approximately 50% of the time. [name] states she is doing stationary bike and calisthenics 30 minutes 2 times per week.  Today's visit was #: 8 Starting weight: 213 lbs Starting date: 05/12/2020 Today's weight: 204 lbs Today's date: 10/05/2020 Total lbs lost to date: 9 lbs Total lbs lost since last in-office visit: 1 lb  Interim History: [name] has been on 2.4 mg Saxenda. She is tolerating it well other than severe fatigue in the evening. She feels she has been snacking more in the evening. She says she has been lacking commitment to the plan.  Subjective:   1. Pre-diabetes Kimberly Boyd's last A1C was 6.2. She is on Saxenda. She wants to switch to [facility]. Saxenda causes severe fatigue daily.   Lab Results  Component Value Date   HGBA1C 6.2 (H) 05/12/2020   Lab Results  Component Value Date   INSULIN 12.4 05/12/2020    2. Essential hypertension Kimberly Boyd's hypertension is well controlled on Propranolol and Maxzide.  BP Readings from Last 3 Encounters:  10/05/20 125/78  09/01/20 109/74  08/18/20 120/77     Assessment/Plan:   1. Pre-diabetes Kimberly Boyd agreed to start Wegovy 1.7 ng weekly with no refills She will discontinue Saxenda.   - Semaglutide-Weight Management (WEGOVY) 1.7 MG/0.75ML SOAJ; Inject 1.7 mg into the skin once a week.  Dispense: 3 mL; Refill: 0  2. Essential hypertension Kimberly Boyd will continue Propranolol and Maxzide.   3. Obesity: Current BMI 31.94  Kimberly Boyd is currently in the action stage of change. As such, her goal is to continue with weight loss efforts. She has agreed to the Category 2 Plan.   Recipes: egg muffins.   Exercise goals:  Increase exercise to 3 times a week.  Behavioral modification strategies: increasing lean  protein intake and meal planning and cooking strategies.  [name] has agreed to follow-up with our clinic in 3 weeks.  Objective:   Blood pressure 125/78, pulse 72, temperature 98.3 F (36.8 C), height '5\' 7"'$  (1.702 m), weight 204 lb (92.5 kg), SpO2 98 %. Body mass index is 31.95 kg/m.  General: Cooperative, alert, well developed, in no acute distress. HEENT: Conjunctivae and lids unremarkable. Cardiovascular: Regular rhythm.  Lungs: Normal work of breathing. Neurologic: No focal deficits.   Lab Results  Component Value Date   CREATININE 0.68 11/26/2013   BUN 13 11/26/2013   NA 139 11/26/2013   K 3.8 11/26/2013   CL 100 11/26/2013   CO2 24 11/26/2013   Lab Results  Component Value Date   ALT 14 07/27/2012   AST 18 07/27/2012   ALKPHOS 62 07/27/2012   BILITOT 0.3 07/27/2012   Lab Results  Component Value Date   HGBA1C 6.2 (H) 05/12/2020   Lab Results  Component Value Date   INSULIN 12.4 05/12/2020   No results found for: TSH No results found for: CHOL, HDL, LDLCALC, LDLDIRECT, TRIG, CHOLHDL Lab Results  Component Value Date   VD25OH 40.8 05/12/2020   Lab Results  Component Value Date   WBC 4.4 11/26/2013   HGB 12.3 11/26/2013   HCT 36.8 11/26/2013   MCV 93.6 11/26/2013   PLT 313 11/26/2013   No results found for: IRON, TIBC, FERRITIN  Attestation Statements:   Reviewed by clinician on day of visit: allergies,  medications, problem list, medical history, surgical history, family history, social history, and previous encounter notes.  I, Lizbeth Bark, RMA, am acting as Location manager for [employer], [place].   I have reviewed the above documentation for accuracy and completeness, and I agree with the above. -  Georgianne Fick, FNP

## 2020-10-23 ENCOUNTER — Other Ambulatory Visit (HOSPITAL_COMMUNITY): Payer: Self-pay

## 2020-10-23 DIAGNOSIS — R059 Cough, unspecified: Secondary | ICD-10-CM | POA: Diagnosis not present

## 2020-10-23 DIAGNOSIS — J019 Acute sinusitis, unspecified: Secondary | ICD-10-CM | POA: Diagnosis not present

## 2020-10-23 DIAGNOSIS — Z6832 Body mass index (BMI) 32.0-32.9, adult: Secondary | ICD-10-CM | POA: Diagnosis not present

## 2020-10-23 MED ORDER — AMOXICILLIN-POT CLAVULANATE 875-125 MG PO TABS
ORAL_TABLET | ORAL | 0 refills | Status: DC
  Filled 2020-10-23: qty 14, 7d supply, fill #0

## 2020-10-23 MED ORDER — PROMETHAZINE-DM 6.25-15 MG/5ML PO SYRP
ORAL_SOLUTION | ORAL | 0 refills | Status: DC
  Filled 2020-10-23: qty 140, 7d supply, fill #0

## 2020-10-26 ENCOUNTER — Ambulatory Visit (INDEPENDENT_AMBULATORY_CARE_PROVIDER_SITE_OTHER): Payer: 59 | Admitting: Family Medicine

## 2020-10-26 ENCOUNTER — Other Ambulatory Visit (HOSPITAL_COMMUNITY): Payer: Self-pay

## 2020-10-26 ENCOUNTER — Other Ambulatory Visit: Payer: Self-pay

## 2020-10-26 ENCOUNTER — Encounter (INDEPENDENT_AMBULATORY_CARE_PROVIDER_SITE_OTHER): Payer: Self-pay | Admitting: Family Medicine

## 2020-10-26 VITALS — BP 102/68 | HR 92 | Temp 98.7°F | Ht 67.0 in | Wt 200.0 lb

## 2020-10-26 DIAGNOSIS — K219 Gastro-esophageal reflux disease without esophagitis: Secondary | ICD-10-CM | POA: Diagnosis not present

## 2020-10-26 DIAGNOSIS — E6609 Other obesity due to excess calories: Secondary | ICD-10-CM | POA: Diagnosis not present

## 2020-10-26 DIAGNOSIS — R7303 Prediabetes: Secondary | ICD-10-CM | POA: Diagnosis not present

## 2020-10-26 DIAGNOSIS — Z6833 Body mass index (BMI) 33.0-33.9, adult: Secondary | ICD-10-CM

## 2020-10-26 MED ORDER — WEGOVY 1.7 MG/0.75ML ~~LOC~~ SOAJ
1.7000 mg | SUBCUTANEOUS | 0 refills | Status: DC
  Filled 2020-10-26 – 2020-10-27 (×2): qty 3, 28d supply, fill #0

## 2020-10-27 ENCOUNTER — Other Ambulatory Visit (HOSPITAL_COMMUNITY): Payer: Self-pay

## 2020-10-27 NOTE — Progress Notes (Signed)
Chief Complaint:   OBESITY Kimberly Boyd is here to discuss her progress with her obesity treatment plan along with follow-up of her obesity related diagnoses. Kimberly Boyd is on the Category 2 Plan and states she is following her eating plan approximately 50% of the time. Kimberly Boyd states she is doing full body workout 20 minutes 4 times per week.  Today's visit was #: 9 Starting weight: 213 lbs Starting date: 05/12/2020 Today's weight: 200 lbs Today's date: 10/26/2020 Total lbs lost to date: 13 lbs Total lbs lost since last in-office visit: 4 lbs  Interim History: Kimberly Boyd has been ill with an URI and has not been able to adhere to the plan. However, she is now feeling better.  Subjective:   1. Pre-diabetes Kimberly Boyd is on 1.7 mg of Wegovy. She denies nausea. She notes constipation.  Lab Results  Component Value Date   HGBA1C 6.2 (H) 05/12/2020   Lab Results  Component Value Date   INSULIN 12.4 05/12/2020    2. Gastroesophageal reflux disease without esophagitis Kimberly Boyd has noted increased GERD with [id number]. She takes Prilosec as needed.  Assessment/Plan:   1. Pre-diabetes Kimberly Boyd will continue to work on weight loss, exercise, and decreasing simple carbohydrates to help decrease the risk of diabetes. We will refill Wegovy 1.7 mg weekly. She will take Miralax PRN for constipation.  - Semaglutide-Weight Management (WEGOVY) 1.7 MG/0.75ML SOAJ; Inject 1.7 mg into the skin once a week.  Dispense: 3 mL; Refill: 0  2. Gastroesophageal reflux disease without esophagitis  Kimberly Boyd will continue Prilosec PRN. Avoid eating close to bedtime.     3. Obesity: BMI 31.32 Kimberly Boyd is currently in the action stage of change. As such, her goal is to continue with weight loss efforts. She has agreed to the Category 2 Plan.   Exercise goals:  As is.  Behavioral modification strategies: increasing lean protein intake and decreasing simple carbohydrates.  Kimberly Boyd has agreed to follow-up with our clinic in  3-4 weeks (fasting).  Objective:   Blood pressure 102/68, pulse 92, temperature 98.7 F (37.1 C), height '5\' 7"'$  (1.702 m), weight 200 lb (90.7 kg), SpO2 96 %. Body mass index is 31.32 kg/m.  General: Cooperative, alert, well developed, in no acute distress. HEENT: Conjunctivae and lids unremarkable. Cardiovascular: Regular rhythm.  Lungs: Normal work of breathing. Neurologic: No focal deficits.   Lab Results  Component Value Date   CREATININE 0.68 11/26/2013   BUN 13 11/26/2013   NA 139 11/26/2013   K 3.8 11/26/2013   CL 100 11/26/2013   CO2 24 11/26/2013   Lab Results  Component Value Date   ALT 14 07/27/2012   AST 18 07/27/2012   ALKPHOS 62 07/27/2012   BILITOT 0.3 07/27/2012   Lab Results  Component Value Date   HGBA1C 6.2 (H) 05/12/2020   Lab Results  Component Value Date   INSULIN 12.4 05/12/2020   No results found for: TSH No results found for: CHOL, HDL, LDLCALC, LDLDIRECT, TRIG, CHOLHDL Lab Results  Component Value Date   VD25OH 40.8 05/12/2020   Lab Results  Component Value Date   WBC 4.4 11/26/2013   HGB 12.3 11/26/2013   HCT 36.8 11/26/2013   MCV 93.6 11/26/2013   PLT 313 11/26/2013   No results found for: IRON, TIBC, FERRITIN  Attestation Statements:   Reviewed by clinician on day of visit: allergies, medications, problem list, medical history, surgical history, family history, social history, and previous encounter notes.  I, Lizbeth Bark, RMA, am acting as  transcriptionist for Jake Bathe, [place].   I have reviewed the above documentation for accuracy and completeness, and I agree with the above. -  Georgianne Fick, FNP

## 2020-11-02 ENCOUNTER — Other Ambulatory Visit (HOSPITAL_COMMUNITY): Payer: Self-pay

## 2020-11-04 ENCOUNTER — Other Ambulatory Visit (HOSPITAL_COMMUNITY): Payer: Self-pay

## 2020-11-04 DIAGNOSIS — N951 Menopausal and female climacteric states: Secondary | ICD-10-CM | POA: Diagnosis not present

## 2020-11-04 DIAGNOSIS — R232 Flushing: Secondary | ICD-10-CM | POA: Diagnosis not present

## 2020-11-09 ENCOUNTER — Encounter (INDEPENDENT_AMBULATORY_CARE_PROVIDER_SITE_OTHER): Payer: Self-pay

## 2020-11-24 ENCOUNTER — Encounter (INDEPENDENT_AMBULATORY_CARE_PROVIDER_SITE_OTHER): Payer: Self-pay | Admitting: Family Medicine

## 2020-11-24 ENCOUNTER — Ambulatory Visit (INDEPENDENT_AMBULATORY_CARE_PROVIDER_SITE_OTHER): Payer: 59 | Admitting: Family Medicine

## 2020-11-24 ENCOUNTER — Other Ambulatory Visit: Payer: Self-pay

## 2020-11-24 VITALS — BP 110/74 | HR 74 | Temp 98.1°F | Ht 67.0 in | Wt 197.0 lb

## 2020-11-24 DIAGNOSIS — E669 Obesity, unspecified: Secondary | ICD-10-CM

## 2020-11-24 DIAGNOSIS — Z0000 Encounter for general adult medical examination without abnormal findings: Secondary | ICD-10-CM | POA: Diagnosis not present

## 2020-11-24 DIAGNOSIS — Z6833 Body mass index (BMI) 33.0-33.9, adult: Secondary | ICD-10-CM

## 2020-11-24 DIAGNOSIS — R7303 Prediabetes: Secondary | ICD-10-CM | POA: Diagnosis not present

## 2020-11-24 DIAGNOSIS — E559 Vitamin D deficiency, unspecified: Secondary | ICD-10-CM | POA: Diagnosis not present

## 2020-11-24 DIAGNOSIS — K219 Gastro-esophageal reflux disease without esophagitis: Secondary | ICD-10-CM | POA: Diagnosis not present

## 2020-11-24 DIAGNOSIS — R140 Abdominal distension (gaseous): Secondary | ICD-10-CM | POA: Diagnosis not present

## 2020-11-24 DIAGNOSIS — R194 Change in bowel habit: Secondary | ICD-10-CM | POA: Diagnosis not present

## 2020-11-24 MED ORDER — VITAMIN D 50 MCG (2000 UT) PO CAPS: 1.0000 | ORAL_CAPSULE | Freq: Every day | ORAL | 0 refills | Status: DC

## 2020-11-24 NOTE — Progress Notes (Signed)
Chief Complaint:   OBESITY Kimberly Boyd is here to discuss her progress with her obesity treatment plan along with follow-up of her obesity related diagnoses. Kimberly Boyd is on the Category 2 Plan and states she is following her eating plan approximately 50% of the time. Kimberly Boyd states she is doing HIIT bike and stretching for 40 minutes 4 times per week.  Today's visit was #: 10 Starting weight: 213 lbs Starting date: 05/12/2020 Today's weight: 197 lbs Today's date: 11/24/2020 Total lbs lost to date: 16 lbs Total lbs lost since last in-office visit: 3 lbs  Interim History: Kimberly Boyd is off Wegovy due to GI side effects. She has been on [employer] due to GI issues.She is down 3 lbs today partially due to side effects from [facility].. She is trying to get in adequate protein. She will be having right total knee replacement in October.   Subjective:   1. Pre-diabetes Breta had to stop Wegovy due to loose stools, abdominal cramping and fatigue.   Lab Results  Component Value Date   HGBA1C 6.2 (H) 05/12/2020   Lab Results  Component Value Date   INSULIN 12.4 05/12/2020    2. Vitamin D insufficiency Kimberly Boyd's Vitamin D is nearly at goal (40.8). She is on Vitmain D 2000 IU. She recently started back on this.   Lab Results  Component Value Date   VD25OH 40.8 05/12/2020    Assessment/Plan:   1. Pre-diabetes  Jennyfer will continue to work on weight loss, exercise, and decreasing simple carbohydrates to help decrease the risk of diabetes. We will check labs today. She will continue meal plan.  - Comprehensive metabolic panel - Hemoglobin A1c - Insulin, rando  2. Vitamin D insufficiency  Kimberly Boyd agrees to continue to take OTC Vitamin D 2,000 IU  and she will follow-up for routine testing of Vitamin D, at least 2-3 times per year to avoid over-replacement.  - VITAMIN D 25 Hydroxy (Vit-D Deficiency, Fractures) - Cholecalciferol (VITAMIN D) 50 MCG (2000 UT) CAPS; Take 1 capsule (2,000 Units  total) by mouth daily.  Dispense: 30 capsule; Refill: 0  3. Obesity: Current BMI 30.85 Kimberly Boyd is currently in the action stage of change. As such, her goal is to continue with weight loss efforts. She has agreed to practicing portion control and making smarter food choices, such as increasing vegetables and decreasing simple carbohydrates. She will concentrate on protein intake.  Exercise goals:  As is.  Behavioral modification strategies: increasing lean protein intake.  Kimberly Boyd has agreed to follow-up with our clinic in 3-4 weeks.  Kimberly Boyd was informed we would discuss her lab results at her next visit unless there is a critical issue that needs to be addressed sooner. Kimberly Boyd agreed to keep her next visit at the agreed upon time to discuss these results.  Objective:   Blood pressure 110/74, pulse 74, temperature 98.1 F (36.7 C), height '5\' 7"'$  (1.702 m), weight 197 lb (89.4 kg), SpO2 97 %. Body mass index is 30.85 kg/m.  General: Cooperative, alert, well developed, in no acute distress. HEENT: Conjunctivae and lids unremarkable. Cardiovascular: Regular rhythm.  Lungs: Normal work of breathing. Neurologic: No focal deficits.   Lab Results  Component Value Date   CREATININE 0.68 11/26/2013   BUN 13 11/26/2013   NA 139 11/26/2013   K 3.8 11/26/2013   CL 100 11/26/2013   CO2 24 11/26/2013   Lab Results  Component Value Date   ALT 14 07/27/2012   AST 18 07/27/2012  ALKPHOS 62 07/27/2012   BILITOT 0.3 07/27/2012   Lab Results  Component Value Date   HGBA1C 6.2 (H) 05/12/2020   Lab Results  Component Value Date   INSULIN 12.4 05/12/2020   No results found for: TSH No results found for: CHOL, HDL, LDLCALC, LDLDIRECT, TRIG, CHOLHDL Lab Results  Component Value Date   VD25OH 40.8 05/12/2020   Lab Results  Component Value Date   WBC 4.4 11/26/2013   HGB 12.3 11/26/2013   HCT 36.8 11/26/2013   MCV 93.6 11/26/2013   PLT 313 11/26/2013   No results found for: IRON,  TIBC, FERRITIN  Attestation Statements:   Reviewed by clinician on day of visit: allergies, medications, problem list, medical history, surgical history, family history, social history, and previous encounter notes.  I, Kimberly Boyd, RMA, am acting as Location manager for [employer], [place].   I have reviewed the above documentation for accuracy and completeness, and I agree with the above. -  Kimberly Fick, FNP

## 2020-11-25 ENCOUNTER — Encounter (INDEPENDENT_AMBULATORY_CARE_PROVIDER_SITE_OTHER): Payer: Self-pay | Admitting: Family Medicine

## 2020-11-25 LAB — INSULIN, RANDOM: INSULIN: 7.4 u[IU]/mL (ref 2.6–24.9)

## 2020-11-25 LAB — COMPREHENSIVE METABOLIC PANEL
ALT: 32 IU/L (ref 0–32)
AST: 22 IU/L (ref 0–40)
Albumin/Globulin Ratio: 1.2 (ref 1.2–2.2)
Albumin: 4.4 g/dL (ref 3.8–4.9)
Alkaline Phosphatase: 83 IU/L (ref 44–121)
BUN/Creatinine Ratio: 15 (ref 9–23)
BUN: 12 mg/dL (ref 6–24)
Bilirubin Total: 0.3 mg/dL (ref 0.0–1.2)
CO2: 28 mmol/L (ref 20–29)
Calcium: 10 mg/dL (ref 8.7–10.2)
Chloride: 98 mmol/L (ref 96–106)
Creatinine, Ser: 0.79 mg/dL (ref 0.57–1.00)
Globulin, Total: 3.6 g/dL (ref 1.5–4.5)
Glucose: 84 mg/dL (ref 65–99)
Potassium: 3.8 mmol/L (ref 3.5–5.2)
Sodium: 141 mmol/L (ref 134–144)
Total Protein: 8 g/dL (ref 6.0–8.5)
eGFR: 89 mL/min/{1.73_m2} (ref >59)

## 2020-11-25 LAB — LIPID PANEL WITH LDL/HDL RATIO
Cholesterol, Total: 180 mg/dL (ref 100–199)
HDL: 59 mg/dL (ref >39)
LDL Chol Calc (NIH): 110 mg/dL — ABNORMAL HIGH (ref 0–99)
LDL/HDL Ratio: 1.9 ratio (ref 0.0–3.2)
Triglycerides: 57 mg/dL (ref 0–149)
VLDL Cholesterol Cal: 11 mg/dL (ref 5–40)

## 2020-11-25 LAB — HEMOGLOBIN A1C
Est. average glucose Bld gHb Est-mCnc: 123 mg/dL
Hgb A1c MFr Bld: 5.9 % — ABNORMAL HIGH (ref 4.8–5.6)

## 2020-11-25 LAB — VITAMIN D 25 HYDROXY (VIT D DEFICIENCY, FRACTURES): Vit D, 25-Hydroxy: 36.3 ng/mL (ref 30.0–100.0)

## 2020-12-09 ENCOUNTER — Other Ambulatory Visit (HOSPITAL_COMMUNITY): Payer: Self-pay

## 2020-12-10 ENCOUNTER — Other Ambulatory Visit (HOSPITAL_COMMUNITY): Payer: Self-pay

## 2020-12-10 MED ORDER — TRIAMTERENE-HCTZ 37.5-25 MG PO TABS
ORAL_TABLET | ORAL | 0 refills | Status: DC
  Filled 2020-12-10: qty 90, 90d supply, fill #0

## 2020-12-11 DIAGNOSIS — Z23 Encounter for immunization: Secondary | ICD-10-CM | POA: Diagnosis not present

## 2020-12-11 DIAGNOSIS — M1711 Unilateral primary osteoarthritis, right knee: Secondary | ICD-10-CM | POA: Diagnosis not present

## 2020-12-11 DIAGNOSIS — Z8739 Personal history of other diseases of the musculoskeletal system and connective tissue: Secondary | ICD-10-CM | POA: Diagnosis not present

## 2020-12-11 DIAGNOSIS — E559 Vitamin D deficiency, unspecified: Secondary | ICD-10-CM | POA: Diagnosis not present

## 2020-12-11 DIAGNOSIS — R002 Palpitations: Secondary | ICD-10-CM | POA: Diagnosis not present

## 2020-12-11 DIAGNOSIS — Z01818 Encounter for other preprocedural examination: Secondary | ICD-10-CM | POA: Diagnosis not present

## 2020-12-11 DIAGNOSIS — E669 Obesity, unspecified: Secondary | ICD-10-CM | POA: Diagnosis not present

## 2020-12-11 DIAGNOSIS — G4700 Insomnia, unspecified: Secondary | ICD-10-CM | POA: Diagnosis not present

## 2020-12-11 DIAGNOSIS — R739 Hyperglycemia, unspecified: Secondary | ICD-10-CM | POA: Diagnosis not present

## 2020-12-11 DIAGNOSIS — M792 Neuralgia and neuritis, unspecified: Secondary | ICD-10-CM | POA: Diagnosis not present

## 2020-12-11 DIAGNOSIS — I10 Essential (primary) hypertension: Secondary | ICD-10-CM | POA: Diagnosis not present

## 2020-12-14 ENCOUNTER — Telehealth: Payer: Self-pay | Admitting: Internal Medicine

## 2020-12-14 NOTE — Telephone Encounter (Signed)
Pt is scheduled to see Dr. Caryl Comes, 01/05/2021, and clearance will be addressed at that time. Will route back to the requesting surgeon's office back to make them aware.

## 2020-12-14 NOTE — Telephone Encounter (Signed)
   [facility] Pre-operative Risk Assessment    Request for surgical clearance:  What type of surgery is being performed? Right knee replacement    When is this surgery scheduled? Oct 27th   What type of clearance is required (medical clearance vs. Pharmacy clearance to hold med vs. Both)? both  Are there any medications that need to be held prior to surgery and how long? TBD   Practice name and name of physician performing surgery? Dr. Rhona Raider   What is your office phone number [phone]    7.   What is your office fax number [phone]  8.   Anesthesia type (None, local, MAC, general) ? spinal   Milbert Coulter 12/14/2020, 12:31 PM  _________________________________________________________________   (provider comments below)

## 2020-12-14 NOTE — Telephone Encounter (Signed)
Primary Cardiologist:Steven Caryl Comes, MD  Chart reviewed as part of pre-operative protocol coverage. Because of Kimberly Boyd's past medical history and time since last visit, he/she will require a follow-up visit in order to better assess preoperative cardiovascular risk.  Pre-op covering staff: - Please schedule appointment and call patient to inform them. - Please contact requesting surgeon's office via preferred method (i.e, phone, fax) to inform them of need for appointment prior to surgery.  If applicable, this message will also be routed to pharmacy pool and/or primary cardiologist for input on holding anticoagulant/antiplatelet agent as requested below so that this information is available at time of patient's appointment.   [name], NP  12/14/2020, 12:44 PM

## 2020-12-21 DIAGNOSIS — M1711 Unilateral primary osteoarthritis, right knee: Secondary | ICD-10-CM | POA: Diagnosis not present

## 2020-12-22 ENCOUNTER — Encounter (INDEPENDENT_AMBULATORY_CARE_PROVIDER_SITE_OTHER): Payer: Self-pay | Admitting: Family Medicine

## 2020-12-22 ENCOUNTER — Ambulatory Visit (INDEPENDENT_AMBULATORY_CARE_PROVIDER_SITE_OTHER): Payer: 59 | Admitting: Family Medicine

## 2020-12-22 ENCOUNTER — Other Ambulatory Visit: Payer: Self-pay

## 2020-12-22 VITALS — BP 109/72 | HR 71 | Temp 98.1°F | Ht 67.0 in | Wt 196.0 lb

## 2020-12-22 DIAGNOSIS — E559 Vitamin D deficiency, unspecified: Secondary | ICD-10-CM | POA: Diagnosis not present

## 2020-12-22 DIAGNOSIS — Z6833 Body mass index (BMI) 33.0-33.9, adult: Secondary | ICD-10-CM | POA: Diagnosis not present

## 2020-12-22 DIAGNOSIS — E7849 Other hyperlipidemia: Secondary | ICD-10-CM | POA: Insufficient documentation

## 2020-12-22 DIAGNOSIS — R7303 Prediabetes: Secondary | ICD-10-CM

## 2020-12-22 DIAGNOSIS — E669 Obesity, unspecified: Secondary | ICD-10-CM | POA: Diagnosis not present

## 2020-12-22 MED ORDER — CHOLECALCIFEROL 100 MCG (4000 UT) PO CAPS: 1.0000 | ORAL_CAPSULE | Freq: Every day | ORAL | Status: AC

## 2020-12-22 NOTE — Progress Notes (Signed)
The 10-year ASCVD risk score (Arnett DK, et al., 2019) is: 2.2%   Values used to calculate the score:     Age: 55 years     Sex: Female     Is Non-Hispanic African American: Yes     Diabetic: No     Tobacco smoker: No     Systolic Blood Pressure: 836 mmHg     Is BP treated: Yes     HDL Cholesterol: 59 mg/dL     Total Cholesterol: 180 mg/dL

## 2020-12-22 NOTE — Progress Notes (Signed)
Chief Complaint:   OBESITY Kimberly Boyd is here to discuss her progress with her obesity treatment plan along with follow-up of her obesity related diagnoses. Kimberly Boyd is on the Category 2 Plan and practicing portion control and making smarter food choices, such as increasing vegetables and decreasing simple carbohydrates and states she is following her eating plan approximately 40% of the time. Kimberly Boyd states she is doing gym exercise, aerobics, stretching and weights for 45 minutes 3 times per week.  Today's visit was #: 11 Starting weight: 213 lbs Starting date: 05/12/2020 Today's weight: 196 lbs Today's date: 12/22/2020 Total lbs lost to date: 14 lbs Total lbs lost since last in-office visit: 1 lb  Interim History: Kimberly Boyd is having total knee replacement Oct. 27th. She is working on increasing her protein. She does not have protein at all meals.  We had to stop GLP-1 due to side effects.  Subjective:   1. Other hyperlipidemia Kimberly Boyd's last LDL was elevated at 110. Her Triglycerides and HDL was within normal limits. Her 10 year ASCVD risk score is 2.2 %. She is not on statin currently.  Lab Results  Component Value Date   CHOL 180 11/24/2020   HDL 59 11/24/2020   LDLCALC 110 (H) 11/24/2020   TRIG 57 11/24/2020   Lab Results  Component Value Date   ALT 32 11/24/2020   AST 22 11/24/2020   ALKPHOS 83 11/24/2020   BILITOT 0.3 11/24/2020   The 10-year ASCVD risk score (Arnett DK, et al., 2019) is: 2.2%   Values used to calculate the score:     Age: 48 years     Sex: Female     Is Non-Hispanic African American: Yes     Diabetic: No     Tobacco smoker: No     Systolic Blood Pressure: 976 mmHg     Is BP treated: Yes     HDL Cholesterol: 59 mg/dL     Total Cholesterol: 180 mg/dL   2. Vitamin D insufficiency Kimberly Boyd's Vitamin D has decreased to 36 from 40 on 2000 IU D3 daily.   Lab Results  Component Value Date   VD25OH 36.3 11/24/2020   VD25OH 40.8 05/12/2020    3.  Pre-diabetes Kimberly Boyd's A1C was decreased to 5.9 from 6.2. She was on Semaglutide during most of last 3 months.   Lab Results  Component Value Date   HGBA1C 5.9 (H) 11/24/2020   Lab Results  Component Value Date   INSULIN 7.4 11/24/2020   INSULIN 12.4 05/12/2020    Assessment/Plan:   1. Other hyperlipidemia No statin indicated.   Patryce agrees to increase dose of Vitamin D 4,000 IU daily and she will follow-up for routine testing of Vitamin D, at least 2-3 times per year to avoid over-replacement.  - Cholecalciferol 100 MCG (4000 UT) CAPS; Take 1 capsule (4,000 Units total) by mouth daily.  Dispense: 30 capsule  3. Pre-diabetes Kimberly Boyd will continue to work on weight loss, exercise, and decreasing simple carbohydrates to help decrease the risk of diabetes. She will continue with meal plan.   4. Obesity: Current BMI 30.85 Kimberly Boyd is currently in the action stage of change. As such, her goal is to continue with weight loss efforts. She has agreed to practicing portion control and making smarter food choices, such as increasing vegetables and decreasing simple carbohydrates 80 grams of protein daily.  We discussed ways to get protein in during recovering from surgery.  Exercise goals:  As is.  Behavioral modification strategies:  increasing lean protein intake.  Kimberly Boyd has agreed to follow-up with our clinic in 6 weeks (virtual) because she will be recovering from TKR.   Objective:   Blood pressure 109/72, pulse 71, temperature 98.1 F (36.7 C), height 5\' 7"  (1.702 m), weight 196 lb (88.9 kg), SpO2 98 %. Body mass index is 30.7 kg/m.  General: Cooperative, alert, well developed, in no acute distress. HEENT: Conjunctivae and lids unremarkable. Cardiovascular: Regular rhythm.  Lungs: Normal work of breathing. Neurologic: No focal deficits.   Lab Results  Component Value Date   CREATININE 0.79 11/24/2020   BUN 12 11/24/2020   NA 141 11/24/2020   K 3.8 11/24/2020   CL 98  11/24/2020   CO2 28 11/24/2020   Lab Results  Component Value Date   ALT 32 11/24/2020   AST 22 11/24/2020   ALKPHOS 83 11/24/2020   BILITOT 0.3 11/24/2020   Lab Results  Component Value Date   HGBA1C 5.9 (H) 11/24/2020   HGBA1C 6.2 (H) 05/12/2020   Lab Results  Component Value Date   INSULIN 7.4 11/24/2020   INSULIN 12.4 05/12/2020   No results found for: TSH Lab Results  Component Value Date   CHOL 180 11/24/2020   HDL 59 11/24/2020   LDLCALC 110 (H) 11/24/2020   TRIG 57 11/24/2020   Lab Results  Component Value Date   VD25OH 36.3 11/24/2020   VD25OH 40.8 05/12/2020   Lab Results  Component Value Date   WBC 4.4 11/26/2013   HGB 12.3 11/26/2013   HCT 36.8 11/26/2013   MCV 93.6 11/26/2013   PLT 313 11/26/2013   No results found for: IRON, TIBC, FERRITIN  Attestation Statements:   Reviewed by clinician on day of visit: allergies, medications, problem list, medical history, surgical history, family history, social history, and previous encounter notes.  Time spent on visit including pre-visit chart review and post-visit care and charting was 35 minutes.   I, Lizbeth Bark, RMA, am acting as Location manager for [employer], [place].   I have reviewed the above documentation for accuracy and completeness, and I agree with the above. -  Georgianne Fick, FNP

## 2020-12-28 ENCOUNTER — Other Ambulatory Visit (HOSPITAL_COMMUNITY): Payer: Self-pay

## 2020-12-28 MED ORDER — PROPRANOLOL HCL ER 80 MG PO CP24
80.0000 mg | ORAL_CAPSULE | Freq: Every day | ORAL | 0 refills | Status: DC
  Filled 2020-12-28: qty 90, 90d supply, fill #0

## 2020-12-29 ENCOUNTER — Encounter (INDEPENDENT_AMBULATORY_CARE_PROVIDER_SITE_OTHER): Payer: Self-pay | Admitting: Family Medicine

## 2020-12-29 DIAGNOSIS — M79601 Pain in right arm: Secondary | ICD-10-CM | POA: Diagnosis not present

## 2020-12-29 DIAGNOSIS — Z01812 Encounter for preprocedural laboratory examination: Secondary | ICD-10-CM | POA: Diagnosis not present

## 2020-12-29 DIAGNOSIS — I471 Supraventricular tachycardia: Secondary | ICD-10-CM | POA: Diagnosis not present

## 2020-12-29 NOTE — Telephone Encounter (Signed)
Please advise 

## 2020-12-30 ENCOUNTER — Other Ambulatory Visit (HOSPITAL_COMMUNITY): Payer: Self-pay

## 2020-12-30 DIAGNOSIS — M1711 Unilateral primary osteoarthritis, right knee: Secondary | ICD-10-CM | POA: Diagnosis not present

## 2020-12-30 MED ORDER — HYDROCODONE-ACETAMINOPHEN 5-325 MG PO TABS
ORAL_TABLET | ORAL | 0 refills | Status: DC
  Filled 2020-12-30: qty 40, 5d supply, fill #0

## 2020-12-30 MED ORDER — TIZANIDINE HCL 4 MG PO TABS
ORAL_TABLET | ORAL | 0 refills | Status: DC
  Filled 2020-12-30: qty 40, 10d supply, fill #0

## 2021-01-05 ENCOUNTER — Ambulatory Visit: Payer: 59 | Admitting: Internal Medicine

## 2021-01-05 ENCOUNTER — Other Ambulatory Visit: Payer: Self-pay

## 2021-01-05 ENCOUNTER — Encounter: Payer: Self-pay | Admitting: Internal Medicine

## 2021-01-05 VITALS — BP 110/80 | HR 65 | Ht 67.0 in | Wt 202.0 lb

## 2021-01-05 DIAGNOSIS — I471 Supraventricular tachycardia, unspecified: Secondary | ICD-10-CM

## 2021-01-05 DIAGNOSIS — R002 Palpitations: Secondary | ICD-10-CM

## 2021-01-05 NOTE — Patient Instructions (Signed)

## 2021-01-05 NOTE — Progress Notes (Signed)
ELECTROPHYSIOLOGY OFFICE NOTE  Patient ID: Kimberly Boyd, MRN: [id number], DOB/AGE: 1965/10/23 55 y.o. Admit date: (Not on file) Date of Consult: 01/05/2021  Primary Physician: Vernie Shanks, MD Primary Cardiologist: new      HPI Kimberly Boyd is a 55 y.o. female seen in follow-up for palpitations with a history of   adenosine sensitive SVT   The other irregular palpitations, typically lasting minutes but sometimes up to an hour if there is any information or will be in Care Everywhere from her prior SVT.  Described as irregular.  Seen today also for preoperative assessment  Her AliveCor monitor which identified PAC and PVCs were reviewed today  The patient denies chest pain, shortness of breath, nocturnal dyspnea, orthopnea or peripheral edema.  There have been no palpitations, lightheadedness or syncope.    Great deal of stress.  Recently evaluated at the [facility] weight loss clinic.         Past Medical History:  Diagnosis Date   Arthritis    Arthritis of left foot    Arthritis of right knee    Back pain years.    chronic low back pain    Chronic back pain    Complication of anesthesia    tachycardia, difficulty waking up   GERD (gastroesophageal reflux disease)    Heart murmur    questionable   Hypertension    Obesity    Palpitations       Surgical History:  Past Surgical History:  Procedure Laterality Date   BREAST BIOPSY Right    Benign approx age 88   cervical dysplasia laser     dermoid cyst removal     IR GENERIC HISTORICAL  06/04/2014   IR RADIOLOGIST EVAL & MGMT 06/04/2014 Sandi Mariscal, MD [facility] INTERV RAD   LAPAROSCOPIC CHOLECYSTECTOMY     LIPOMA RESECTION     and redone   ORIF ANKLE FRACTURE  08/09/2011   Procedure: OPEN REDUCTION INTERNAL FIXATION (ORIF) ANKLE FRACTURE;  Surgeon: Wylene Simmer, MD;  Location: [place];  Service: Orthopedics;  Laterality: Right;  ORIF Right Ankle Lateral Malleolus   scar revision for the lipoma       Home  Meds: Current Meds  Medication Sig   ALPRAZolam (XANAX) 0.5 MG tablet Take 0.5 mg by mouth as needed for anxiety.   Cholecalciferol 100 MCG (4000 UT) CAPS Take 1 capsule (4,000 Units total) by mouth daily.   diclofenac (VOLTAREN) 75 MG EC tablet Take 1 tablet (75 mg total) by mouth 2 (two) times daily.   ELDERBERRY PO Take by mouth.   fluticasone (FLONASE) 50 MCG/ACT nasal spray PLACE 2 SPRAYS IN EACH NOSTRIL DAILY   magnesium 30 MG tablet Take 250 mg by mouth 2 (two) times daily.   omeprazole (PRILOSEC) 40 MG capsule    propranolol ER (INDERAL LA) 80 MG 24 hr capsule Take 1 capsule (80 mg total) by mouth daily.   triamterene-hydrochlorothiazide (MAXZIDE-25) 37.5-25 MG tablet TAKE 1 TABLET BY MOUTH EVERY MORNING   zinc sulfate 220 (50 Zn) MG capsule Take 220 mg by mouth daily.  Allergies:  Allergies  Allergen Reactions   Doxycycline Other (See Comments)    Esophagitis; opened up in my esophagus         ROS:  Please see the history of present illness.     All other systems reviewed and negative.   Physical Exam: BP 110/80 (BP Location: Left Arm, Patient Position: Sitting, Cuff Size: Normal)  Pulse 65   Ht 5\' 7"  (1.702 m)   Wt 202 lb (91.6 kg)   BMI 31.64 kg/m   Well developed and nourished in no acute distress HENT normal Neck supple with JVP-  flat  Clear Regular rate and rhythm, no murmurs or gallops Abd-soft with active BS No Clubbing cyanosis edema Skin-warm and dry A & Oriented  Grossly normal sensory and motor function  ECG sinus @ 6517/08/41    Assessment and Plan:  Palpitations>>PACs /PVCs bundle  SVT  Anxiety  Obesity  Preoperative assessment   Use of the AliveCor monitor demonstrated PACs and PVCs reassured her that these are benign.  Continue inderal la 80  Will stop ASA   Functional status is  good limited by knee .  She is cleared for her surgery.  Recommended RP   Virl Axe

## 2021-01-06 ENCOUNTER — Other Ambulatory Visit: Payer: Self-pay | Admitting: Orthopaedic Surgery

## 2021-01-06 DIAGNOSIS — Z01818 Encounter for other preprocedural examination: Secondary | ICD-10-CM

## 2021-01-07 ENCOUNTER — Other Ambulatory Visit (HOSPITAL_COMMUNITY): Payer: Self-pay

## 2021-01-11 ENCOUNTER — Ambulatory Visit: Payer: 59 | Admitting: Physical Therapy

## 2021-01-13 ENCOUNTER — Ambulatory Visit: Payer: 59 | Admitting: Physical Therapy

## 2021-01-18 ENCOUNTER — Encounter: Payer: 59 | Admitting: Physical Therapy

## 2021-01-22 ENCOUNTER — Encounter: Payer: 59 | Admitting: Physical Therapy

## 2021-01-25 ENCOUNTER — Other Ambulatory Visit (HOSPITAL_COMMUNITY): Payer: Self-pay

## 2021-01-25 MED ORDER — AMOXICILLIN-POT CLAVULANATE 875-125 MG PO TABS
ORAL_TABLET | ORAL | 0 refills | Status: DC
  Filled 2021-01-25: qty 14, 7d supply, fill #0

## 2021-01-26 ENCOUNTER — Other Ambulatory Visit (HOSPITAL_COMMUNITY): Payer: Self-pay

## 2021-01-26 ENCOUNTER — Ambulatory Visit (INDEPENDENT_AMBULATORY_CARE_PROVIDER_SITE_OTHER): Payer: 59 | Admitting: Family Medicine

## 2021-01-26 ENCOUNTER — Encounter (INDEPENDENT_AMBULATORY_CARE_PROVIDER_SITE_OTHER): Payer: Self-pay | Admitting: Family Medicine

## 2021-01-26 ENCOUNTER — Other Ambulatory Visit: Payer: Self-pay

## 2021-01-26 DIAGNOSIS — E669 Obesity, unspecified: Secondary | ICD-10-CM | POA: Diagnosis not present

## 2021-01-26 DIAGNOSIS — H1033 Unspecified acute conjunctivitis, bilateral: Secondary | ICD-10-CM | POA: Diagnosis not present

## 2021-01-26 DIAGNOSIS — R7303 Prediabetes: Secondary | ICD-10-CM | POA: Diagnosis not present

## 2021-01-26 DIAGNOSIS — Z6833 Body mass index (BMI) 33.0-33.9, adult: Secondary | ICD-10-CM

## 2021-01-26 MED ORDER — OFLOXACIN 0.3 % OP SOLN
2.0000 [drp] | Freq: Four times a day (QID) | OPHTHALMIC | 0 refills | Status: DC
  Filled 2021-01-26: qty 5, 5d supply, fill #0

## 2021-01-26 NOTE — Progress Notes (Signed)
TeleHealth Visit:  Due to the COVID-19 pandemic, this visit was completed with telemedicine (audio/video) technology to reduce patient and provider exposure as well as to preserve personal protective equipment.   Kimberly Boyd has verbally consented to this TeleHealth visit. The patient is located at home, the provider is located at the [facility] office. The participants in this visit include the listed provider and patient. The visit was conducted today via video.  Chief Complaint: OBESITY Kimberly Boyd is here to discuss her progress with her obesity treatment plan along with follow-up of her obesity related diagnoses. Kimberly Boyd is on practicing portion control and making smarter food choices, such as increasing vegetables and decreasing simple carbohydrates and states she is following her eating plan approximately 20% of the time. Kimberly Boyd states she is doing H.I.I.T. for 20 minutes 3 times per week.  Today's visit was #: 12 Starting weight: 213 lbs Starting date: 05/12/2020  Interim History: Kimberly Boyd feels she has gained weight. She is ill with a sinus infection and pinkeye. She has not been on plan over the past month. She notes polyphagia.  She will have right total knee replacement 02-23-2021.  Subjective:   1. Pre-diabetes Kimberly Boyd fatigue with FAOZHY. She feels it did help with polyphagia.  Lab Results  Component Value Date   HGBA1C 5.9 (H) 11/24/2020   Lab Results  Component Value Date   INSULIN 7.4 11/24/2020   INSULIN 12.4 05/12/2020    2. Acute conjunctivitis of both eyes, unspecified acute conjunctivitis type Kimberly Boyd has noted crusty eyes with purulent drainage the last few days. She is being treated with Augmentin for sinusitis.  Assessment/Plan:   1. Pre-diabetes Kimberly Boyd will continue with meal plan.  She will increase protein and decreasing simple carbohydrates to help decrease the risk of diabetes.    2. Acute conjunctivitis of both eyes, unspecified acute  conjunctivitis type Kimberly Boyd agrees to start Ocuflox 0.3% solution in both eyes 4 times daily for 7 days.   - ofloxacin (OCUFLOX) 0.3 % ophthalmic solution; Place 2 drops into both eyes 4 times daily for 7 days.  Dispense: 5 mL; Refill: 0  3. Obesity: Current BMI 30.69 Kimberly Boyd is currently in the action stage of change. As such, her goal is to continue with weight loss efforts. She has agreed to practicing portion control and making smarter food choices, such as increasing vegetables and decreasing simple carbohydrates.   Kimberly Boyd will have protein at least 3 times per day.  She will avoid going too long without eating.  Exercise goals:  As is.  Behavioral modification strategies: increasing lean protein intake and decreasing simple carbohydrates.  Kimberly Boyd has agreed to follow-up with our clinic in 7 weeks.  Objective:   VITALS: Per patient if applicable, see vitals. GENERAL: Alert and in no acute distress. CARDIOPULMONARY: No increased WOB. Speaking in clear sentences.  PSYCH: Pleasant and cooperative. Speech normal rate and rhythm. Affect is appropriate. Insight and judgement are appropriate. Attention is focused, linear, and appropriate.  NEURO: Oriented as arrived to appointment on time with no prompting.   Lab Results  Component Value Date   CREATININE 0.79 11/24/2020   BUN 12 11/24/2020   NA 141 11/24/2020   K 3.8 11/24/2020   CL 98 11/24/2020   CO2 28 11/24/2020   Lab Results  Component Value Date   ALT 32 11/24/2020   AST 22 11/24/2020   ALKPHOS 83 11/24/2020   BILITOT 0.3 11/24/2020   Lab Results  Component Value Date  HGBA1C 5.9 (H) 11/24/2020   HGBA1C 6.2 (H) 05/12/2020   Lab Results  Component Value Date   INSULIN 7.4 11/24/2020   INSULIN 12.4 05/12/2020   No results found for: TSH Lab Results  Component Value Date   CHOL 180 11/24/2020   HDL 59 11/24/2020   LDLCALC 110 (H) 11/24/2020   TRIG 57 11/24/2020   Lab Results  Component Value Date   VD25OH  36.3 11/24/2020   VD25OH 40.8 05/12/2020   Lab Results  Component Value Date   WBC 4.4 11/26/2013   HGB 12.3 11/26/2013   HCT 36.8 11/26/2013   MCV 93.6 11/26/2013   PLT 313 11/26/2013   No results found for: IRON, TIBC, FERRITIN  Attestation Statements:   Reviewed by clinician on day of visit: allergies, medications, problem list, medical history, surgical history, family history, social history, and previous encounter notes.   I, Lizbeth Bark, RMA, am acting as Location manager for [employer], [place].   I have reviewed the above documentation for accuracy and completeness, and I agree with the above. - Georgianne Fick, FNP

## 2021-01-27 ENCOUNTER — Encounter (INDEPENDENT_AMBULATORY_CARE_PROVIDER_SITE_OTHER): Payer: Self-pay | Admitting: Family Medicine

## 2021-01-27 DIAGNOSIS — G4489 Other headache syndrome: Secondary | ICD-10-CM | POA: Diagnosis not present

## 2021-01-27 DIAGNOSIS — R002 Palpitations: Secondary | ICD-10-CM | POA: Diagnosis not present

## 2021-01-28 ENCOUNTER — Encounter: Payer: Self-pay | Admitting: Internal Medicine

## 2021-01-28 ENCOUNTER — Telehealth: Payer: Self-pay | Admitting: Internal Medicine

## 2021-01-28 NOTE — Telephone Encounter (Signed)
Patient c/o Palpitations:  High priority if patient c/o lightheadedness, shortness of breath, or chest pain  How long have you had palpitations/irregular HR/ Afib? Are you having the symptoms now?  Patient states last night she had an episode of irregular HR. She states the episode lasted about 25 minutes and then she called EMS when she became lightheaded. She is no longer experiencing symptoms  Are you currently experiencing lightheadedness, SOB or CP?  Not currently but lightheaded during episode last night  Do you have a history of afib (atrial fibrillation) or irregular heart rhythm?  Yes, hx of palpitations   Have you checked your BP or HR? (document readings if available):   11/16 BP: 145/98  HR: 60-70            143/95  Are you experiencing any other symptoms?  Aside from lightheadedness last night, no additional symptoms  Patient states she has also been sick for the past 2 weeks

## 2021-01-28 NOTE — Telephone Encounter (Signed)
Spoke with pt who states she had an episode last night of palpitations or irregular heart beat.  She states this lasted for 20-25 minutes and ultimately became lightheaded so she called EMS who came out to evaluate.  Pt states EMS found no concerning rhythm so she did not go to ED.  She reports she has been sick for 2 weeks and started on Augmentin and Guaifenesin.  She denies current CP or additional symptoms today.  She states her BP and HR is normal for her today. Requested pt send her Kimberly Boyd mobile strips she recorded last night during event and will forward message to Dr Olin Pia PA as Dr Caryl Comes is out of the office until 02/08/2021.  Reviewed ED precautions.  Pt verbalizes understanding and agrees with current plan.

## 2021-01-29 ENCOUNTER — Encounter: Payer: 59 | Admitting: Physical Therapy

## 2021-02-01 NOTE — Telephone Encounter (Signed)
Spoke with pt and advised per Tommye Standard, PA-C:  4 tracings I received were reviewed.  SR with PACs and rare PVC.  One tracing SR alone without ectopy at all.  Looks like this is known for her.  Findings on tracing are benign and would not expect that these findings would provoke dizziness, or near syncope.  HRs were normal.  Would make no new recommendations based on these findings.   Pt verbalized understanding and agrees with current plan.  Will continue to monitor.

## 2021-02-03 ENCOUNTER — Encounter: Payer: 59 | Admitting: Physical Therapy

## 2021-02-08 NOTE — Progress Notes (Addendum)
COVID swab appointment: N/A  COVID Vaccine Completed:  Yes x2 Date COVID Vaccine completed: Has received booster:  Yes x1 COVID vaccine manufacturer: [employer]   Date of COVID positive in last 90 days:  No  PCP - Yaakov Guthrie, MD Cardiologist - Virl Axe, MD  Cardiac clearance in Epic dated 01/05/21 by Dr. Virl Axe  Chest x-ray - 02-12-21 Epic EKG - 01-05-21 Epic Stress Test - greater than 5 years ECHO - greater than 5 years Cardiac Cath - greater than 5 years Pacemaker/ICD device last checked: Spinal Cord Stimulator:  Sleep Study - No CPAP -   Fasting Blood Sugar - 80 to 90 Checks Blood Sugar - check occasionally  Blood Thinner Instructions:   Aspirin Instructions: ASA 81  Last Dose: stopped a week ago  Activity level:  Can go up a flight of stairs and perform activities of daily living without stopping and without symptoms of chest pain or shortness of breath.    Anesthesia review:  SVT, hx of palpitations, ?murmur.  Called 911 last week due to palpitaions, assessed but not taken to hospital.  Patient states that palpitations seem to be occurring more frequently (examined by Lyndon Code, PA-C)   Patient denies shortness of breath, fever, cough and chest pain at PAT appointment  Patient verbalized understanding of instructions that were given to them at the PAT appointment. Patient was also instructed that they will need to review over the PAT instructions again at home before surgery.

## 2021-02-08 NOTE — Patient Instructions (Addendum)
DUE TO COVID-19 ONLY ONE VISITOR IS ALLOWED TO COME WITH YOU AND STAY IN THE WAITING ROOM ONLY DURING PRE OP AND PROCEDURE.   **NO VISITORS ARE ALLOWED IN THE SHORT STAY AREA OR RECOVERY ROOM!!**   You are not required to quarantine, however you are required to wear a well-fitted mask when you are out and around people not in your household.  Hand Hygiene often Do NOT share personal items Notify your provider if you are in close contact with someone who has COVID or you develop fever 100.4 or greater, new onset of sneezing, cough, sore throat, shortness of breath or body aches.       Your procedure is scheduled on:  Tuesday, 02-23-21   Report to [facility] Main  Entrance     Report to admitting at 7:15 AM   Call this number if you have problems the morning of surgery [phone]   Do not eat food :After Midnight.   May have liquids until 7:00 AM day of surgery  CLEAR LIQUID DIET  Foods Allowed                                                                     Foods Excluded  Water, Black Coffee (no milk/no creamer) and tea, regular and decaf                              liquids that you cannot  Plain Jell-O in any flavor  (No red)                         see through such as: Fruit ices (not with fruit pulp)                                 milk, soups, orange juice  Iced Popsicles (No red)                                    All solid food                             Apple juices Sports drinks like Gatorade (No red) Lightly seasoned clear broth or consume(fat free) Sugar     Complete one G2 drink the morning of surgery at 7:00 AM the day of surgery.      The day of surgery:  Drink ONE (1) Pre-Surgery G2 the morning of surgery. Drink in one sitting. Do not sip.  This drink was given to you during your hospital  pre-op appointment visit. Nothing else to drink after completing the Pre-Surgery G2.          If you have questions, please contact your surgeon's  office.     Oral Hygiene is also important to reduce your risk of infection.  Remember - BRUSH YOUR TEETH THE MORNING OF SURGERY WITH YOUR REGULAR TOOTHPASTE   Do NOT smoke after Midnight  Take these medicines the morning of surgery with A SIP OF WATER:  Alprazolam, Omeprazole, Propranolol   Stop all vitamins and herbal supplements a week before surgery             You may not have any metal on your body including hair pins, jewelry, and body piercing             Do not wear make-up, lotions, powders, perfumes or deodorant  Do not wear nail polish including gel and S&S, artificial/acrylic nails, or any other type of covering on natural nails including finger and toenails. If you have artificial nails, gel coating, etc. that needs to be removed by a nail salon please have this removed prior to surgery or surgery may need to be canceled/ delayed if the surgeon/ anesthesia feels like they are unable to be safely monitored.   Do not shave  48 hours prior to surgery.      Do not bring valuables to the hospital. [place].   Contacts, dentures or bridgework may not be worn into surgery.    Patients discharged the day of surgery will not be allowed to drive home.  Please read over the following fact sheets you were given: IF YOU HAVE QUESTIONS ABOUT YOUR PRE OP INSTRUCTIONS PLEASE CALL [facility] - Preparing for Surgery Before surgery, you can play an important role.  Because skin is not sterile, your skin needs to be as free of germs as possible.  You can reduce the number of germs on your skin by washing with CHG (chlorahexidine gluconate) soap before surgery.  CHG is an antiseptic cleaner which kills germs and bonds with the skin to continue killing germs even after washing. Please DO NOT use if you have an allergy to CHG or antibacterial soaps.  If your skin becomes reddened/irritated stop using the  CHG and inform your nurse when you arrive at Short Stay. Do not shave (including legs and underarms) for at least 48 hours prior to the first CHG shower.  You may shave your face/neck.  Please follow these instructions carefully:  1.  Shower with CHG Soap the night before surgery and the  morning of surgery.  2.  If you choose to wash your hair, wash your hair first as usual with your normal  shampoo.  3.  After you shampoo, rinse your hair and body thoroughly to remove the shampoo.                             4.  Use CHG as you would any other liquid soap.  You can apply chg directly to the skin and wash.  Gently with a scrungie or clean washcloth.  5.  Apply the CHG Soap to your body ONLY FROM THE NECK DOWN.   Do   not use on face/ open                           Wound or open sores. Avoid contact with eyes, ears mouth and   genitals (private parts).                       Wash face,  Genitals (private parts) with your normal  soap.             6.  Wash thoroughly, paying special attention to the area where your    surgery  will be performed.  7.  Thoroughly rinse your body with warm water from the neck down.  8.  DO NOT shower/wash with your normal soap after using and rinsing off the CHG Soap.                9.  Pat yourself dry with a clean towel.            10.  Wear clean pajamas.            11.  Place clean sheets on your bed the night of your first shower and do not  sleep with pets. Day of Surgery : Do not apply any lotions/deodorants the morning of surgery.  Please wear clean clothes to the hospital/surgery center.  FAILURE TO FOLLOW THESE INSTRUCTIONS MAY RESULT IN THE CANCELLATION OF YOUR SURGERY  PATIENT SIGNATURE_________________________________  NURSE SIGNATURE__________________________________  ________________________________________________________________________   Kimberly Boyd  An incentive spirometer is a tool that can help keep your lungs clear and active.  This tool measures how well you are filling your lungs with each breath. Taking long deep breaths may help reverse or decrease the chance of developing breathing (pulmonary) problems (especially infection) following: A long period of time when you are unable to move or be active. BEFORE THE PROCEDURE  If the spirometer includes an indicator to show your best effort, your nurse or respiratory therapist will set it to a desired goal. If possible, sit up straight or lean slightly forward. Try not to slouch. Hold the incentive spirometer in an upright position. INSTRUCTIONS FOR USE  Sit on the edge of your bed if possible, or sit up as far as you can in bed or on a chair. Hold the incentive spirometer in an upright position. Breathe out normally. Place the mouthpiece in your mouth and seal your lips tightly around it. Breathe in slowly and as deeply as possible, raising the piston or the ball toward the top of the column. Hold your breath for 3-5 seconds or for as long as possible. Allow the piston or ball to fall to the bottom of the column. Remove the mouthpiece from your mouth and breathe out normally. Rest for a few seconds and repeat Steps 1 through 7 at least 10 times every 1-2 hours when you are awake. Take your time and take a few normal breaths between deep breaths. The spirometer may include an indicator to show your best effort. Use the indicator as a goal to work toward during each repetition. After each set of 10 deep breaths, practice coughing to be sure your lungs are clear. If you have an incision (the cut made at the time of surgery), support your incision when coughing by placing a pillow or rolled up towels firmly against it. Once you are able to get out of bed, walk around indoors and cough well. You may stop using the incentive spirometer when instructed by your caregiver.  RISKS AND COMPLICATIONS Take your time so you do not get dizzy or light-headed. If you are in pain, you may  need to take or ask for pain medication before doing incentive spirometry. It is harder to take a deep breath if you are having pain. AFTER USE Rest and breathe slowly and easily. It can be helpful to keep track of a log of your progress.  Your caregiver can provide you with a simple table to help with this. If you are using the spirometer at home, follow these instructions: [place] IF:  You are having difficultly using the spirometer. You have trouble using the spirometer as often as instructed. Your pain medication is not giving enough relief while using the spirometer. You develop fever of 100.5 F (38.1 C) or higher. SEEK IMMEDIATE MEDICAL CARE IF:  You cough up bloody sputum that had not been present before. You develop fever of 102 F (38.9 C) or greater. You develop worsening pain at or near the incision site. MAKE SURE YOU:  Understand these instructions. Will watch your condition. Will get help right away if you are not doing well or get worse. Document Released: 07/11/2006 Document Revised: 05/23/2011 Document Reviewed: 09/11/2006 Exit[employer] Patient Information 2014 [employer], [place].   ________________________________________________________________________  WHAT IS A BLOOD TRANSFUSION? Blood Transfusion Information  A transfusion is the replacement of blood or some of its parts. Blood is made up of multiple cells which provide different functions. Red blood cells carry oxygen and are used for blood loss replacement. White blood cells fight against infection. Platelets control bleeding. Plasma helps clot blood. Other blood products are available for specialized needs, such as hemophilia or other clotting disorders. BEFORE THE TRANSFUSION  Who gives blood for transfusions?  Healthy volunteers who are fully evaluated to make sure their blood is safe. This is blood bank blood. Transfusion therapy is the safest it has ever been in the practice of medicine. Before blood  is taken from a donor, a complete history is taken to make sure that person has no history of diseases nor engages in risky social behavior (examples are intravenous drug use or sexual activity with multiple partners). The donor's travel history is screened to minimize risk of transmitting infections, such as malaria. The donated blood is tested for signs of infectious diseases, such as HIV and hepatitis. The blood is then tested to be sure it is compatible with you in order to minimize the chance of a transfusion reaction. If you or a relative donates blood, this is often done in anticipation of surgery and is not appropriate for emergency situations. It takes many days to process the donated blood. RISKS AND COMPLICATIONS Although transfusion therapy is very safe and saves many lives, the main dangers of transfusion include:  Getting an infectious disease. Developing a transfusion reaction. This is an allergic reaction to something in the blood you were given. Every precaution is taken to prevent this. The decision to have a blood transfusion has been considered carefully by your caregiver before blood is given. Blood is not given unless the benefits outweigh the risks. AFTER THE TRANSFUSION Right after receiving a blood transfusion, you will usually feel much better and more energetic. This is especially true if your red blood cells have gotten low (anemic). The transfusion raises the level of the red blood cells which carry oxygen, and this usually causes an energy increase. The nurse administering the transfusion will monitor you carefully for complications. HOME CARE INSTRUCTIONS  No special instructions are needed after a transfusion. You may find your energy is better. Speak with your caregiver about any limitations on activity for underlying diseases you may have. SEEK MEDICAL CARE IF:  Your condition is not improving after your transfusion. You develop redness or irritation at the intravenous  (IV) site. SEEK IMMEDIATE MEDICAL CARE IF:  Any of the following symptoms occur over the next 12 hours:  Shaking chills. You have a temperature by mouth above 102 F (38.9 C), not controlled by medicine. Chest, back, or muscle pain. People around you feel you are not acting correctly or are confused. Shortness of breath or difficulty breathing. Dizziness and fainting. You get a rash or develop hives. You have a decrease in urine output. Your urine turns a dark color or changes to pink, red, or brown. Any of the following symptoms occur over the next 10 days: You have a temperature by mouth above 102 F (38.9 C), not controlled by medicine. Shortness of breath. Weakness after normal activity. The white part of the eye turns yellow (jaundice). You have a decrease in the amount of urine or are urinating less often. Your urine turns a dark color or changes to pink, red, or brown. Document Released: 02/26/2000 Document Revised: 05/23/2011 Document Reviewed: 10/15/2007 [facility] Patient Information 2014 [place], [place].  _______________________________________________________________________

## 2021-02-09 DIAGNOSIS — R070 Pain in throat: Secondary | ICD-10-CM | POA: Diagnosis not present

## 2021-02-09 DIAGNOSIS — R053 Chronic cough: Secondary | ICD-10-CM | POA: Diagnosis not present

## 2021-02-12 ENCOUNTER — Other Ambulatory Visit: Payer: Self-pay

## 2021-02-12 ENCOUNTER — Ambulatory Visit (HOSPITAL_COMMUNITY)
Admission: RE | Admit: 2021-02-12 | Discharge: 2021-02-12 | Disposition: A | Discharge status: Home / Self Care | Payer: 59 | Source: Ambulatory Visit | Attending: Orthopaedic Surgery | Admitting: Orthopaedic Surgery

## 2021-02-12 ENCOUNTER — Encounter (HOSPITAL_COMMUNITY): Payer: Self-pay

## 2021-02-12 ENCOUNTER — Encounter (HOSPITAL_COMMUNITY)
Admission: RE | Admit: 2021-02-12 | Discharge: 2021-02-12 | Disposition: A | Discharge status: Home / Self Care | Payer: 59 | Source: Ambulatory Visit | Attending: Orthopaedic Surgery | Admitting: Orthopaedic Surgery

## 2021-02-12 VITALS — BP 133/85 | HR 62 | Temp 98.5°F | Resp 18 | Ht 67.0 in | Wt 203.4 lb

## 2021-02-12 DIAGNOSIS — Z01818 Encounter for other preprocedural examination: Secondary | ICD-10-CM

## 2021-02-12 DIAGNOSIS — R7303 Prediabetes: Secondary | ICD-10-CM | POA: Insufficient documentation

## 2021-02-12 HISTORY — DX: Anemia, unspecified: D64.9

## 2021-02-12 HISTORY — DX: Supraventricular tachycardia: I47.1

## 2021-02-12 HISTORY — DX: Anxiety disorder, unspecified: F41.9

## 2021-02-12 HISTORY — DX: Prediabetes: R73.03

## 2021-02-12 HISTORY — DX: Supraventricular tachycardia, unspecified: I47.10

## 2021-02-12 LAB — URINALYSIS, ROUTINE W REFLEX MICROSCOPIC
Bilirubin Urine: NEGATIVE
Glucose, UA: NEGATIVE mg/dL
Ketones, ur: NEGATIVE mg/dL
Nitrite: NEGATIVE
Protein, ur: NEGATIVE mg/dL
Specific Gravity, Urine: 1.019 (ref 1.005–1.030)
pH: 6 (ref 5.0–8.0)

## 2021-02-12 LAB — CBC WITH DIFFERENTIAL/PLATELET
Abs Immature Granulocytes: 0.01 10*3/uL (ref 0.00–0.07)
Basophils Absolute: 0.1 10*3/uL (ref 0.0–0.1)
Basophils Relative: 1 %
Eosinophils Absolute: 0.1 10*3/uL (ref 0.0–0.5)
Eosinophils Relative: 1 %
HCT: 38.7 % (ref 36.0–46.0)
Hemoglobin: 12.8 g/dL (ref 12.0–15.0)
Immature Granulocytes: 0 %
Lymphocytes Relative: 38 %
Lymphs Abs: 2.3 10*3/uL (ref 0.7–4.0)
MCH: 31 pg (ref 26.0–34.0)
MCHC: 33.1 g/dL (ref 30.0–36.0)
MCV: 93.7 fL (ref 80.0–100.0)
Monocytes Absolute: 0.6 10*3/uL (ref 0.1–1.0)
Monocytes Relative: 10 %
Neutro Abs: 3 10*3/uL (ref 1.7–7.7)
Neutrophils Relative %: 50 %
Platelets: 310 10*3/uL (ref 150–400)
RBC: 4.13 MIL/uL (ref 3.87–5.11)
RDW: 14.7 % (ref 11.5–15.5)
WBC: 6 10*3/uL (ref 4.0–10.5)
nRBC: 0 % (ref 0.0–0.2)

## 2021-02-12 LAB — BASIC METABOLIC PANEL
Anion gap: 9 (ref 5–15)
BUN: 17 mg/dL (ref 6–20)
CO2: 29 mmol/L (ref 22–32)
Calcium: 9.7 mg/dL (ref 8.9–10.3)
Chloride: 99 mmol/L (ref 98–111)
Creatinine, Ser: 0.6 mg/dL (ref 0.44–1.00)
GFR, Estimated: >60 mL/min (ref >60)
Glucose, Bld: 107 mg/dL — ABNORMAL HIGH (ref 70–99)
Potassium: 3.9 mmol/L (ref 3.5–5.1)
Sodium: 137 mmol/L (ref 135–145)

## 2021-02-12 LAB — GLUCOSE, CAPILLARY: Glucose-Capillary: 115 mg/dL — ABNORMAL HIGH (ref 70–99)

## 2021-02-12 LAB — APTT: aPTT: 35 seconds (ref 24–36)

## 2021-02-12 LAB — PROTIME-INR
INR: 1 (ref 0.8–1.2)
Prothrombin Time: 13.1 s (ref 11.4–15.2)

## 2021-02-12 LAB — HEMOGLOBIN A1C
Hgb A1c MFr Bld: 6 % — ABNORMAL HIGH (ref 4.8–5.6)
Mean Plasma Glucose: 125.5 mg/dL

## 2021-02-12 LAB — TYPE AND SCREEN
ABO/RH(D): A POS
Antibody Screen: NEGATIVE

## 2021-02-12 LAB — SURGICAL PCR SCREEN
MRSA, PCR: NEGATIVE
Staphylococcus aureus: NEGATIVE

## 2021-02-16 DIAGNOSIS — N951 Menopausal and female climacteric states: Secondary | ICD-10-CM | POA: Diagnosis not present

## 2021-02-16 DIAGNOSIS — I10 Essential (primary) hypertension: Secondary | ICD-10-CM | POA: Diagnosis not present

## 2021-02-16 DIAGNOSIS — Z01419 Encounter for gynecological examination (general) (routine) without abnormal findings: Secondary | ICD-10-CM | POA: Diagnosis not present

## 2021-02-16 DIAGNOSIS — D259 Leiomyoma of uterus, unspecified: Secondary | ICD-10-CM | POA: Diagnosis not present

## 2021-02-16 NOTE — Progress Notes (Signed)
Anesthesia Chart Review   Case: [id number] Date/Time: 02/23/21 0945   Procedure: RIGHT TOTAL KNEE ARTHROPLASTY (Right: Knee)   Anesthesia type: Spinal   Pre-op diagnosis: RIGHT KNEE DEGENERATIVE JOINT DISEASE   Location: [facility] ROOM 06 / [facility]   Surgeons: Melrose Nakayama, MD       DISCUSSION:55 y.o. never smoker with h/o GERD, HTN, palpitations, right knee djd scheduled for above procedure 02/23/2021 with Dr. Melrose Nakayama.   Pt last seen by cardiology 01/05/2021. Per OV note pt with h/o adenosine sensitive SVT, PAC, PVCs. Per Dr. Caryl Comes, "Functional status is good limited by knee. She is cleared for her surgery."  Anticipate pt can proceed with planned procedure barring acute status change.   VS: BP 133/85   Pulse 62   Temp 36.9 C (Oral)   Resp 18   Ht 5\' 7"  (1.702 m)   Wt 92.3 kg   SpO2 100%   BMI 31.86 kg/m   PROVIDERS: Vernie Shanks, MD is PCP   Virl Axe, MD is Cardiologist  LABS: Labs reviewed: Acceptable for surgery. (all labs ordered are listed, but only abnormal results are displayed)  Labs Reviewed  BASIC METABOLIC PANEL - Abnormal; Notable for the following components:      Result Value   Glucose, Bld 107 (*)    All other components within normal limits  URINALYSIS, ROUTINE W REFLEX MICROSCOPIC - Abnormal; Notable for the following components:   Hgb urine dipstick SMALL (*)    Leukocytes,Ua TRACE (*)    Bacteria, UA RARE (*)    All other components within normal limits  HEMOGLOBIN A1C - Abnormal; Notable for the following components:   Hgb A1c MFr Bld 6.0 (*)    All other components within normal limits  GLUCOSE, CAPILLARY - Abnormal; Notable for the following components:   Glucose-Capillary 115 (*)    All other components within normal limits  SURGICAL PCR SCREEN  CBC WITH DIFFERENTIAL/PLATELET  PROTIME-INR  APTT  TYPE AND SCREEN     IMAGES:   EKG: 01/05/2021 Rate 65 bpm   CV:  Past Medical History:  Diagnosis Date   Anemia     Anxiety    Arthritis    Arthritis of left foot    Arthritis of right knee    Back pain years.    chronic low back pain    Chronic back pain    Complication of anesthesia    tachycardia, difficulty waking up   GERD (gastroesophageal reflux disease)    Heart murmur    questionable   Hypertension    Obesity    Palpitations    Pre-diabetes    SVT (supraventricular tachycardia) (HCC)     Past Surgical History:  Procedure Laterality Date   BREAST BIOPSY Right    Benign approx age 26   cervical dysplasia laser     dermoid cyst removal     IR GENERIC HISTORICAL  06/04/2014   IR RADIOLOGIST EVAL & MGMT 06/04/2014 Sandi Mariscal, MD [facility] INTERV RAD   LAPAROSCOPIC CHOLECYSTECTOMY     LIPOMA RESECTION     and redone   ORIF ANKLE FRACTURE  08/09/2011   Procedure: OPEN REDUCTION INTERNAL FIXATION (ORIF) ANKLE FRACTURE;  Surgeon: Wylene Simmer, MD;  Location: [facility];  Service: Orthopedics;  Laterality: Right;  ORIF Right Ankle Lateral Malleolus   scar revision for the lipoma     WISDOM TOOTH EXTRACTION      MEDICATIONS:  ALPRAZolam (XANAX) 0.5 MG tablet   amoxicillin-clavulanate (AUGMENTIN)  875-125 MG tablet   Biotin 2500 MCG CAPS   Cholecalciferol (VITAMIN D3) 50 MCG (2000 UT) TABS   Cholecalciferol 100 MCG (4000 UT) CAPS   diclofenac (VOLTAREN) 75 MG EC tablet   ELDERBERRY PO   Magnesium 250 MG TABS   ofloxacin (OCUFLOX) 0.3 % ophthalmic solution   omeprazole (PRILOSEC) 20 MG capsule   Probiotic Product (PROBIOTIC PO)   propranolol ER (INDERAL LA) 80 MG 24 hr capsule   triamterene-hydrochlorothiazide (MAXZIDE-25) 37.5-25 MG tablet   vitamin C (ASCORBIC ACID) 500 MG tablet   zinc sulfate 220 (50 Zn) MG capsule   No current facility-administered medications for this encounter.   Konrad Felix Ward, PA-C [facility] Pre-Surgical Testing [phone]

## 2021-02-18 ENCOUNTER — Other Ambulatory Visit (HOSPITAL_COMMUNITY): Payer: Self-pay

## 2021-02-19 ENCOUNTER — Other Ambulatory Visit (HOSPITAL_COMMUNITY): Payer: Self-pay

## 2021-02-19 MED ORDER — OMEPRAZOLE 20 MG PO CPDR
DELAYED_RELEASE_CAPSULE | ORAL | 0 refills | Status: DC
  Filled 2021-02-19: qty 90, 90d supply, fill #0

## 2021-02-20 ENCOUNTER — Encounter (HOSPITAL_BASED_OUTPATIENT_CLINIC_OR_DEPARTMENT_OTHER): Payer: Self-pay | Admitting: Emergency Medicine

## 2021-02-20 ENCOUNTER — Other Ambulatory Visit: Payer: Self-pay

## 2021-02-20 ENCOUNTER — Emergency Department (HOSPITAL_BASED_OUTPATIENT_CLINIC_OR_DEPARTMENT_OTHER)
Admission: EM | Admit: 2021-02-20 | Discharge: 2021-02-20 | Disposition: A | Discharge status: Home / Self Care | Payer: 59 | Attending: Emergency Medicine | Admitting: Emergency Medicine

## 2021-02-20 DIAGNOSIS — I498 Other specified cardiac arrhythmias: Secondary | ICD-10-CM | POA: Insufficient documentation

## 2021-02-20 DIAGNOSIS — E876 Hypokalemia: Secondary | ICD-10-CM | POA: Insufficient documentation

## 2021-02-20 DIAGNOSIS — I10 Essential (primary) hypertension: Secondary | ICD-10-CM | POA: Diagnosis not present

## 2021-02-20 DIAGNOSIS — R002 Palpitations: Secondary | ICD-10-CM

## 2021-02-20 DIAGNOSIS — Z79899 Other long term (current) drug therapy: Secondary | ICD-10-CM | POA: Insufficient documentation

## 2021-02-20 LAB — CBC WITH DIFFERENTIAL/PLATELET
Abs Immature Granulocytes: 0.01 10*3/uL (ref 0.00–0.07)
Basophils Absolute: 0.1 10*3/uL (ref 0.0–0.1)
Basophils Relative: 1 %
Eosinophils Absolute: 0.1 10*3/uL (ref 0.0–0.5)
Eosinophils Relative: 1 %
HCT: 36.6 % (ref 36.0–46.0)
Hemoglobin: 12.5 g/dL (ref 12.0–15.0)
Immature Granulocytes: 0 %
Lymphocytes Relative: 42 %
Lymphs Abs: 2.8 10*3/uL (ref 0.7–4.0)
MCH: 30.9 pg (ref 26.0–34.0)
MCHC: 34.2 g/dL (ref 30.0–36.0)
MCV: 90.6 fL (ref 80.0–100.0)
Monocytes Absolute: 0.7 10*3/uL (ref 0.1–1.0)
Monocytes Relative: 10 %
Neutro Abs: 3.1 10*3/uL (ref 1.7–7.7)
Neutrophils Relative %: 46 %
Platelets: 315 10*3/uL (ref 150–400)
RBC: 4.04 MIL/uL (ref 3.87–5.11)
RDW: 14.3 % (ref 11.5–15.5)
WBC: 6.7 10*3/uL (ref 4.0–10.5)
nRBC: 0 % (ref 0.0–0.2)

## 2021-02-20 LAB — COMPREHENSIVE METABOLIC PANEL
ALT: 24 U/L (ref 0–44)
AST: 19 U/L (ref 15–41)
Albumin: 3.8 g/dL (ref 3.5–5.0)
Alkaline Phosphatase: 67 U/L (ref 38–126)
Anion gap: 8 (ref 5–15)
BUN: 19 mg/dL (ref 6–20)
CO2: 26 mmol/L (ref 22–32)
Calcium: 9.3 mg/dL (ref 8.9–10.3)
Chloride: 105 mmol/L (ref 98–111)
Creatinine, Ser: 0.55 mg/dL (ref 0.44–1.00)
GFR, Estimated: >60 mL/min (ref >60)
Glucose, Bld: 141 mg/dL — ABNORMAL HIGH (ref 70–99)
Potassium: 3 mmol/L — ABNORMAL LOW (ref 3.5–5.1)
Sodium: 139 mmol/L (ref 135–145)
Total Bilirubin: 0.2 mg/dL — ABNORMAL LOW (ref 0.3–1.2)
Total Protein: 7.9 g/dL (ref 6.5–8.1)

## 2021-02-20 LAB — MAGNESIUM: Magnesium: 2 mg/dL (ref 1.7–2.4)

## 2021-02-20 LAB — TSH: TSH: 2.3 u[IU]/mL (ref 0.350–4.500)

## 2021-02-20 MED ORDER — POTASSIUM CHLORIDE CRYS ER 20 MEQ PO TBCR
40.0000 meq | EXTENDED_RELEASE_TABLET | Freq: Once | ORAL | Status: AC
  Administered 2021-02-20: 40 meq via ORAL
  Filled 2021-02-20: qty 2

## 2021-02-20 MED ORDER — POTASSIUM CHLORIDE ER 10 MEQ PO TBCR: 10.0000 meq | EXTENDED_RELEASE_TABLET | Freq: Every day | ORAL | 0 refills | Status: DC

## 2021-02-20 MED ORDER — SODIUM CHLORIDE 0.9 % IV BOLUS
1000.0000 mL | Freq: Once | INTRAVENOUS | Status: AC
  Administered 2021-02-20: 1000 mL via INTRAVENOUS

## 2021-02-20 MED ORDER — POTASSIUM CHLORIDE 10 MEQ/100ML IV SOLN
10.0000 meq | Freq: Once | INTRAVENOUS | Status: AC
  Administered 2021-02-20: 10 meq via INTRAVENOUS
  Filled 2021-02-20: qty 100

## 2021-02-20 NOTE — ED Notes (Signed)
Pt. Reports her heart has been this way for the last 2 hours.  She has PACs and PVCs.

## 2021-02-20 NOTE — ED Triage Notes (Signed)
Patient arrived via POV c/o irregular heart rate x 2 hrs. Patient has hx of irregular heart rate/svt. Patient states very uncomfortable. States seeing cardiology about same with PAC's/PVC's. Patient is AO x 4, VS WDL, normal gait.

## 2021-02-20 NOTE — ED Provider Notes (Signed)
[facility] EMERGENCY DEPARTMENT Provider Note  CSN: [id number] Arrival date & time: 02/20/21 0024  Chief Complaint(s) Irregular Heart Beat  HPI Kimberly Boyd is a 55 y.o. female with a past medical history listed below who presents to the emergency department with palpitations that have been ongoing for several years.  They have become more frequent over the past several months and even more so over the past several weeks.  Usually they are brief and self resolving.  Tonight her palpitations have been ongoing for 2 to 3 hours intermittently.  She denies any associated chest pain or shortness of breath but does report feeling "lightheaded."  No associated nausea or vomiting.  No abdominal pain.  No diarrhea.  Patient was recently on Augmentin for respiratory infection.  Other than that patient denies any new medications or dose changes.  She is already followed by cardiology.   HPI  Past Medical History Past Medical History:  Diagnosis Date   Anemia    Anxiety    Arthritis    Arthritis of left foot    Arthritis of right knee    Back pain years.    chronic low back pain    Chronic back pain    Complication of anesthesia    tachycardia, difficulty waking up   GERD (gastroesophageal reflux disease)    Heart murmur    questionable   Hypertension    Obesity    Palpitations    Pre-diabetes    SVT (supraventricular tachycardia) ([place])    Patient Active Problem List   Diagnosis Date Noted   Other hyperlipidemia 12/22/2020   Prediabetes 05/13/2020   Arthritis of right knee 05/13/2020   Asymptomatic varicose veins of bilateral lower extremities 05/13/2020   Dysplastic nevus 05/13/2020   Elevated erythrocyte sedimentation rate 05/13/2020   Gout 05/13/2020   Gouty arthropathy 05/13/2020   Hot flashes 05/13/2020   Hyperglycemia 05/13/2020   Insomnia 05/13/2020   Menopause 05/13/2020   Iron deficiency anemia due to chronic blood loss 05/13/2020   Pica 05/13/2020    Pure hypercholesterolemia 05/13/2020   Right knee pain 05/13/2020   Sacroiliitis, not elsewhere classified ([facility]) 05/13/2020   Vitamin D insufficiency 05/13/2020   Class 1 obesity with serious comorbidity and body mass index (BMI) of 33.0 to 33.9 in adult    Essential hypertension 08/03/2017   Osteoarthritis of right knee 05/20/2015   Fibroid, uterine    Fever 09/30/2010   Supraventricular tachycardia-probable concealed accessory pathway 08/31/2010   Stress 08/31/2010   ALLERGIC RHINITIS 11/20/2008   GERD 11/20/2008   PALPITATIONS 11/20/2008   CHEST PAIN 11/20/2008   Home Medication(s) Prior to Admission medications   Medication Sig Start Date End Date Taking? Authorizing Provider  potassium chloride (KLOR-CON) 10 MEQ tablet Take 1 tablet (10 mEq total) by mouth daily for 7 days. 02/20/21 02/27/21 Yes Solmon Bohr, Grayce Sessions, MD  ALPRAZolam Duanne Moron) 0.5 MG tablet Take 0.5 mg by mouth daily as needed for anxiety.    [provider]  amoxicillin-clavulanate (AUGMENTIN) 875-125 MG tablet Take 1 tablet by mouth every 12 hours for 7 days. Patient not taking: Reported on 02/09/2021 01/25/21     Biotin 2500 MCG CAPS Take 2,500 mcg by mouth daily. Gummy    [provider]  Cholecalciferol (VITAMIN D3) 50 MCG (2000 UT) TABS Take 4,000 Units by mouth in the morning.    [provider]  Cholecalciferol 100 MCG (4000 UT) CAPS Take 1 capsule (4,000 Units total) by mouth daily. Patient not  taking: Reported on 02/09/2021 12/22/20   Whitmire, Joneen Boers, FNP  diclofenac (VOLTAREN) 75 MG EC tablet Take 1 tablet (75 mg total) by mouth 2 (two) times daily. Patient taking differently: Take 75 mg by mouth daily as needed (pain.). 09/29/17   Wallene Huh, DPM  ELDERBERRY PO Take 1 tablet by mouth 3 (three) times a week.    [provider]  Magnesium 250 MG TABS Take 250 mg by mouth 3 (three) times a week.    [provider]  ofloxacin (OCUFLOX) 0.3 % ophthalmic  solution Place 2 drops into both eyes 4 times daily for 7 days. Patient not taking: Reported on 02/09/2021 01/26/21   Whitmire, Joneen Boers, FNP  omeprazole (PRILOSEC) 20 MG capsule Take 20 mg by mouth daily as needed (heartburn/indigestion.).    [provider]  omeprazole (PRILOSEC) 20 MG capsule Take 1 capsule by mouth once a day 02/19/21     Probiotic Product (PROBIOTIC PO) Take 1 capsule by mouth daily.    [provider]  propranolol ER (INDERAL LA) 80 MG 24 hr capsule Take 1 capsule (80 mg total) by mouth daily. 12/28/20     triamterene-hydrochlorothiazide (MAXZIDE-25) 37.5-25 MG tablet TAKE 1 TABLET BY MOUTH EVERY MORNING 12/09/19 02/10/23  Vernie Shanks, MD  vitamin C (ASCORBIC ACID) 500 MG tablet Take 1,000 mg by mouth daily.    [provider]  zinc sulfate 220 (50 Zn) MG capsule Take 220 mg by mouth 3 (three) times a week.    [provider]                                                                                                                                    Past Surgical History Past Surgical History:  Procedure Laterality Date   BREAST BIOPSY Right    Benign approx age 55   cervical dysplasia laser     dermoid cyst removal     IR GENERIC HISTORICAL  06/04/2014   IR RADIOLOGIST EVAL & MGMT 06/04/2014 Sandi Mariscal, MD [facility] INTERV RAD   LAPAROSCOPIC CHOLECYSTECTOMY     LIPOMA RESECTION     and redone   ORIF ANKLE FRACTURE  08/09/2011   Procedure: OPEN REDUCTION INTERNAL FIXATION (ORIF) ANKLE FRACTURE;  Surgeon: Wylene Simmer, MD;  Location: [facility];  Service: Orthopedics;  Laterality: Right;  ORIF Right Ankle Lateral Malleolus   scar revision for the lipoma     WISDOM TOOTH EXTRACTION     Family History Family History  Problem Relation Age of Onset   High blood pressure Mother    Cancer Mother    High blood pressure Father    High Cholesterol Father    Stroke Father    Kidney disease Father    Cancer Father     Social  History Social History   Tobacco Use   Smoking status: Never   Smokeless tobacco: Never  Vaping  Use   Vaping Use: Never used  Substance Use Topics   Alcohol use: Yes    Comment: rare wine/mixed drink   Drug use: No   Allergies Doxycycline  Review of Systems Review of Systems All other systems are reviewed and are negative for acute change except as noted in the HPI  Physical Exam Vital Signs  I have reviewed the triage vital signs BP 122/77   Pulse 75   Temp 98.4 F (36.9 C) (Oral)   Resp 16   Ht 5\' 7"  (1.702 m)   Wt 92.5 kg   SpO2 99%   BMI 31.95 kg/m   Physical Exam Vitals reviewed.  Constitutional:      General: She is not in acute distress.    Appearance: She is well-developed. She is not diaphoretic.  HENT:     Head: Normocephalic and atraumatic.     Nose: Nose normal.  Eyes:     General: No scleral icterus.       Right eye: No discharge.        Left eye: No discharge.     Conjunctiva/sclera: Conjunctivae normal.     Pupils: Pupils are equal, round, and reactive to light.  Cardiovascular:     Rate and Rhythm: Normal rate. Rhythm regularly irregular.     Heart sounds: No murmur heard.   No friction rub. No gallop.  Pulmonary:     Effort: Pulmonary effort is normal. No respiratory distress.     Breath sounds: Normal breath sounds. No stridor. No rales.  Abdominal:     General: There is no distension.     Palpations: Abdomen is soft.     Tenderness: There is no abdominal tenderness.  Musculoskeletal:        General: No tenderness.     Cervical back: Normal range of motion and neck supple.  Skin:    General: Skin is warm and dry.     Findings: No erythema or rash.  Neurological:     Mental Status: She is alert and oriented to person, place, and time.    ED Results and Treatments Labs (all labs ordered are listed, but only abnormal results are displayed) Labs Reviewed  COMPREHENSIVE METABOLIC PANEL - Abnormal; Notable for the following  components:      Result Value   Potassium 3.0 (*)    Glucose, Bld 141 (*)    Total Bilirubin 0.2 (*)    All other components within normal limits  CBC WITH DIFFERENTIAL/PLATELET  MAGNESIUM  TSH                                                                                                                         EKG  EKG Interpretation  Date/Time:  Saturday February 20 2021 00:33:13 EST Ventricular Rate:  97 PR Interval:  226 QRS Duration: 96 QT Interval:  370 QTC Calculation: 470 R Axis:   65 Text Interpretation: Sinus rhythm Atrial premature complex Prolonged PR interval Abnormal R-wave  progression, early transition Confirmed by Addison Lank [id number]) on 02/20/2021 7:19:49 AM       Radiology No results found.  Pertinent labs & imaging results that were available during my care of the patient were reviewed by me and considered in my medical decision making (see MDM for details).  Medications Ordered in ED Medications  sodium chloride 0.9 % bolus 1,000 mL ( Intravenous Stopped 02/20/21 0344)  potassium chloride 10 mEq in 100 mL IVPB (0 mEq Intravenous Stopped 02/20/21 0319)  potassium chloride SA (KLOR-CON M) CR tablet 40 mEq (40 mEq Oral Given 02/20/21 0220)                                                                                                                                     Procedures .1-3 Lead EKG Interpretation Performed by: Fatima Blank, MD Authorized by: Fatima Blank, MD     Interpretation: abnormal     ECG rate:  98   ECG rate assessment: normal     Rhythm: sinus rhythm     Ectopy: PAC     Conduction: normal    (including critical care time)  Medical Decision Making / ED Course I have reviewed the nursing notes for this encounter and the patient's prior records (if available in EHR or on provided paperwork).  EMMALI KAROW was evaluated in Emergency Department on 02/20/2021 for the symptoms described in the history of  present illness. She was evaluated in the context of the global COVID-19 pandemic, which necessitated consideration that the patient might be at risk for infection with the SARS-CoV-2 virus that causes COVID-19. Institutional protocols and algorithms that pertain to the evaluation of patients at risk for COVID-19 are in a state of rapid change based on information released by regulatory bodies including the CDC and federal and state organizations. These policies and algorithms were followed during the patient's care in the ED.     Patient presents with palpitations. Recent respiratory infection treated with antibiotics.  Is been several weeks since she completed her antibiotics. No recent nausea, vomiting or diarrhea. EKG notable for PACs.  Telemetry noted for intermittent frequent PACs and sinus arrhythmia.  Screening labs obtain. Patient given IV fluids.  Pertinent labs & imaging results that were available during my care of the patient were reviewed by me and considered in my medical decision making:  CBC without leukocytosis or anemia. Metabolic panel notable for hypokalemia at 3.0 down from 3.9  8 days ago. Patient is on Maxide.  No other diuretic. Magnesium within normal limits. TSH ordered to assess for any thyroid dysfunction but will not return today.  Patient was instructed to have PCP follow-up the results.  Potassium was repleted through IV and orally. After repletion, patient's PACs and palpitations are resolved. Given the fact that patient is on the [facility], she will only be given short course of potassium supplements. Instructed to follow-up closely  with her primary care provider.   Final Clinical Impression(s) / ED Diagnoses Final diagnoses:  Hypokalemia  Sinus arrhythmia  Palpitations   The patient appears reasonably screened and/or stabilized for discharge and I doubt any other medical condition or other [facility] requiring further screening, evaluation, or treatment in the  ED at this time prior to discharge. Safe for discharge with strict return precautions.  Disposition: Discharge  Condition: Good  I have discussed the results, Dx and Tx plan with the patient/family who expressed understanding and agree(s) with the plan. Discharge instructions discussed at length. The patient/family was given strict return precautions who verbalized understanding of the instructions. No further questions at time of discharge.    ED Discharge Orders          Ordered    potassium chloride (KLOR-CON) 10 MEQ tablet  Daily        02/20/21 0538             Follow Up: Vernie Shanks, MD [place] [address] [phone]  Call  to schedule an appointment for close follow up     This chart was dictated using voice recognition software.  Despite best efforts to proofread,  errors can occur which can change the documentation meaning.    Fatima Blank, MD 02/20/21 [phone]

## 2021-02-21 ENCOUNTER — Encounter: Payer: Self-pay | Admitting: Internal Medicine

## 2021-02-21 ENCOUNTER — Telehealth: Payer: Self-pay | Admitting: Internal Medicine

## 2021-02-21 MED ORDER — POTASSIUM CHLORIDE CRYS ER 10 MEQ PO TBCR
10.0000 meq | EXTENDED_RELEASE_TABLET | Freq: Every day | ORAL | 1 refills | Status: DC
  Filled 2021-02-21: qty 30, 30d supply, fill #0

## 2021-02-21 MED ORDER — PROPRANOLOL HCL ER 120 MG PO CP24
120.0000 mg | ORAL_CAPSULE | Freq: Every day | ORAL | 5 refills | Status: DC
  Filled 2021-02-21: qty 30, 30d supply, fill #0
  Filled 2021-04-19: qty 30, 30d supply, fill #1
  Filled 2021-05-17: qty 30, 30d supply, fill #2
  Filled 2021-06-07 – 2021-06-21 (×2): qty 30, 30d supply, fill #3
  Filled 2021-07-20: qty 30, 30d supply, fill #4
  Filled 2021-08-24: qty 30, 30d supply, fill #5

## 2021-02-21 NOTE — Telephone Encounter (Signed)
Paged by Ms. Repinski re more frequent palpitations. She was seen in the ED yesterday for similar concerns. Noted to have PACs/PVCs (similar to prior). She was hypokalemic, and was given potassium. She was d/c'd with potassium supplementation. She notes the palpitations seem to happen more often in the evening. She has been more stressed with surgery upcoming. No associated chest pain, SOB, jaw pain, arm pain, etc. Palpitations are just bothersome more than anything. We discussed increasing her propranolol which she was agreeable to. I have increased her propranolol to 120 mg qd and refilled her potassium supplementation. She will let us know if her palpitations do not improve.

## 2021-02-22 ENCOUNTER — Other Ambulatory Visit (HOSPITAL_COMMUNITY): Payer: Self-pay

## 2021-02-22 ENCOUNTER — Encounter: Payer: Self-pay | Admitting: Internal Medicine

## 2021-02-22 NOTE — H&P (Signed)
TOTAL KNEE ADMISSION H&P  Patient is being admitted for right total knee arthroplasty.  Subjective:  Chief Complaint:right knee pain.  HPI: Kimberly Boyd, 55 y.o. female, has a history of pain and functional disability in the right knee due to arthritis and has failed non-surgical conservative treatments for greater than 12 weeks to includeNSAID's and/or analgesics, corticosteriod injections, viscosupplementation injections, flexibility and strengthening excercises, supervised PT with diminished ADL's post treatment, use of assistive devices, weight reduction as appropriate, and activity modification.  Onset of symptoms was gradual, starting 5 years ago with gradually worsening course since that time. The patient noted no past surgery on the right knee(s).  Patient currently rates pain in the right knee(s) at 10 out of 10 with activity. Patient has night pain, worsening of pain with activity and weight bearing, pain that interferes with activities of daily living, crepitus, and joint swelling.  Patient has evidence of subchondral cysts, subchondral sclerosis, periarticular osteophytes, and joint space narrowing by imaging studies. There is no active infection.  Patient Active Problem List   Diagnosis Date Noted   Other hyperlipidemia 12/22/2020   Prediabetes 05/13/2020   Arthritis of right knee 05/13/2020   Asymptomatic varicose veins of bilateral lower extremities 05/13/2020   Dysplastic nevus 05/13/2020   Elevated erythrocyte sedimentation rate 05/13/2020   Gout 05/13/2020   Gouty arthropathy 05/13/2020   Hot flashes 05/13/2020   Hyperglycemia 05/13/2020   Insomnia 05/13/2020   Menopause 05/13/2020   Iron deficiency anemia due to chronic blood loss 05/13/2020   Pica 05/13/2020   Pure hypercholesterolemia 05/13/2020   Right knee pain 05/13/2020   Sacroiliitis, not elsewhere classified ([place]) 05/13/2020   Vitamin D insufficiency 05/13/2020   Class 1 obesity with serious comorbidity and  body mass index (BMI) of 33.0 to 33.9 in adult    Essential hypertension 08/03/2017   Osteoarthritis of right knee 05/20/2015   Fibroid, uterine    Fever 09/30/2010   Supraventricular tachycardia-probable concealed accessory pathway 08/31/2010   Stress 08/31/2010   ALLERGIC RHINITIS 11/20/2008   GERD 11/20/2008   PALPITATIONS 11/20/2008   CHEST PAIN 11/20/2008   Past Medical History:  Diagnosis Date   Anemia    Anxiety    Arthritis    Arthritis of left foot    Arthritis of right knee    Back pain years.    chronic low back pain    Chronic back pain    Complication of anesthesia    tachycardia, difficulty waking up   GERD (gastroesophageal reflux disease)    Heart murmur    questionable   Hypertension    Obesity    Palpitations    Pre-diabetes    SVT (supraventricular tachycardia) ([facility])     Past Surgical History:  Procedure Laterality Date   BREAST BIOPSY Right    Benign approx age 87   cervical dysplasia laser     dermoid cyst removal     IR GENERIC HISTORICAL  06/04/2014   IR RADIOLOGIST EVAL & MGMT 06/04/2014 Sandi Mariscal, MD [facility] INTERV RAD   LAPAROSCOPIC CHOLECYSTECTOMY     LIPOMA RESECTION     and redone   ORIF ANKLE FRACTURE  08/09/2011   Procedure: OPEN REDUCTION INTERNAL FIXATION (ORIF) ANKLE FRACTURE;  Surgeon: Wylene Simmer, MD;  Location: [facility] [place];  Service: Orthopedics;  Laterality: Right;  ORIF Right Ankle Lateral Malleolus   scar revision for the lipoma     WISDOM TOOTH EXTRACTION      No current facility-administered medications for this  encounter.   Current Outpatient Medications  Medication Sig Dispense Refill Last Dose   ALPRAZolam (XANAX) 0.5 MG tablet Take 0.5 mg by mouth daily as needed for anxiety.      Biotin 2500 MCG CAPS Take 2,500 mcg by mouth daily. Gummy      Cholecalciferol (VITAMIN D3) 50 MCG (2000 UT) TABS Take 4,000 Units by mouth in the morning.      diclofenac (VOLTAREN) 75 MG EC tablet Take 1 tablet (75 mg total) by mouth 2 (two)  times daily. (Patient taking differently: Take 75 mg by mouth daily as needed (pain.).) 50 tablet 2    ELDERBERRY PO Take 1 tablet by mouth 3 (three) times a week.      Magnesium 250 MG TABS Take 250 mg by mouth 3 (three) times a week.      omeprazole (PRILOSEC) 20 MG capsule Take 20 mg by mouth daily as needed (heartburn/indigestion.).      Probiotic Product (PROBIOTIC PO) Take 1 capsule by mouth daily.      triamterene-hydrochlorothiazide (MAXZIDE-25) 37.5-25 MG tablet TAKE 1 TABLET BY MOUTH EVERY MORNING 90 tablet 2    vitamin C (ASCORBIC ACID) 500 MG tablet Take 1,000 mg by mouth daily.      zinc sulfate 220 (50 Zn) MG capsule Take 220 mg by mouth 3 (three) times a week.      amoxicillin-clavulanate (AUGMENTIN) 875-125 MG tablet Take 1 tablet by mouth every 12 hours for 7 days. (Patient not taking: Reported on 02/09/2021) 14 tablet 0 Not Taking   Cholecalciferol 100 MCG (4000 UT) CAPS Take 1 capsule (4,000 Units total) by mouth daily. (Patient not taking: Reported on 02/09/2021) 30 capsule  Not Taking   ofloxacin (OCUFLOX) 0.3 % ophthalmic solution Place 2 drops into both eyes 4 times daily for 7 days. (Patient not taking: Reported on 02/09/2021) 5 mL 0 Not Taking   omeprazole (PRILOSEC) 20 MG capsule Take 1 capsule by mouth once a day 90 capsule 0    potassium chloride (KLOR-CON M) 10 MEQ tablet Take 1 tablet (10 mEq total) by mouth daily. 30 tablet 1    propranolol ER (INDERAL LA) 120 MG 24 hr capsule Take 1 capsule (120 mg total) by mouth daily. 30 capsule 5    Allergies  Allergen Reactions   Doxycycline Other (See Comments)    Esophagitis; opened up in my esophagus    Social History   Tobacco Use   Smoking status: Never   Smokeless tobacco: Never  Substance Use Topics   Alcohol use: Yes    Comment: rare wine/mixed drink    Family History  Problem Relation Age of Onset   High blood pressure Mother    Cancer Mother    High blood pressure Father    High Cholesterol Father     Stroke Father    Kidney disease Father    Cancer Father      Review of Systems  Musculoskeletal:  Positive for arthralgias.       Right knee  All other systems reviewed and are negative.  Objective:  Physical Exam Constitutional:      Appearance: Normal appearance.  HENT:     Head: Normocephalic and atraumatic.     Mouth/Throat:     Pharynx: Oropharynx is clear.  Eyes:     Extraocular Movements: Extraocular movements intact.  Cardiovascular:     Rate and Rhythm: Normal rate and regular rhythm.  Pulmonary:     Effort: Pulmonary effort is normal.  Abdominal:     Palpations: Abdomen is soft.  Musculoskeletal:     Cervical back: Normal range of motion.     Comments: Right knee has a mild valgus deformity.  She maintains good motion from 0-120.  She has lateral greater than medial joint line pain with some crepitation.  Hip motion is full and straight leg raise is negative.    Skin:    General: Skin is warm and dry.  Neurological:     General: No focal deficit present.     Mental Status: She is alert and oriented to person, place, and time. Mental status is at baseline.  Psychiatric:        Mood and Affect: Mood normal.        Behavior: Behavior normal.        Thought Content: Thought content normal.        Judgment: Judgment normal.    Vital signs in last 24 hours:    Labs:   Estimated body mass index is 31.95 kg/m as calculated from the following:   Height as of 02/20/21: 5\' 7"  (1.702 m).   Weight as of 02/20/21: 92.5 kg.   Imaging Review Plain radiographs demonstrate severe degenerative joint disease of the right knee(s). The overall alignment isneutral. The bone quality appears to be good for age and reported activity level.    Assessment/Plan:  End stage primary arthritis, right knee   The patient history, physical examination, clinical judgment of the provider and imaging studies are consistent with end stage degenerative joint disease of the right  knee(s) and total knee arthroplasty is deemed medically necessary. The treatment options including medical management, injection therapy arthroscopy and arthroplasty were discussed at length. The risks and benefits of total knee arthroplasty were presented and reviewed. The risks due to aseptic loosening, infection, stiffness, patella tracking problems, thromboembolic complications and other imponderables were discussed. The patient acknowledged the explanation, agreed to proceed with the plan and consent was signed. Patient is being admitted for inpatient treatment for surgery, pain control, PT, OT, prophylactic antibiotics, VTE prophylaxis, progressive ambulation and ADL's and discharge planning. The patient is planning to be discharged home with home health services

## 2021-02-23 ENCOUNTER — Ambulatory Visit (HOSPITAL_COMMUNITY): Payer: 59 | Admitting: Anesthesiology

## 2021-02-23 ENCOUNTER — Encounter (HOSPITAL_COMMUNITY)
Admission: RE | Disposition: A | Discharge status: Home / Self Care | Payer: Self-pay | Source: Ambulatory Visit | Attending: Orthopaedic Surgery

## 2021-02-23 ENCOUNTER — Ambulatory Visit (HOSPITAL_COMMUNITY): Payer: 59 | Admitting: Physician Assistant

## 2021-02-23 ENCOUNTER — Other Ambulatory Visit (HOSPITAL_COMMUNITY): Payer: Self-pay

## 2021-02-23 ENCOUNTER — Ambulatory Visit (HOSPITAL_COMMUNITY)
Admission: RE | Admit: 2021-02-23 | Discharge: 2021-02-23 | Disposition: A | Discharge status: Home / Self Care | Payer: 59 | Source: Ambulatory Visit | Attending: Orthopaedic Surgery | Admitting: Orthopaedic Surgery

## 2021-02-23 ENCOUNTER — Encounter (HOSPITAL_COMMUNITY): Payer: Self-pay | Admitting: Orthopaedic Surgery

## 2021-02-23 DIAGNOSIS — Z96651 Presence of right artificial knee joint: Secondary | ICD-10-CM | POA: Diagnosis not present

## 2021-02-23 DIAGNOSIS — M238X1 Other internal derangements of right knee: Secondary | ICD-10-CM | POA: Insufficient documentation

## 2021-02-23 DIAGNOSIS — M25761 Osteophyte, right knee: Secondary | ICD-10-CM | POA: Diagnosis not present

## 2021-02-23 DIAGNOSIS — F419 Anxiety disorder, unspecified: Secondary | ICD-10-CM | POA: Diagnosis not present

## 2021-02-23 DIAGNOSIS — K219 Gastro-esophageal reflux disease without esophagitis: Secondary | ICD-10-CM | POA: Diagnosis not present

## 2021-02-23 DIAGNOSIS — M25861 Other specified joint disorders, right knee: Secondary | ICD-10-CM | POA: Diagnosis not present

## 2021-02-23 DIAGNOSIS — I10 Essential (primary) hypertension: Secondary | ICD-10-CM | POA: Diagnosis not present

## 2021-02-23 DIAGNOSIS — Z01818 Encounter for other preprocedural examination: Secondary | ICD-10-CM

## 2021-02-23 DIAGNOSIS — M25461 Effusion, right knee: Secondary | ICD-10-CM | POA: Diagnosis not present

## 2021-02-23 DIAGNOSIS — M1711 Unilateral primary osteoarthritis, right knee: Secondary | ICD-10-CM | POA: Diagnosis present

## 2021-02-23 DIAGNOSIS — G8918 Other acute postprocedural pain: Secondary | ICD-10-CM | POA: Diagnosis not present

## 2021-02-23 HISTORY — PX: TOTAL KNEE ARTHROPLASTY: SHX125

## 2021-02-23 LAB — ABO/RH: ABO/RH(D): A POS

## 2021-02-23 LAB — PREGNANCY, URINE: Preg Test, Ur: NEGATIVE

## 2021-02-23 LAB — GLUCOSE, CAPILLARY: Glucose-Capillary: 98 mg/dL (ref 70–99)

## 2021-02-23 SURGERY — ARTHROPLASTY, KNEE, TOTAL
Anesthesia: Monitor Anesthesia Care | Site: Knee | Laterality: Right

## 2021-02-23 MED ORDER — MIDAZOLAM HCL 5 MG/5ML IJ SOLN
INTRAMUSCULAR | Status: DC | PRN
  Administered 2021-02-23: 1 mg via INTRAVENOUS

## 2021-02-23 MED ORDER — DEXAMETHASONE SODIUM PHOSPHATE 10 MG/ML IJ SOLN
INTRAMUSCULAR | Status: AC
  Filled 2021-02-23: qty 1

## 2021-02-23 MED ORDER — BUPIVACAINE LIPOSOME 1.3 % IJ SUSP
INTRAMUSCULAR | Status: AC
  Filled 2021-02-23: qty 20

## 2021-02-23 MED ORDER — FENTANYL CITRATE PF 50 MCG/ML IJ SOSY
PREFILLED_SYRINGE | INTRAMUSCULAR | Status: AC
  Administered 2021-02-23: 50 ug via INTRAVENOUS
  Filled 2021-02-23: qty 2

## 2021-02-23 MED ORDER — LACTATED RINGERS IV BOLUS
500.0000 mL | Freq: Once | INTRAVENOUS | Status: AC
  Administered 2021-02-23: 500 mL via INTRAVENOUS

## 2021-02-23 MED ORDER — EPHEDRINE 5 MG/ML INJ
INTRAVENOUS | Status: AC
  Filled 2021-02-23: qty 5

## 2021-02-23 MED ORDER — 0.9 % SODIUM CHLORIDE (POUR BTL) OPTIME
TOPICAL | Status: DC | PRN
  Administered 2021-02-23: 1000 mL

## 2021-02-23 MED ORDER — LACTATED RINGERS IV SOLN: INTRAVENOUS | Status: DC

## 2021-02-23 MED ORDER — ROPIVACAINE HCL 7.5 MG/ML IJ SOLN
INTRAMUSCULAR | Status: DC | PRN
  Administered 2021-02-23: 20 mL via PERINEURAL

## 2021-02-23 MED ORDER — CHLORHEXIDINE GLUCONATE 0.12 % MT SOLN
15.0000 mL | Freq: Once | OROMUCOSAL | Status: AC
  Administered 2021-02-23: 15 mL via OROMUCOSAL

## 2021-02-23 MED ORDER — PROPOFOL 500 MG/50ML IV EMUL
INTRAVENOUS | Status: DC | PRN
  Administered 2021-02-23: 50 ug/kg/min via INTRAVENOUS

## 2021-02-23 MED ORDER — BUPIVACAINE LIPOSOME 1.3 % IJ SUSP: 20.0000 mL | Freq: Once | INTRAMUSCULAR | Status: DC

## 2021-02-23 MED ORDER — EPHEDRINE SULFATE 50 MG/ML IJ SOLN
INTRAMUSCULAR | Status: DC | PRN
  Administered 2021-02-23: 5 mg via INTRAVENOUS

## 2021-02-23 MED ORDER — TRANEXAMIC ACID 1000 MG/10ML IV SOLN
2000.0000 mg | INTRAVENOUS | Status: DC
  Filled 2021-02-23: qty 20

## 2021-02-23 MED ORDER — OXYCODONE HCL 5 MG/5ML PO SOLN: 5.0000 mg | Freq: Once | ORAL | Status: AC | PRN

## 2021-02-23 MED ORDER — CEFAZOLIN SODIUM-DEXTROSE 2-4 GM/100ML-% IV SOLN
2.0000 g | INTRAVENOUS | Status: AC
  Administered 2021-02-23: 2 g via INTRAVENOUS
  Filled 2021-02-23: qty 100

## 2021-02-23 MED ORDER — ASPIRIN EC 81 MG PO TBEC
81.0000 mg | DELAYED_RELEASE_TABLET | Freq: Two times a day (BID) | ORAL | 0 refills | Status: AC
  Filled 2021-02-23: qty 45, 23d supply, fill #0

## 2021-02-23 MED ORDER — FENTANYL CITRATE PF 50 MCG/ML IJ SOSY
50.0000 ug | PREFILLED_SYRINGE | Freq: Once | INTRAMUSCULAR | Status: AC
  Administered 2021-02-23: 50 ug via INTRAVENOUS
  Filled 2021-02-23: qty 2

## 2021-02-23 MED ORDER — TIZANIDINE HCL 4 MG PO TABS
4.0000 mg | ORAL_TABLET | Freq: Four times a day (QID) | ORAL | 1 refills | Status: DC | PRN
  Filled 2021-02-23: qty 40, 10d supply, fill #0

## 2021-02-23 MED ORDER — SODIUM CHLORIDE 0.9 % IR SOLN
Status: DC | PRN
  Administered 2021-02-23: 3000 mL

## 2021-02-23 MED ORDER — TRANEXAMIC ACID-NACL 1000-0.7 MG/100ML-% IV SOLN
1000.0000 mg | INTRAVENOUS | Status: AC
  Administered 2021-02-23: 1000 mg via INTRAVENOUS
  Filled 2021-02-23: qty 100

## 2021-02-23 MED ORDER — OXYCODONE HCL 5 MG PO TABS
ORAL_TABLET | ORAL | Status: AC
  Filled 2021-02-23: qty 1

## 2021-02-23 MED ORDER — SODIUM CHLORIDE 0.9 % IV SOLN
INTRAVENOUS | Status: DC | PRN
  Administered 2021-02-23: 80 mL

## 2021-02-23 MED ORDER — MIDAZOLAM HCL 2 MG/2ML IJ SOLN
INTRAMUSCULAR | Status: AC
  Filled 2021-02-23: qty 2

## 2021-02-23 MED ORDER — MIDAZOLAM HCL 2 MG/2ML IJ SOLN
1.0000 mg | Freq: Once | INTRAMUSCULAR | Status: AC
  Administered 2021-02-23: 2 mg via INTRAVENOUS
  Filled 2021-02-23: qty 2

## 2021-02-23 MED ORDER — ONDANSETRON HCL 4 MG/2ML IJ SOLN
INTRAMUSCULAR | Status: AC
  Filled 2021-02-23: qty 2

## 2021-02-23 MED ORDER — LACTATED RINGERS IV BOLUS: 250.0000 mL | Freq: Once | INTRAVENOUS | Status: DC

## 2021-02-23 MED ORDER — PROPOFOL 1000 MG/100ML IV EMUL
INTRAVENOUS | Status: AC
  Filled 2021-02-23: qty 100

## 2021-02-23 MED ORDER — HYDROCODONE-ACETAMINOPHEN 5-325 MG PO TABS
1.0000 | ORAL_TABLET | Freq: Four times a day (QID) | ORAL | 0 refills | Status: DC | PRN
  Filled 2021-02-23: qty 40, 5d supply, fill #0

## 2021-02-23 MED ORDER — ONDANSETRON HCL 4 MG/2ML IJ SOLN: 4.0000 mg | Freq: Four times a day (QID) | INTRAMUSCULAR | Status: DC | PRN

## 2021-02-23 MED ORDER — ORAL CARE MOUTH RINSE: 15.0000 mL | Freq: Once | OROMUCOSAL | Status: AC

## 2021-02-23 MED ORDER — POVIDONE-IODINE 10 % EX SWAB
2.0000 "application " | Freq: Once | CUTANEOUS | Status: AC
  Administered 2021-02-23: 2 via TOPICAL

## 2021-02-23 MED ORDER — FENTANYL CITRATE PF 50 MCG/ML IJ SOSY
25.0000 ug | PREFILLED_SYRINGE | INTRAMUSCULAR | Status: DC | PRN
  Administered 2021-02-23: 50 ug via INTRAVENOUS

## 2021-02-23 MED ORDER — OXYCODONE HCL 5 MG PO TABS
5.0000 mg | ORAL_TABLET | Freq: Once | ORAL | Status: AC | PRN
  Administered 2021-02-23: 5 mg via ORAL

## 2021-02-23 MED ORDER — METHOCARBAMOL 500 MG IVPB - SIMPLE MED: 500.0000 mg | Freq: Four times a day (QID) | INTRAVENOUS | Status: DC | PRN

## 2021-02-23 MED ORDER — KETAMINE HCL 10 MG/ML IJ SOLN
INTRAMUSCULAR | Status: DC | PRN
  Administered 2021-02-23: 20 mg via INTRAVENOUS

## 2021-02-23 MED ORDER — LIDOCAINE HCL (CARDIAC) PF 100 MG/5ML IV SOSY
PREFILLED_SYRINGE | INTRAVENOUS | Status: DC | PRN
  Administered 2021-02-23: 30 mg via INTRAVENOUS
  Administered 2021-02-23: 10 mg via INTRAVENOUS

## 2021-02-23 MED ORDER — LACTATED RINGERS IV BOLUS
250.0000 mL | Freq: Once | INTRAVENOUS | Status: AC
  Administered 2021-02-23: 250 mL via INTRAVENOUS

## 2021-02-23 MED ORDER — METHOCARBAMOL 500 MG PO TABS: 500.0000 mg | ORAL_TABLET | Freq: Four times a day (QID) | ORAL | Status: DC | PRN

## 2021-02-23 MED ORDER — FENTANYL CITRATE (PF) 100 MCG/2ML IJ SOLN
INTRAMUSCULAR | Status: DC | PRN
  Administered 2021-02-23: 50 ug via INTRAVENOUS

## 2021-02-23 MED ORDER — LIDOCAINE HCL (PF) 2 % IJ SOLN
INTRAMUSCULAR | Status: AC
  Filled 2021-02-23: qty 5

## 2021-02-23 MED ORDER — ONDANSETRON HCL 4 MG/2ML IJ SOLN
INTRAMUSCULAR | Status: DC | PRN
  Administered 2021-02-23: 4 mg via INTRAVENOUS

## 2021-02-23 MED ORDER — FENTANYL CITRATE (PF) 100 MCG/2ML IJ SOLN
INTRAMUSCULAR | Status: AC
  Filled 2021-02-23: qty 2

## 2021-02-23 MED ORDER — BUPIVACAINE-EPINEPHRINE 0.5% -1:200000 IJ SOLN
INTRAMUSCULAR | Status: AC
  Filled 2021-02-23: qty 1

## 2021-02-23 MED ORDER — KETAMINE HCL 10 MG/ML IJ SOLN
INTRAMUSCULAR | Status: AC
  Filled 2021-02-23: qty 1

## 2021-02-23 MED ORDER — BUPIVACAINE IN DEXTROSE 0.75-8.25 % IT SOLN
INTRATHECAL | Status: DC | PRN
  Administered 2021-02-23: 2 mg via INTRATHECAL

## 2021-02-23 MED ORDER — DEXAMETHASONE SODIUM PHOSPHATE 10 MG/ML IJ SOLN
INTRAMUSCULAR | Status: DC | PRN
  Administered 2021-02-23: 10 mg via INTRAVENOUS

## 2021-02-23 MED ORDER — SODIUM CHLORIDE (PF) 0.9 % IJ SOLN
INTRAMUSCULAR | Status: AC
  Filled 2021-02-23: qty 30

## 2021-02-23 MED ORDER — TRANEXAMIC ACID 1000 MG/10ML IV SOLN
INTRAVENOUS | Status: DC | PRN
  Administered 2021-02-23: 2000 mg via TOPICAL

## 2021-02-23 SURGICAL SUPPLY — 60 items
ATTUNE PSFEM RTSZ5 NARCEM KNEE (Femur) ×2 IMPLANT
ATTUNE PSRP INSR SZ5 6 KNEE (Insert) ×1 IMPLANT
ATTUNE PSRP INSR SZ5 6MM KNEE (Insert) ×1 IMPLANT
BAG COUNTER SPONGE SURGICOUNT (BAG) ×2 IMPLANT
BAG DECANTER FOR FLEXI CONT (MISCELLANEOUS) ×3 IMPLANT
BAG SPEC THK2 15X12 ZIP CLS (MISCELLANEOUS) ×1
BAG SPNG CNTER NS LX DISP (BAG) ×1
BAG SURGICOUNT SPONGE COUNTING (BAG) ×1
BAG ZIPLOCK 12X15 (MISCELLANEOUS) ×3 IMPLANT
BASE TIBIAL ROT PLAT SZ 5 KNEE (Knees) IMPLANT
BLADE SAGITTAL 25.0X1.19X90 (BLADE) ×2 IMPLANT
BLADE SAGITTAL 25.0X1.19X90MM (BLADE) ×1
BLADE SAW SGTL 11.0X1.19X90.0M (BLADE) ×3 IMPLANT
BLADE SURG SZ10 CARB STEEL (BLADE) ×3 IMPLANT
BNDG ELASTIC 6X5.8 VLCR STR LF (GAUZE/BANDAGES/DRESSINGS) ×3 IMPLANT
BOOTIES KNEE HIGH SLOAN (MISCELLANEOUS) ×3 IMPLANT
BOWL SMART MIX CTS (DISPOSABLE) ×3 IMPLANT
BSPLAT TIB 5 CMNT ROT PLAT STR (Knees) ×1 IMPLANT
CEMENT HV SMART SET (Cement) ×6 IMPLANT
COVER SURGICAL LIGHT HANDLE (MISCELLANEOUS) ×3 IMPLANT
CUFF TOURN SGL QUICK 34 (TOURNIQUET CUFF) ×3
CUFF TRNQT CYL 34X4.125X (TOURNIQUET CUFF) ×1 IMPLANT
DECANTER SPIKE VIAL GLASS SM (MISCELLANEOUS) ×2 IMPLANT
DRAPE INCISE IOBAN 66X45 STRL (DRAPES) ×3 IMPLANT
DRAPE ORTHO 2.5IN SPLIT 77X108 (DRAPES) ×1 IMPLANT
DRAPE ORTHO SPLIT 77X108 STRL (DRAPES) ×3
DRAPE SHEET LG 3/4 BI-LAMINATE (DRAPES) ×3 IMPLANT
DRAPE U-SHAPE 47X51 STRL (DRAPES) ×3 IMPLANT
DRSG AQUACEL AG ADV 3.5X10 (GAUZE/BANDAGES/DRESSINGS) ×3 IMPLANT
DURAPREP 26ML APPLICATOR (WOUND CARE) ×6 IMPLANT
ELECT REM PT RETURN 15FT ADLT (MISCELLANEOUS) ×3 IMPLANT
GLOVE SRG 8 PF TXTR STRL LF DI (GLOVE) ×2 IMPLANT
GLOVE SURG ENC MOIS LTX SZ8 (GLOVE) ×6 IMPLANT
GLOVE SURG UNDER POLY LF SZ8 (GLOVE) ×6
GOWN STRL REUS W/TWL XL LVL3 (GOWN DISPOSABLE) ×6 IMPLANT
HANDPIECE INTERPULSE COAX TIP (DISPOSABLE) ×3
HOLDER FOLEY CATH W/STRAP (MISCELLANEOUS) ×2 IMPLANT
HOOD PEEL AWAY FLYTE STAYCOOL (MISCELLANEOUS) ×9 IMPLANT
KIT TURNOVER KIT A (KITS) IMPLANT
MANIFOLD NEPTUNE II (INSTRUMENTS) ×3 IMPLANT
NEEDLE HYPO 22GX1.5 SAFETY (NEEDLE) ×3 IMPLANT
NS IRRIG 1000ML POUR BTL (IV SOLUTION) ×3 IMPLANT
PACK TOTAL KNEE CUSTOM (KITS) ×3 IMPLANT
PAD ARMBOARD 7.5X6 YLW CONV (MISCELLANEOUS) ×3 IMPLANT
PATELLA MEDIAL ATTUN 35MM KNEE (Knees) ×2 IMPLANT
PROTECTOR NERVE ULNAR (MISCELLANEOUS) ×3 IMPLANT
SET HNDPC FAN SPRY TIP SCT (DISPOSABLE) ×1 IMPLANT
SPONGE T-LAP 18X18 ~~LOC~~+RFID (SPONGE) ×4 IMPLANT
SUT ETHIBOND NAB CT1 #1 30IN (SUTURE) ×6 IMPLANT
SUT VIC AB 0 CT1 36 (SUTURE) ×3 IMPLANT
SUT VIC AB 2-0 CT1 27 (SUTURE) ×3
SUT VIC AB 2-0 CT1 TAPERPNT 27 (SUTURE) ×1 IMPLANT
SUT VICRYL AB 3-0 FS1 BRD 27IN (SUTURE) ×3 IMPLANT
SUT VLOC 180 0 24IN GS25 (SUTURE) ×3 IMPLANT
TIBIAL BASE ROT PLAT SZ 5 KNEE (Knees) ×3 IMPLANT
TRAY FOLEY MTR SLVR 14FR STAT (SET/KITS/TRAYS/PACK) ×2 IMPLANT
TRAY FOLEY MTR SLVR 16FR STAT (SET/KITS/TRAYS/PACK) IMPLANT
WATER STERILE IRR 1000ML POUR (IV SOLUTION) ×5 IMPLANT
WRAP KNEE MAXI GEL POST OP (GAUZE/BANDAGES/DRESSINGS) ×3 IMPLANT
YANKAUER SUCT BULB TIP NO VENT (SUCTIONS) ×3 IMPLANT

## 2021-02-23 NOTE — Anesthesia Procedure Notes (Signed)
Spinal  Start time: 02/23/2021 10:08 AM End time: 02/23/2021 10:13 AM Reason for block: surgical anesthesia Staffing Performed: resident/CRNA  Resident/CRNA: Garrel Ridgel, CRNA Preanesthetic Checklist Completed: patient identified, IV checked, site marked, risks and benefits discussed, surgical consent, monitors and equipment checked, pre-op evaluation and timeout performed Spinal Block Patient position: sitting Prep: DuraPrep Patient monitoring: heart rate, cardiac monitor, continuous pulse ox and blood pressure Approach: midline Location: L4-5 Injection technique: single-shot Needle Needle type: Pencan  Needle gauge: 24 G Needle length: 9 cm Needle insertion depth: 4 cm

## 2021-02-23 NOTE — Progress Notes (Signed)
Assisted Dr. Albertha Ghee with Right Knee Adductor Canal block. Side rails up, monitors on throughout procedure. See vital signs in flow sheet. Tolerated Procedure well.

## 2021-02-23 NOTE — Interval H&P Note (Signed)
History and Physical Interval Note:  02/23/2021 9:11 AM  Kimberly Boyd  has presented today for surgery, with the diagnosis of RIGHT KNEE DEGENERATIVE JOINT DISEASE.  The various methods of treatment have been discussed with the patient and family. After consideration of risks, benefits and other options for treatment, the patient has consented to  Procedure(s): RIGHT TOTAL KNEE ARTHROPLASTY (Right) as a surgical intervention.  The patient's history has been reviewed, patient examined, no change in status, stable for surgery.  I have reviewed the patient's chart and labs.  Questions were answered to the patient's satisfaction.     Hessie Dibble

## 2021-02-23 NOTE — Transfer of Care (Signed)
Immediate Anesthesia Transfer of Care Note  Patient: Kimberly Boyd  Procedure(s) Performed: RIGHT TOTAL KNEE ARTHROPLASTY (Right: Knee)  Patient Location: PACU  Anesthesia Type:Spinal  Level of Consciousness: awake, alert , oriented and patient cooperative  Airway & Oxygen Therapy: Patient Spontanous Breathing and Patient connected to face mask oxygen  Post-op Assessment: Report given to RN and Post -op Vital signs reviewed and stable  Post vital signs: Reviewed and stable  Last Vitals:  Vitals Value Taken Time  BP 113/72 02/23/21 1212  Temp    Pulse 67 02/23/21 1213  Resp 10 02/23/21 1213  SpO2 100 % 02/23/21 1213  Vitals shown include unvalidated device data.  Last Pain:  Vitals:   02/23/21 0814  TempSrc:   PainSc: 0-No pain         Complications: No notable events documented.

## 2021-02-23 NOTE — Anesthesia Procedure Notes (Signed)
Anesthesia Regional Block: Adductor canal block   Pre-Anesthetic Checklist: , timeout performed,  Correct Patient, Correct Site, Correct Laterality,  Correct Procedure, Correct Position, site marked,  Risks and benefits discussed,  Surgical consent,  Pre-op evaluation,  At surgeon's request and post-op pain management  Laterality: Right  Prep: chloraprep       Needles:  Injection technique: Single-shot  Needle Type: Echogenic Needle     Needle Length: 9cm  Needle Gauge: 21     Additional Needles:   Narrative:  Start time: 02/23/2021 9:03 AM End time: 02/23/2021 9:11 AM Injection made incrementally with aspirations every 5 mL.  Performed by: Personally  Anesthesiologist: Albertha Ghee, MD  Additional Notes: Pt tolerated the procedure well.

## 2021-02-23 NOTE — Evaluation (Signed)
Physical Therapy Evaluation Patient Details Name: Kimberly Boyd MRN: [id number] DOB: [birth year] Today's Date: 02/23/2021  History of Present Illness  Patient is 55 y.o. female s/p Rt TKA on 02/23/21 with PMH significant for anemia, OA, GERD, HTN, SVT.  Clinical Impression  Kimberly Boyd is a 55 y.o. female POD 0 s/p Rt TKA. Patient reports independence with mobility at baseline. Patient is now limited by functional impairments (see PT problem list below) and requires min guard/supervision for transfers and gait with RW. Patient was able to ambulate ~90 feet with RW and min guard/supervision and cues for safe walker management. Patient educated on safe sequencing for stair mobility and verbalized safe guarding position for people assisting with mobility. Patient instructed in exercises to facilitate ROM and circulation. Patient will benefit from continued skilled PT interventions to address impairments and progress towards PLOF. Patient has met mobility goals at adequate level for discharge home; will continue to follow if pt continues acute stay to progress towards Mod I goals.        Recommendations for follow up therapy are one component of a multi-disciplinary discharge planning process, led by the attending physician.  Recommendations may be updated based on patient status, additional functional criteria and insurance authorization.  Follow Up Recommendations Follow physician's recommendations for discharge plan and follow up therapies    Assistance Recommended at Discharge Intermittent Supervision/Assistance  Functional Status Assessment Patient has had a recent decline in their functional status and demonstrates the ability to make significant improvements in function in a reasonable and predictable amount of time.  Equipment Recommendations  None recommended by PT    Recommendations for Other Services       Precautions / Restrictions Precautions Precautions:  Fall Restrictions Weight Bearing Restrictions: No Other Position/Activity Restrictions: WBAT      Mobility  Bed Mobility Overal bed mobility: Needs Assistance Bed Mobility: Supine to Sit     Supine to sit: Min guard;Supervision     General bed mobility comments: pt taking extra time, cues to use belt for Rt LE to EOB if needed.    Transfers Overall transfer level: Needs assistance Equipment used: Rolling walker (2 wheels) Transfers: Sit to/from Stand Sit to Stand: Min guard;Supervision           General transfer comment: cues for hand placement and technique with RW for power up from EOB and toilet.    Ambulation/Gait Ambulation/Gait assistance: Min guard;Supervision Gait Distance (Feet): 90 Feet Assistive device: Rolling walker (2 wheels) Gait Pattern/deviations: Step-to pattern;Decreased stride length;Decreased weight shift to right Gait velocity: decr     General Gait Details: cues for step to pattern and proximity to RW, pt maintained throughout with no buckling and no LOB noted.  Stairs Stairs: Yes Stairs assistance: Min guard;Supervision Stair Management: No rails;Step to pattern;Forwards;With walker Number of Stairs: 6 (2x3) General stair comments: cues for step sequencing and walker position throughout, "up with good, down with bad" and cues for family guarding position. pt verbalized understanding.  Wheelchair Mobility    Modified Rankin (Stroke Patients Only)       Balance Overall balance assessment: Needs assistance Sitting-balance support: Feet supported Sitting balance-Leahy Scale: Good                                       Pertinent Vitals/Pain Pain Assessment: 0-10 Pain Score: 4  Pain Location: Rt knee Pain Descriptors / Indicators: Aching;Discomfort  Pain Intervention(s): Limited activity within patient's tolerance;Monitored during session;Repositioned;Ice applied    Home Living Family/patient expects to be  discharged to:: Private residence Living Arrangements: Spouse/significant other;Children Available Help at Discharge: Family Type of Home: House Home Access: Stairs to enter Entrance Stairs-Rails: None Technical brewer of Steps: 2   Home Layout: One level Home Equipment: Conservation officer, nature (2 wheels);BSC/3in1;Shower seat      Prior Function Prior Level of Function : Independent/Modified Independent                     Hand Dominance   Dominant Hand: Right    Extremity/Trunk Assessment   Upper Extremity Assessment Upper Extremity Assessment: Overall WFL for tasks assessed    Lower Extremity Assessment Lower Extremity Assessment: RLE deficits/detail RLE Deficits / Details: good quad activation, no extensor lag with SLR RLE Sensation: WNL RLE Coordination: WNL    Cervical / Trunk Assessment Cervical / Trunk Assessment: Normal  Communication   Communication: No difficulties  Cognition Arousal/Alertness: Awake/alert Behavior During Therapy: WFL for tasks assessed/performed Overall Cognitive Status: Within Functional Limits for tasks assessed                                          General Comments      Exercises Total Joint Exercises Ankle Circles/Pumps: AROM;Both;20 reps;Seated Quad Sets: AROM;5 reps;Seated;Right Heel Slides: AROM;5 reps;Seated;Right   Assessment/Plan    PT Assessment Patient needs continued PT services  PT Problem List Decreased strength;Decreased range of motion;Decreased activity tolerance;Decreased balance;Decreased mobility;Decreased knowledge of use of DME;Decreased knowledge of precautions;Pain       PT Treatment Interventions DME instruction;Gait training;Stair training;Functional mobility training;Therapeutic activities;Therapeutic exercise;Balance training;Patient/family education    PT Goals (Current goals can be found in the Care Plan section)  Acute Rehab PT Goals Patient Stated Goal: get home and  recover PT Goal Formulation: With patient Time For Goal Achievement: 03/02/21 Potential to Achieve Goals: Good    Frequency 7X/week   Barriers to discharge        Co-evaluation               AM-PAC PT "6 Clicks" Mobility  Outcome Measure Help needed turning from your back to your side while in a flat bed without using bedrails?: A Little Help needed moving from lying on your back to sitting on the side of a flat bed without using bedrails?: A Little Help needed moving to and from a bed to a chair (including a wheelchair)?: A Little Help needed standing up from a chair using your arms (e.g., wheelchair or bedside chair)?: A Little Help needed to walk in hospital room?: A Little Help needed climbing 3-5 steps with a railing? : A Little 6 Click Score: 18    End of Session Equipment Utilized During Treatment: Gait belt Activity Tolerance: Patient tolerated treatment well Patient left: in chair;with call bell/phone within reach Nurse Communication: Mobility status PT Visit Diagnosis: Muscle weakness (generalized) (M62.81);Difficulty in walking, not elsewhere classified (R26.2)    Time: 5537-4827 PT Time Calculation (min) (ACUTE ONLY): 41 min   Charges:   PT Evaluation $PT Eval Low Complexity: 1 Low PT Treatments $Gait Training: 8-22 mins $Therapeutic Exercise: 8-22 mins        Verner Mould, DPT Acute Rehabilitation Services Office [phone] Pager [phone]   Jacques Navy 02/23/2021, 6:02 PM

## 2021-02-23 NOTE — Anesthesia Preprocedure Evaluation (Signed)
Anesthesia Evaluation  Patient identified by MRN, date of birth, ID band Patient awake    Reviewed: Allergy & Precautions, H&P , NPO status , Patient's Chart, lab work & pertinent test results  Airway Mallampati: II   Neck ROM: full    Dental   Pulmonary neg pulmonary ROS,    breath sounds clear to auscultation       Cardiovascular hypertension,  Rhythm:regular Rate:Normal     Neuro/Psych PSYCHIATRIC DISORDERS Anxiety    GI/Hepatic GERD  ,  Endo/Other    Renal/GU      Musculoskeletal  (+) Arthritis ,   Abdominal   Peds  Hematology   Anesthesia Other Findings   Reproductive/Obstetrics                             Anesthesia Physical Anesthesia Plan  ASA: 2  Anesthesia Plan: Spinal and MAC   Post-op Pain Management: Regional block   Induction: Intravenous  PONV Risk Score and Plan: 2 and Propofol infusion, Ondansetron, Treatment may vary due to age or medical condition and Midazolam  Airway Management Planned: Simple Face Mask  Additional Equipment:   Intra-op Plan:   Post-operative Plan:   Informed Consent: I have reviewed the patients History and Physical, chart, labs and discussed the procedure including the risks, benefits and alternatives for the proposed anesthesia with the patient or authorized representative who has indicated his/her understanding and acceptance.     Dental advisory given  Plan Discussed with: CRNA, Anesthesiologist and Surgeon  Anesthesia Plan Comments:         Anesthesia Quick Evaluation

## 2021-02-23 NOTE — Op Note (Signed)
PREOP DIAGNOSIS: DJD RIGHT KNEE POSTOP DIAGNOSIS: same PROCEDURE: RIGHT TKR ANESTHESIA: Spinal and MAC ATTENDING SURGEON: Hessie Dibble ASSISTANT: Loni Dolly PA  INDICATIONS FOR PROCEDURE: Kimberly Boyd is a 55 y.o. female who has struggled for a long time with pain due to degenerative arthritis of the right knee.  The patient has failed many conservative non-operative measures and at this point has pain which limits the ability to sleep and walk.  The patient is offered total knee replacement.  Informed operative consent was obtained after discussion of possible risks of anesthesia, infection, neurovascular injury, DVT, and death.  The importance of the post-operative rehabilitation protocol to optimize result was stressed extensively with the patient.  SUMMARY OF FINDINGS AND PROCEDURE:  NYAIRA HODGENS was taken to the operative suite where under the above anesthesia a right knee replacement was performed.  There were advanced degenerative changes and the bone quality was excellent.  We used the DePuy Attune system and placed size 5 narrow femur, 5 tibia, 35 mm all polyethylene patella, and a size 6 mm spacer.  Loni Dolly PA-C assisted throughout and was invaluable to the completion of the case in that he helped retract and maintain exposure while I placed components.  He also helped close thereby minimizing OR time.  The patient was admitted for appropriate post-op care to include perioperative antibiotics and mechanical and pharmacologic measures for DVT prophylaxis.  DESCRIPTION OF PROCEDURE:  NAWAL BURLING was taken to the operative suite where the above anesthesia was applied.  The patient was positioned supine and prepped and draped in normal sterile fashion.  An appropriate time out was performed.  After the administration of kefzol pre-op antibiotic the leg was elevated and exsanguinated and a tourniquet inflated. A standard longitudinal incision was made on the anterior knee.   Dissection was carried down to the extensor mechanism.  All appropriate anti-infective measures were used including the pre-operative antibiotic, betadine impregnated drape, and closed hooded exhaust systems for each member of the surgical team.  A medial parapatellar incision was made in the extensor mechanism and the knee cap flipped and the knee flexed.  Some residual meniscal tissues were removed along with any remaining ACL/PCL tissue.  A guide was placed on the tibia and a flat cut was made on it's superior surface.  An intramedullary guide was placed in the femur and was utilized to make anterior and posterior cuts creating an appropriate flexion gap.  A second intramedullary guide was placed in the femur to make a distal cut properly balancing the knee with an extension gap equal to the flexion gap.  The three bones sized to the above mentioned sizes and the appropriate guides were placed and utilized.  A trial reduction was done and the knee easily came to full extension and the patella tracked well on flexion.  The trial components were removed and all bones were cleaned with pulsatile lavage and then dried thoroughly.  Cement was mixed and was pressurized onto the bones followed by placement of the aforementioned components.  Excess cement was trimmed and pressure was held on the components until the cement had hardened.  The tourniquet was deflated and a small amount of bleeding was controlled with cautery and pressure.  The knee was irrigated thoroughly.  The extensor mechanism was re-approximated with #1 ethibond in interrupted fashion.  The knee was flexed and the repair was solid.  The subcutaneous tissues were re-approximated with #0 and #2-0 vicryl and the skin closed  with a subcuticular stitch and steristrips.  A sterile dressing was applied.  Intraoperative fluids, EBL, and tourniquet time can be obtained from anesthesia records.  DISPOSITION:  The patient was taken to recovery room in stable  condition and scheduled to potentially go home same day depending on ability to walk and tolerate liquids.Monico Blitz Jesicca Dipierro 02/23/2021, 11:41 AM

## 2021-02-23 NOTE — Anesthesia Postprocedure Evaluation (Signed)
Anesthesia Post Note  Patient: Kimberly Boyd  Procedure(s) Performed: RIGHT TOTAL KNEE ARTHROPLASTY (Right: Knee)     Patient location during evaluation: PACU Anesthesia Type: MAC Level of consciousness: oriented and awake and alert Pain management: pain level controlled Vital Signs Assessment: post-procedure vital signs reviewed and stable Respiratory status: spontaneous breathing and respiratory function stable Cardiovascular status: blood pressure returned to baseline and stable Postop Assessment: no headache, no backache, no apparent nausea or vomiting and patient able to bend at knees Anesthetic complications: no   No notable events documented.  Last Vitals:  Vitals:   02/23/21 1315 02/23/21 1330  BP: 121/79   Pulse: 72 73  Resp: 19 18  Temp: 36.7 C   SpO2: 99% 100%    Last Pain:  Vitals:   02/23/21 1215  TempSrc:   PainSc: 0-No pain                 Merlinda Frederick

## 2021-02-24 ENCOUNTER — Encounter (HOSPITAL_COMMUNITY): Payer: Self-pay | Admitting: Orthopaedic Surgery

## 2021-02-25 ENCOUNTER — Ambulatory Visit: Payer: 59 | Attending: Orthopaedic Surgery | Admitting: Physical Therapy

## 2021-02-25 ENCOUNTER — Other Ambulatory Visit (HOSPITAL_COMMUNITY): Payer: Self-pay

## 2021-02-25 ENCOUNTER — Encounter: Payer: Self-pay | Admitting: Physical Therapy

## 2021-02-25 ENCOUNTER — Other Ambulatory Visit: Payer: Self-pay

## 2021-02-25 DIAGNOSIS — R262 Difficulty in walking, not elsewhere classified: Secondary | ICD-10-CM | POA: Insufficient documentation

## 2021-02-25 DIAGNOSIS — M25561 Pain in right knee: Secondary | ICD-10-CM | POA: Insufficient documentation

## 2021-02-25 DIAGNOSIS — R600 Localized edema: Secondary | ICD-10-CM | POA: Diagnosis not present

## 2021-02-25 DIAGNOSIS — M6281 Muscle weakness (generalized): Secondary | ICD-10-CM | POA: Insufficient documentation

## 2021-02-25 DIAGNOSIS — M25661 Stiffness of right knee, not elsewhere classified: Secondary | ICD-10-CM | POA: Diagnosis not present

## 2021-02-25 DIAGNOSIS — E876 Hypokalemia: Secondary | ICD-10-CM | POA: Diagnosis not present

## 2021-02-25 DIAGNOSIS — Z79899 Other long term (current) drug therapy: Secondary | ICD-10-CM | POA: Diagnosis not present

## 2021-02-25 MED ORDER — OXYCODONE-ACETAMINOPHEN 5-325 MG PO TABS
ORAL_TABLET | ORAL | 0 refills | Status: DC
  Filled 2021-02-25: qty 40, 5d supply, fill #0

## 2021-02-25 NOTE — Therapy (Signed)
[facility] [address]  [place], [place], [address] Phone: [phone]   Fax:  [phone]  Physical Therapy Evaluation  Patient Details  Name: Kimberly Boyd MRN: [id number] Date of Birth: 1965/12/30 Referring Provider (PT): Melrose Nakayama MD   Encounter Date: 02/25/2021   PT End of Session - 02/25/21 1416     Visit Number 1    Number of Visits 18    Date for PT Re-Evaluation 04/08/21    Authorization Type [facility] VL [id number]    PT Start Time 0347    PT Stop Time 1408    PT Time Calculation (min) 51 min    Activity Tolerance Patient tolerated treatment well    Behavior During Therapy WFL for tasks assessed/performed             Past Medical History:  Diagnosis Date   Anemia    Anxiety    Arthritis    Arthritis of left foot    Arthritis of right knee    Back pain years.    chronic low back pain    Chronic back pain    Complication of anesthesia    tachycardia, difficulty waking up   GERD (gastroesophageal reflux disease)    Heart murmur    questionable   Hypertension    Obesity    Palpitations    Pre-diabetes    SVT (supraventricular tachycardia) (HCC)     Past Surgical History:  Procedure Laterality Date   BREAST BIOPSY Right    Benign approx age 69   cervical dysplasia laser     dermoid cyst removal     IR GENERIC HISTORICAL  06/04/2014   IR RADIOLOGIST EVAL & MGMT 06/04/2014 Sandi Mariscal, MD [facility] INTERV RAD   LAPAROSCOPIC CHOLECYSTECTOMY     LIPOMA RESECTION     and redone   ORIF ANKLE FRACTURE  08/09/2011   Procedure: OPEN REDUCTION INTERNAL FIXATION (ORIF) ANKLE FRACTURE;  Surgeon: Wylene Simmer, MD;  Location: [facility];  Service: Orthopedics;  Laterality: Right;  ORIF Right Ankle Lateral Malleolus   scar revision for the lipoma     TOTAL KNEE ARTHROPLASTY Right 02/23/2021   Procedure: RIGHT TOTAL KNEE ARTHROPLASTY;  Surgeon: Melrose Nakayama, MD;  Location: [facility];  Service: Orthopedics;   Laterality: Right;   WISDOM TOOTH EXTRACTION      There were no vitals filed for this visit.    Subjective Assessment - 02/25/21 1322     Subjective Pt had R TKA on 02/23/21.  She reports more breakthrough pain now that block has worn off.  She reports good compliance with her HEP from hospital.    Pertinent History PMH - pre-diabetes, obesity, palpitations (SVT)    Diagnostic tests X-ray    Patient Stated Goals walk completely normal, return to exercise    Currently in Pain? Yes    Pain Score 5     Pain Location Knee    Pain Orientation Right    Pain Descriptors / Indicators Aching    Pain Type Surgical pain                [facility] PT Assessment - 02/25/21 0001       Assessment   Medical Diagnosis Z96.659 (ICD-10-CM) - S/P total knee replacement    Referring Provider (PT) Melrose Nakayama MD    Onset Date/Surgical Date 02/23/21    Hand Dominance Left    Next MD Visit in 2 weeks ?03/09/21  Prior Therapy 1 prehab visit      Precautions   Precautions None      Restrictions   Weight Bearing Restrictions No      Balance Screen   Has the patient fallen in the past 6 months Yes    How many times? 1   stepped on rock in driveway   Has the patient had a decrease in activity level because of a fear of falling?  No    Is the patient reluctant to leave their home because of a fear of falling?  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    Skin Integrity Incision covered with hydrocolloid bandage, no signs or symptoms of infection    Focus on Therapeutic Outcomes (FOTO)  22   predicted 66 after 18 visits     Observation/Other Assessments-Edema    Edema --   around R knee, expected     ROM / Strength   AROM / PROM / Strength AROM;PROM;Strength      AROM   Overall AROM  Deficits    AROM Assessment Site Knee    Right/Left Knee Right;Left    Right Knee Extension -45    Right Knee Flexion 15    Left Knee Extension 0    Left Knee Flexion 115      PROM   Overall PROM  Deficits    Overall PROM Comments tested in supine    PROM Assessment Site Knee    Right/Left Knee Right    Right Knee Extension 15    Right Knee Flexion 78      Strength   Overall Strength Deficits    Overall Strength Comments very painful, guarded LLE, quad lag.    Strength Assessment Site Hip;Knee    Right/Left Hip Right;Left    Right Hip Flexion 3-/5    Right Hip ABduction 4+/5    Right Hip ADduction 4+/5    Left Hip Flexion 5/5    Left Hip ABduction 5/5    Left Hip ADduction 5/5    Right/Left Knee Right;Left    Right Knee Flexion 2/5    Right Knee Extension 2/5    Left Knee Flexion 5/5    Left Knee Extension 5/5      Ambulation/Gait   Ambulation/Gait Yes    Ambulation/Gait Assistance 6: Modified independent (Device/Increase time)    Ambulation Distance (Feet) 50 Feet    Assistive device Rolling walker    Gait Pattern Step-to pattern    Gait Comments instructed stepping up and down aerobic step with 1 riser - "up with the good, down with the bad" able to perform safely with RW  Objective measurements completed on examination: See above findings.       [facility] Adult PT Treatment/Exercise - 02/25/21 0001       Self-Care   Self-Care Other Self-Care Comments;Heat/Ice Application     Heat/Ice Application apply frequently, ok to use CP issued, with incision covered.    Other Self-Care Comments  continue with compression stockings, do not remove hydrocolloid bandage, education on height of walker, adjusted, and how to ascend/descend steps safely.      Exercises   Exercises Knee/Hip      Knee/Hip Exercises: Seated   Long Arc Quad Right;5 reps    Long Arc Quad Limitations with ankle pump    Other Seated Knee/Hip Exercises reviewed seated quad set    Sit to [employer] 5 reps      Knee/Hip Exercises: Supine   Quad Sets Right;5 reps    Quad Sets Limitations towel under knee for tactile cues    Heel Slides Right;5 reps    Heel Slides Limitations reviewed, with strap    Bridges 5 reps    Straight Leg Raises Limitations unable due to quad lag    Other Supine Knee/Hip Exercises R hip abduction x 5                     PT Education - 02/25/21 1414     Education Details Access Code: [place] and progressed HEP, educated on plan of care, signs/symptoms of infection, gait training on stairs "up with the good, down with the bad", adjusted walker.  continue using CP at home frequently, move frequently as possible, can start with bike at home also partial revolutions.    Person(s) Educated Patient    Methods Explanation;Demonstration;Verbal cues;Handout    Comprehension Verbalized understanding;Returned demonstration              PT Short Term Goals - 02/25/21 1430       PT SHORT TERM GOAL #1   Title Patient to be independent with initial HEP.    Time 2    Period Weeks    Status New    Target Date 03/11/21               PT Long Term Goals - 02/25/21 1430       PT LONG TERM GOAL #1   Title Patient to be independent with advanced HEP.    Time 6    Period Weeks    Status New    Target Date 04/08/21      PT LONG TERM GOAL #2   Title Pt. will demonstrate 0-120 deg R knee ROM for safety with gait and stairs.    Baseline 15-78    Time 6     Period Weeks    Status New    Target Date 04/08/21      PT LONG TERM GOAL #3   Title Pt. will demonstrate 5/5 RLE strength for safety with gait.    Baseline significant R quad lag    Time 6    Period Weeks    Status New    Target Date 04/08/21      PT LONG TERM GOAL #4   Title Patient will be able to ambulate 900' without AD and with good safety for community ambulation.    Baseline slow antalgic gait with 2WRW    Time 6    Period Weeks    Status New    Target Date 04/08/21  PT LONG TERM GOAL #5   Title Pt. will be able to ascend/descend 1 flight of stairs with 1 HR and good safety using reciprocal step to access home.    Baseline unable    Time 6    Period Weeks    Status New    Target Date 04/08/21                    Plan - 02/25/21 1416     Clinical Impression Statement Kimberly Boyd is 55 year old female s/p R TKA on 02/23/21.  She demonstrates decreased R knee ROM (15-78 deg), pain, swelling and decreased strength, with 30 deg of quad lag, as expected after surgery.  She had no signs of infection and calf squeeze was negative.  Today focused on reviewing her initial HEP, adding sit to stands and recommended start partial revolutions on bike at home as tolerated.  Adjusted her RW and instructed on ascending/descending stairs "up with the good, down with the bad" which she reported felt much safer.  She would benefit from skilled physical therapy to decrease R knee pain, improve gait, strength and ROM, and allow full return to all desired activitites.    Personal Factors and Comorbidities Comorbidity 2    Comorbidities HTN, history SVT, pre-diabetes    Examination-Activity Limitations Bed Mobility;Bend;Carry;Stand;Stairs;Squat;Sit;Transfers;Locomotion Level    Examination-Participation Restrictions Occupation;Cleaning;Laundry;Yard Work;Community Activity;Driving;Meal Prep;Shop    Stability/Clinical Decision Making Stable/Uncomplicated    Clinical Decision Making  Low    Rehab Potential Excellent    PT Frequency Other (comment)   2-3x/week   PT Duration 6 weeks    PT Treatment/Interventions ADLs/Self Care Home Management;Cryotherapy;Electrical Stimulation;Gait training;Stair training;Functional mobility training;Therapeutic activities;Therapeutic exercise;Balance training;Neuromuscular re-education;Patient/family education;Manual techniques;Scar mobilization;Passive range of motion;Dry needling;Taping;Vasopneumatic Device;Joint Manipulations    PT Next Visit Plan progress strengthening and ROM, modalities PRN    PT Home Exercise Plan Access Code: [email or web address]    Consulted and Agree with Plan of Care Patient             Patient will benefit from skilled therapeutic intervention in order to improve the following deficits and impairments:  Abnormal gait, Decreased endurance, Decreased skin integrity, Difficulty walking, Increased muscle spasms, Decreased range of motion, Decreased scar mobility, Increased edema, Decreased activity tolerance, Decreased strength, Increased fascial restricitons, Impaired flexibility, Pain, Decreased balance, Decreased mobility  Visit Diagnosis: Acute pain of right knee  Stiffness of right knee, not elsewhere classified  Localized edema  Muscle weakness (generalized)  Difficulty in walking, not elsewhere classified     Problem List Patient Active Problem List   Diagnosis Date Noted   Primary osteoarthritis of right knee 02/23/2021   Other hyperlipidemia 12/22/2020   Prediabetes 05/13/2020   Arthritis of right knee 05/13/2020   Asymptomatic varicose veins of bilateral lower extremities 05/13/2020   Dysplastic nevus 05/13/2020   Elevated erythrocyte sedimentation rate 05/13/2020   Gout 05/13/2020   Gouty arthropathy 05/13/2020   Hot flashes 05/13/2020   Hyperglycemia 05/13/2020   Insomnia 05/13/2020   Menopause 05/13/2020   Iron deficiency anemia due to chronic blood loss 05/13/2020   Pica 05/13/2020   Pure  hypercholesterolemia 05/13/2020   Right knee pain 05/13/2020   Sacroiliitis, not elsewhere classified ([place]) 05/13/2020   Vitamin D insufficiency 05/13/2020   Class 1 obesity with serious comorbidity and body mass index (BMI) of 33.0 to 33.9 in adult    Essential hypertension 08/03/2017   Osteoarthritis of right knee 05/20/2015   Fibroid, uterine  Fever 09/30/2010   Supraventricular tachycardia-probable concealed accessory pathway 08/31/2010   Stress 08/31/2010   ALLERGIC RHINITIS 11/20/2008   GERD 11/20/2008   PALPITATIONS 11/20/2008   CHEST PAIN 11/20/2008    Rennie Natter, PT, DPT  02/25/2021, 2:38 PM  [facility] [address]  [place], [place], [address] Phone: [phone]   Fax:  [phone]  Name: Kimberly Boyd MRN: [id number] Date of Birth: 02-Oct-1965

## 2021-02-25 NOTE — Patient Instructions (Signed)
Access Code: [email or web address] URL: [email or web address] Date: 02/25/2021 Prepared by: Glenetta Hew  Exercises Supine Bridge - 3 x daily - 7 x weekly - 2 sets - 10 reps Supine Quad Set - 3 x daily - 7 x weekly - 1 sets - 10 reps - 10 sec hold Supine Hip Abduction - 3 x daily - 7 x weekly - 2 sets - 10 reps Supine Heel Slide with Strap - 3 x daily - 7 x weekly - 2 sets - 10 reps Seated Ankle Pumps - 10 x daily - 7 x weekly - 1 sets - 10 reps Sit to Stand with Counter Support - 3 x daily - 7 x weekly - 2 sets - 10 reps Seated Knee Extension AROM - 3 x daily - 7 x weekly - 2 sets - 10 reps Seated Quad Set - 3 x daily - 7 x weekly - 1 sets - 10 reps - 10 sec hold

## 2021-02-26 ENCOUNTER — Other Ambulatory Visit (HOSPITAL_COMMUNITY): Payer: Self-pay

## 2021-03-01 ENCOUNTER — Other Ambulatory Visit: Payer: Self-pay

## 2021-03-01 ENCOUNTER — Encounter: Payer: Self-pay | Admitting: Physical Therapy

## 2021-03-01 ENCOUNTER — Ambulatory Visit: Payer: 59 | Admitting: Physical Therapy

## 2021-03-01 DIAGNOSIS — M25661 Stiffness of right knee, not elsewhere classified: Secondary | ICD-10-CM | POA: Diagnosis not present

## 2021-03-01 DIAGNOSIS — R262 Difficulty in walking, not elsewhere classified: Secondary | ICD-10-CM

## 2021-03-01 DIAGNOSIS — M6281 Muscle weakness (generalized): Secondary | ICD-10-CM | POA: Diagnosis not present

## 2021-03-01 DIAGNOSIS — R600 Localized edema: Secondary | ICD-10-CM | POA: Diagnosis not present

## 2021-03-01 DIAGNOSIS — M25561 Pain in right knee: Secondary | ICD-10-CM

## 2021-03-01 DIAGNOSIS — Z79899 Other long term (current) drug therapy: Secondary | ICD-10-CM | POA: Diagnosis not present

## 2021-03-01 NOTE — Therapy (Signed)
[facility] [address]  [place], [place], [address] Phone: [phone]   Fax:  [phone]  Physical Therapy Treatment  Patient Details  Name: Kimberly Boyd MRN: [id number] Date of Birth: [birth year] Referring Provider (PT): Melrose Nakayama MD   Encounter Date: 03/01/2021   PT End of Session - 03/01/21 1021     Visit Number 2    Number of Visits 18    Date for PT Re-Evaluation 04/08/21    Authorization Type [facility] VL [id number]    PT Start Time 1017    PT Stop Time 1106    PT Time Calculation (min) 49 min    Activity Tolerance Patient tolerated treatment well    Behavior During Therapy WFL for tasks assessed/performed             Past Medical History:  Diagnosis Date   Anemia    Anxiety    Arthritis    Arthritis of left foot    Arthritis of right knee    Back pain years.    chronic low back pain    Chronic back pain    Complication of anesthesia    tachycardia, difficulty waking up   GERD (gastroesophageal reflux disease)    Heart murmur    questionable   Hypertension    Obesity    Palpitations    Pre-diabetes    SVT (supraventricular tachycardia) (HCC)     Past Surgical History:  Procedure Laterality Date   BREAST BIOPSY Right    Benign approx age 25   cervical dysplasia laser     dermoid cyst removal     IR GENERIC HISTORICAL  06/04/2014   IR RADIOLOGIST EVAL & MGMT 06/04/2014 Sandi Mariscal, MD [facility] INTERV RAD   LAPAROSCOPIC CHOLECYSTECTOMY     LIPOMA RESECTION     and redone   ORIF ANKLE FRACTURE  08/09/2011   Procedure: OPEN REDUCTION INTERNAL FIXATION (ORIF) ANKLE FRACTURE;  Surgeon: Wylene Simmer, MD;  Location: [facility];  Service: Orthopedics;  Laterality: Right;  ORIF Right Ankle Lateral Malleolus   scar revision for the lipoma     TOTAL KNEE ARTHROPLASTY Right 02/23/2021   Procedure: RIGHT TOTAL KNEE ARTHROPLASTY;  Surgeon: Melrose Nakayama, MD;  Location: [facility];  Service: Orthopedics;   Laterality: Right;   WISDOM TOOTH EXTRACTION      There were no vitals filed for this visit.   Subjective Assessment - 03/01/21 1020     Subjective Pt. reports she is doing well overall, compliant with HEP, except can't do bridges on her bed.    Pertinent History PMH - pre-diabetes, obesity, palpitations (SVT)    Diagnostic tests X-ray    Patient Stated Goals walk completely normal, return to exercise    Currently in Pain? Yes    Pain Score 3     Pain Location Knee    Pain Orientation Right    Pain Descriptors / Indicators Aching                [facility] PT Assessment - 03/01/21 0001       AROM   Right Knee Flexion 100      PROM   Right Knee Flexion 100                           [facility] Adult PT Treatment/Exercise - 03/01/21 0001       Exercises   Exercises Knee/Hip  Knee/Hip Exercises: Aerobic   Recumbent Bike L1 x 6 min   starting partial revolutions then backwards     Knee/Hip Exercises: Seated   Sit to Sand 2 sets;10 reps;with UE support;Other (comment)   cues to keep feet even     Knee/Hip Exercises: Supine   Short Arc Quad Sets Strengthening;Right;2 sets;10 reps    Heel Slides Strengthening;Both;2 sets;10 reps    Heel Slides Limitations with heels on ball cues for HS activation, strap to assist    Bridges 10 reps    Bridges Limitations using L side more due to difficulty bending R knee      Knee/Hip Exercises: Prone   Hamstring Curl 10 reps    Hip Extension Strengthening;Right;10 reps    Contract/Relax to Increase Flexion 5 x 6 sec      Modalities   Modalities Vasopneumatic      Vasopneumatic   Number Minutes Vasopneumatic  10 minutes    Vasopnuematic Location  Knee    Vasopneumatic Pressure Low    Vasopneumatic Temperature  34      Manual Therapy   Manual Therapy Joint mobilization;Soft tissue mobilization;Passive ROM    Manual therapy comments to R knee to improve ROM    Joint Mobilization patellar mobs, PA mobs tibia on  femur    Soft tissue mobilization STM to hamstring attachments med and lat,    Passive ROM R knee flexion with hip hyperextended to stretch rectus femoris, with gentle rotation to relax glutes.  Gentle overpressure to R knee in supine after STM to hamstrings to improve extension.                       PT Short Term Goals - 03/01/21 1140       PT SHORT TERM GOAL #1   Title Patient to be independent with initial HEP.    Time 2    Period Weeks    Status On-going    Target Date 03/11/21               PT Long Term Goals - 03/01/21 1140       PT LONG TERM GOAL #1   Title Patient to be independent with advanced HEP.    Time 6    Period Weeks    Status On-going    Target Date 04/08/21      PT LONG TERM GOAL #2   Title Pt. will demonstrate 0-120 deg R knee ROM for safety with gait and stairs.    Baseline 15-78    Time 6    Period Weeks    Status On-going   12/19- 100 deg R knee flexion   Target Date 04/08/21      PT LONG TERM GOAL #3   Title Pt. will demonstrate 5/5 RLE strength for safety with gait.    Baseline significant R quad lag    Time 6    Period Weeks    Status On-going    Target Date 04/08/21      PT LONG TERM GOAL #4   Title Patient will be able to ambulate 900' without AD and with good safety for community ambulation.    Baseline slow antalgic gait with 2WRW    Time 6    Period Weeks    Status On-going    Target Date 04/08/21      PT LONG TERM GOAL #5   Title Pt. will be able to ascend/descend 1 flight of stairs with  1 HR and good safety using reciprocal step to access home.    Baseline unable    Time 6    Period Weeks    Status On-going    Target Date 04/08/21                   Plan - 03/01/21 1137     Clinical Impression Statement Kimberly Boyd is making good progress and compliant with HEP.  She demonstrated significant improvement in R knee ROM, able to attain 100 deg AROM with bike today.  She still demonstrates quad lag and  difficulty activating R quads.  She tolerated review of HEP and progression of exercise well, although uncomfortable.  Reported decreased tightness and pain after manual therapy, finished session with game ready for post-session pain and swelling.    Personal Factors and Comorbidities Comorbidity 2    Comorbidities HTN, history SVT, pre-diabetes    Examination-Activity Limitations Bed Mobility;Bend;Carry;Stand;Stairs;Squat;Sit;Transfers;Locomotion Level    Examination-Participation Restrictions Occupation;Cleaning;Laundry;Yard Work;Community Activity;Driving;Meal Prep;Shop    Stability/Clinical Decision Making Stable/Uncomplicated    Rehab Potential Excellent    PT Frequency Other (comment)   2-3x/week   PT Duration 6 weeks    PT Treatment/Interventions ADLs/Self Care Home Management;Cryotherapy;Electrical Stimulation;Gait training;Stair training;Functional mobility training;Therapeutic activities;Therapeutic exercise;Balance training;Neuromuscular re-education;Patient/family education;Manual techniques;Scar mobilization;Passive range of motion;Dry needling;Taping;Vasopneumatic Device;Joint Manipulations    PT Next Visit Plan progress strengthening and ROM, modalities PRN    PT Home Exercise Plan Access Code: [email or web address]    Consulted and Agree with Plan of Care Patient             Patient will benefit from skilled therapeutic intervention in order to improve the following deficits and impairments:  Abnormal gait, Decreased endurance, Decreased skin integrity, Difficulty walking, Increased muscle spasms, Decreased range of motion, Decreased scar mobility, Increased edema, Decreased activity tolerance, Decreased strength, Increased fascial restricitons, Impaired flexibility, Pain, Decreased balance, Decreased mobility  Visit Diagnosis: Acute pain of right knee  Stiffness of right knee, not elsewhere classified  Localized edema  Muscle weakness (generalized)  Difficulty in walking, not  elsewhere classified     Problem List Patient Active Problem List   Diagnosis Date Noted   Primary osteoarthritis of right knee 02/23/2021   Other hyperlipidemia 12/22/2020   Prediabetes 05/13/2020   Arthritis of right knee 05/13/2020   Asymptomatic varicose veins of bilateral lower extremities 05/13/2020   Dysplastic nevus 05/13/2020   Elevated erythrocyte sedimentation rate 05/13/2020   Gout 05/13/2020   Gouty arthropathy 05/13/2020   Hot flashes 05/13/2020   Hyperglycemia 05/13/2020   Insomnia 05/13/2020   Menopause 05/13/2020   Iron deficiency anemia due to chronic blood loss 05/13/2020   Pica 05/13/2020   Pure hypercholesterolemia 05/13/2020   Right knee pain 05/13/2020   Sacroiliitis, not elsewhere classified ([place]) 05/13/2020   Vitamin D insufficiency 05/13/2020   Class 1 obesity with serious comorbidity and body mass index (BMI) of 33.0 to 33.9 in adult    Essential hypertension 08/03/2017   Osteoarthritis of right knee 05/20/2015   Fibroid, uterine    Fever 09/30/2010   Supraventricular tachycardia-probable concealed accessory pathway 08/31/2010   Stress 08/31/2010   ALLERGIC RHINITIS 11/20/2008   GERD 11/20/2008   PALPITATIONS 11/20/2008   CHEST PAIN 11/20/2008    Rennie Natter, PT, DPT  03/01/2021, 11:52 AM  [facility] [address]  [place], [place], [address] Phone: [phone]   Fax:  [phone]  Name: Kimberly Boyd MRN: [id number]  Date of Birth: 07/18/1965

## 2021-03-03 ENCOUNTER — Ambulatory Visit: Payer: 59 | Admitting: Physical Therapy

## 2021-03-03 ENCOUNTER — Other Ambulatory Visit: Payer: Self-pay

## 2021-03-03 DIAGNOSIS — M6281 Muscle weakness (generalized): Secondary | ICD-10-CM | POA: Diagnosis not present

## 2021-03-03 DIAGNOSIS — M25661 Stiffness of right knee, not elsewhere classified: Secondary | ICD-10-CM

## 2021-03-03 DIAGNOSIS — Z79899 Other long term (current) drug therapy: Secondary | ICD-10-CM | POA: Diagnosis not present

## 2021-03-03 DIAGNOSIS — R600 Localized edema: Secondary | ICD-10-CM | POA: Diagnosis not present

## 2021-03-03 DIAGNOSIS — M25561 Pain in right knee: Secondary | ICD-10-CM | POA: Diagnosis not present

## 2021-03-03 DIAGNOSIS — R262 Difficulty in walking, not elsewhere classified: Secondary | ICD-10-CM

## 2021-03-03 NOTE — Therapy (Addendum)
[facility] [address]  [place], [place], [address] Phone: [phone]   Fax:  [phone]  Physical Therapy Treatment  Patient Details  Name: Kimberly Boyd MRN: [id number] Date of Birth: 1965-09-12 Referring Provider (PT): Melrose Nakayama MD   Encounter Date: 03/03/2021   PT End of Session - 03/03/21 1350     Visit Number 3    Number of Visits 18    Date for PT Re-Evaluation 04/08/21    Authorization Type [facility] VL [id number]    PT Start Time 7209    PT Stop Time 1440    PT Time Calculation (min) 52 min    Activity Tolerance Patient tolerated treatment well;Patient limited by pain    Behavior During Therapy [facility] for tasks assessed/performed             Past Medical History:  Diagnosis Date   Anemia    Anxiety    Arthritis    Arthritis of left foot    Arthritis of right knee    Back pain years.    chronic low back pain    Chronic back pain    Complication of anesthesia    tachycardia, difficulty waking up   GERD (gastroesophageal reflux disease)    Heart murmur    questionable   Hypertension    Obesity    Palpitations    Pre-diabetes    SVT (supraventricular tachycardia) (HCC)     Past Surgical History:  Procedure Laterality Date   BREAST BIOPSY Right    Benign approx age 10   cervical dysplasia laser     dermoid cyst removal     IR GENERIC HISTORICAL  06/04/2014   IR RADIOLOGIST EVAL & MGMT 06/04/2014 Sandi Mariscal, MD [facility] INTERV RAD   LAPAROSCOPIC CHOLECYSTECTOMY     LIPOMA RESECTION     and redone   ORIF ANKLE FRACTURE  08/09/2011   Procedure: OPEN REDUCTION INTERNAL FIXATION (ORIF) ANKLE FRACTURE;  Surgeon: Wylene Simmer, MD;  Location: [place];  Service: Orthopedics;  Laterality: Right;  ORIF Right Ankle Lateral Malleolus   scar revision for the lipoma     TOTAL KNEE ARTHROPLASTY Right 02/23/2021   Procedure: RIGHT TOTAL KNEE ARTHROPLASTY;  Surgeon: Melrose Nakayama, MD;  Location: [facility];   Service: Orthopedics;  Laterality: Right;   WISDOM TOOTH EXTRACTION      There were no vitals filed for this visit.   Subjective Assessment - 03/03/21 1350     Subjective I was stiff the next day after my last visit. It's better now.    Pertinent History PMH - pre-diabetes, obesity, palpitations (SVT)    Patient Stated Goals walk completely normal, return to exercise    Currently in Pain? Yes    Pain Score 4     Pain Location Knee    Pain Orientation Right    Pain Descriptors / Indicators Aching    Pain Type Surgical pain                               [facility] Adult PT Treatment/Exercise - 03/03/21 0001       Ambulation/Gait   Ambulation/Gait Yes    Ambulation/Gait Assistance 6: Modified independent (Device/Increase time)    Ambulation Distance (Feet) 100 Feet    Assistive device Rolling walker;Straight cane    Gait Pattern Step-through pattern;Decreased stance time - right;Decreased stride length;Decreased hip/knee flexion - right  Ambulation Surface Level;Indoor    Gait Comments good gait pattern with RW; increased deviations with SPC but limited by ROM deficits      Knee/Hip Exercises: Stretches   Active Hamstring Stretch Right;2 reps;30 seconds    Active Hamstring Stretch Limitations with strap      Knee/Hip Exercises: Aerobic   Recumbent Bike L1 x 6 min   starting partial revolutions then backwards     Knee/Hip Exercises: Seated   Long Arc Quad Right;10 reps    Long Arc Quad Limitations 5 sec hold    Other Seated Knee/Hip Exercises seated knee flexion with left foot overpressure 10 sec hold x 6    Sit to [employer] 1 set;10 reps;without UE support   from mat with 2 airex cushions     Knee/Hip Exercises: Supine   Short Arc Quad Sets Strengthening;Right;2 sets;10 reps    Short Arc Quad Sets Limitations 5 sec  hold    Heel Slides Strengthening;Both;2 sets;10 reps    Heel Slides Limitations with heels on ball cues for HS activation, strap to assist       Modalities   Modalities Vasopneumatic      Vasopneumatic   Number Minutes Vasopneumatic  10 minutes    Vasopnuematic Location  Knee    Vasopneumatic Pressure Low    Vasopneumatic Temperature  34      Manual Therapy   Manual Therapy Joint mobilization;Soft tissue mobilization    Manual therapy comments decreased tolerance to PROM today after TE    Joint Mobilization patellar mobs,    Soft tissue mobilization STM to hamstring attachments med and lat,                       PT Short Term Goals - 03/01/21 1140       PT SHORT TERM GOAL #1   Title Patient to be independent with initial HEP.    Time 2    Period Weeks    Status On-going    Target Date 03/11/21               PT Long Term Goals - 03/01/21 1140       PT LONG TERM GOAL #1   Title Patient to be independent with advanced HEP.    Time 6    Period Weeks    Status On-going    Target Date 04/08/21      PT LONG TERM GOAL #2   Title Pt. will demonstrate 0-120 deg R knee ROM for safety with gait and stairs.    Baseline 15-78    Time 6    Period Weeks    Status On-going   12/19- 100 deg R knee flexion   Target Date 04/08/21      PT LONG TERM GOAL #3   Title Pt. will demonstrate 5/5 RLE strength for safety with gait.    Baseline significant R quad lag    Time 6    Period Weeks    Status On-going    Target Date 04/08/21      PT LONG TERM GOAL #4   Title Patient will be able to ambulate 900' without AD and with good safety for community ambulation.    Baseline slow antalgic gait with 2WRW    Time 6    Period Weeks    Status On-going    Target Date 04/08/21      PT LONG TERM GOAL #5   Title Pt. will be able  to ascend/descend 1 flight of stairs with 1 HR and good safety using reciprocal step to access home.    Baseline unable    Time 6    Period Weeks    Status On-going    Target Date 04/08/21                   Plan - 03/03/21 1438     Clinical Impression Statement Patient  progressing well with gait in RW with step through pattern and minor gait deviations. SPC was more difficult but pt able to correct with VCs. She is still more comfortable with RW at this time. Limited with TE somewhat by pain, but did well and is compliant with HEP. Continued with STM to post knee and vaso with good response. Patient reported some discomfort in right hip flexor. PT demonstrated possible stretches while pt on vaso, but this should be assessed next visit for tightness.    PT Treatment/Interventions ADLs/Self Care Home Management;Cryotherapy;Electrical Stimulation;Gait training;Stair training;Functional mobility training;Therapeutic activities;Therapeutic exercise;Balance training;Neuromuscular re-education;Patient/family education;Manual techniques;Scar mobilization;Passive range of motion;Dry needling;Taping;Vasopneumatic Device;Joint Manipulations    PT Next Visit Plan assess right hip flexor tightness; progress strengthening and ROM, modalities PRN             Patient will benefit from skilled therapeutic intervention in order to improve the following deficits and impairments:  Abnormal gait, Decreased endurance, Decreased skin integrity, Difficulty walking, Increased muscle spasms, Decreased range of motion, Decreased scar mobility, Increased edema, Decreased activity tolerance, Decreased strength, Increased fascial restricitons, Impaired flexibility, Pain, Decreased balance, Decreased mobility  Visit Diagnosis: Acute pain of right knee  Stiffness of right knee, not elsewhere classified  Localized edema  Muscle weakness (generalized)  Difficulty in walking, not elsewhere classified     Problem List Patient Active Problem List   Diagnosis Date Noted   Primary osteoarthritis of right knee 02/23/2021   Other hyperlipidemia 12/22/2020   Prediabetes 05/13/2020   Arthritis of right knee 05/13/2020   Asymptomatic varicose veins of bilateral lower extremities 05/13/2020    Dysplastic nevus 05/13/2020   Elevated erythrocyte sedimentation rate 05/13/2020   Gout 05/13/2020   Gouty arthropathy 05/13/2020   Hot flashes 05/13/2020   Hyperglycemia 05/13/2020   Insomnia 05/13/2020   Menopause 05/13/2020   Iron deficiency anemia due to chronic blood loss 05/13/2020   Pica 05/13/2020   Pure hypercholesterolemia 05/13/2020   Right knee pain 05/13/2020   Sacroiliitis, not elsewhere classified ([place]) 05/13/2020   Vitamin D insufficiency 05/13/2020   Class 1 obesity with serious comorbidity and body mass index (BMI) of 33.0 to 33.9 in adult    Essential hypertension 08/03/2017   Osteoarthritis of right knee 05/20/2015   Fibroid, uterine    Fever 09/30/2010   Supraventricular tachycardia-probable concealed accessory pathway 08/31/2010   Stress 08/31/2010   ALLERGIC RHINITIS 11/20/2008   GERD 11/20/2008   PALPITATIONS 11/20/2008   CHEST PAIN 11/20/2008    Madelyn Flavors, PT 03/03/2021, 3:03 PM  [facility] [address]  [place], [place], [address] Phone: [phone]   Fax:  [phone]  Name: Kimberly Boyd MRN: [id number] Date of Birth: Nov 20, 1965

## 2021-03-05 ENCOUNTER — Other Ambulatory Visit (HOSPITAL_COMMUNITY): Payer: Self-pay

## 2021-03-05 DIAGNOSIS — Z9889 Other specified postprocedural states: Secondary | ICD-10-CM | POA: Diagnosis not present

## 2021-03-05 DIAGNOSIS — M1711 Unilateral primary osteoarthritis, right knee: Secondary | ICD-10-CM | POA: Diagnosis not present

## 2021-03-05 MED ORDER — OXYCODONE-ACETAMINOPHEN 5-325 MG PO TABS
ORAL_TABLET | ORAL | 0 refills | Status: DC
  Filled 2021-03-05: qty 40, 5d supply, fill #0

## 2021-03-05 MED ORDER — TIZANIDINE HCL 4 MG PO TABS
ORAL_TABLET | ORAL | 0 refills | Status: DC
  Filled 2021-03-05: qty 40, 10d supply, fill #0

## 2021-03-09 ENCOUNTER — Ambulatory Visit: Payer: 59

## 2021-03-09 ENCOUNTER — Other Ambulatory Visit: Payer: Self-pay

## 2021-03-09 DIAGNOSIS — R600 Localized edema: Secondary | ICD-10-CM | POA: Diagnosis not present

## 2021-03-09 DIAGNOSIS — R262 Difficulty in walking, not elsewhere classified: Secondary | ICD-10-CM

## 2021-03-09 DIAGNOSIS — M25561 Pain in right knee: Secondary | ICD-10-CM

## 2021-03-09 DIAGNOSIS — M25661 Stiffness of right knee, not elsewhere classified: Secondary | ICD-10-CM

## 2021-03-09 DIAGNOSIS — Z79899 Other long term (current) drug therapy: Secondary | ICD-10-CM | POA: Diagnosis not present

## 2021-03-09 DIAGNOSIS — M6281 Muscle weakness (generalized): Secondary | ICD-10-CM | POA: Diagnosis not present

## 2021-03-09 NOTE — Therapy (Signed)
[facility] [address]  [place], [place], [address] Phone: [phone]   Fax:  [phone]  Physical Therapy Treatment  Patient Details  Name: Kimberly Boyd MRN: [id number] Date of Birth: June 14, 1965 Referring Provider (PT): Melrose Nakayama MD   Encounter Date: 03/09/2021   PT End of Session - 03/09/21 1156     Visit Number 4    Number of Visits 18    Date for PT Re-Evaluation 04/08/21    Authorization Type [facility] VL 25    PT Start Time 1101    PT Stop Time 1200    PT Time Calculation (min) 59 min    Activity Tolerance Patient tolerated treatment well    Behavior During Therapy WFL for tasks assessed/performed             Past Medical History:  Diagnosis Date   Anemia    Anxiety    Arthritis    Arthritis of left foot    Arthritis of right knee    Back pain years.    chronic low back pain    Chronic back pain    Complication of anesthesia    tachycardia, difficulty waking up   GERD (gastroesophageal reflux disease)    Heart murmur    questionable   Hypertension    Obesity    Palpitations    Pre-diabetes    SVT (supraventricular tachycardia) (HCC)     Past Surgical History:  Procedure Laterality Date   BREAST BIOPSY Right    Benign approx age 32   cervical dysplasia laser     dermoid cyst removal     IR GENERIC HISTORICAL  06/04/2014   IR RADIOLOGIST EVAL & MGMT 06/04/2014 Sandi Mariscal, MD [facility] INTERV RAD   LAPAROSCOPIC CHOLECYSTECTOMY     LIPOMA RESECTION     and redone   ORIF ANKLE FRACTURE  08/09/2011   Procedure: OPEN REDUCTION INTERNAL FIXATION (ORIF) ANKLE FRACTURE;  Surgeon: Wylene Simmer, MD;  Location: [facility];  Service: Orthopedics;  Laterality: Right;  ORIF Right Ankle Lateral Malleolus   scar revision for the lipoma     TOTAL KNEE ARTHROPLASTY Right 02/23/2021   Procedure: RIGHT TOTAL KNEE ARTHROPLASTY;  Surgeon: Melrose Nakayama, MD;  [facility];  Service: Orthopedics;   Laterality: Right;   WISDOM TOOTH EXTRACTION      There were no vitals filed for this visit.   Subjective Assessment - 03/09/21 1104     Subjective Pt feels like she is doing better, she can stand w/o holding onto walker at home and can go to the bathroom w/o walker.    Pertinent History PMH - pre-diabetes, obesity, palpitations (SVT)    Diagnostic tests X-ray    Patient Stated Goals walk completely normal, return to exercise    Currently in Pain? Yes    Pain Score 4     Pain Location Knee    Pain Orientation Right    Pain Descriptors / Indicators Aching    Pain Type Surgical pain                               [facility] Adult PT Treatment/Exercise - 03/09/21 0001       Ambulation/Gait   Ambulation/Gait Yes    Ambulation/Gait Assistance 6: Modified independent (Device/Increase time)    Ambulation Distance (Feet) 180 Feet    Assistive device Rolling walker    Gait Pattern  Step-through pattern;Decreased stance time - right;Decreased stride length;Decreased hip/knee flexion - right    Gait Comments increased UE use when advancing R leg fwd      Knee/Hip Exercises: Stretches   Active Hamstring Stretch Right;30 seconds    Active Hamstring Stretch Limitations seated      Knee/Hip Exercises: Aerobic   Recumbent Bike L1x56min, partial rev for 3 min      Knee/Hip Exercises: Seated   Long Arc Quad Right;10 reps    Long Arc Quad Limitations with alt PF/DF    Sit to [employer] 10 reps;without UE support   with staggered stance     Knee/Hip Exercises: Supine   Quad Sets Strengthening;Right;10 reps    Quad Sets Limitations 5 sec    Heel Slides AROM;Right;2 sets;10 reps;AAROM    Heel Slides Limitations 10 reps as AROM, 15 reps with strap and heels on peanut    Straight Leg Raises Strengthening;Right;2 sets;5 reps      Modalities   Modalities Vasopneumatic      Vasopneumatic   Number Minutes Vasopneumatic  10 minutes    Vasopnuematic Location  Knee    Vasopneumatic  Pressure Low    Vasopneumatic Temperature  34      Manual Therapy   Manual Therapy Soft tissue mobilization;Myofascial release    Joint Mobilization instruction on patellar mobs with husband    Soft tissue mobilization STM to hamstring attachments med and lat,    Myofascial Release MFR to R distal HS                       PT Short Term Goals - 03/01/21 1140       PT SHORT TERM GOAL #1   Title Patient to be independent with initial HEP.    Time 2    Period Weeks    Status On-going    Target Date 03/11/21               PT Long Term Goals - 03/01/21 1140       PT LONG TERM GOAL #1   Title Patient to be independent with advanced HEP.    Time 6    Period Weeks    Status On-going    Target Date 04/08/21      PT LONG TERM GOAL #2   Title Pt. will demonstrate 0-120 deg R knee ROM for safety with gait and stairs.    Baseline 15-78    Time 6    Period Weeks    Status On-going   12/19- 100 deg R knee flexion   Target Date 04/08/21      PT LONG TERM GOAL #3   Title Pt. will demonstrate 5/5 RLE strength for safety with gait.    Baseline significant R quad lag    Time 6    Period Weeks    Status On-going    Target Date 04/08/21      PT LONG TERM GOAL #4   Title Patient will be able to ambulate 900' without AD and with good safety for community ambulation.    Baseline slow antalgic gait with 2WRW    Time 6    Period Weeks    Status On-going    Target Date 04/08/21      PT LONG TERM GOAL #5   Title Pt. will be able to ascend/descend 1 flight of stairs with 1 HR and good safety using reciprocal step to access home.    Baseline  unable    Time 6    Period Weeks    Status On-going    Target Date 04/08/21                   Plan - 03/09/21 1157     Clinical Impression Statement SLR were very challenging for pt. She showed a considerable amount of UE use when advancing her R leg fwd. She tolerated the exercises well with no increased pain. She  has most pain when sitting or in supine with her R leg supported in extension. Reviewed patellar mobs with her and husband to keep mobility of the kneecap. Ended session with GR to address soreness and swelling post session.    Personal Factors and Comorbidities Comorbidity 2    Comorbidities HTN, history SVT, pre-diabetes    PT Frequency --   2-3x per week   PT Duration 6 weeks    PT Treatment/Interventions ADLs/Self Care Home Management;Cryotherapy;Electrical Stimulation;Gait training;Stair training;Functional mobility training;Therapeutic activities;Therapeutic exercise;Balance training;Neuromuscular re-education;Patient/family education;Manual techniques;Scar mobilization;Passive range of motion;Dry needling;Taping;Vasopneumatic Device;Joint Manipulations    PT Next Visit Plan assess right hip flexor tightness; progress strengthening and ROM, modalities PRN    PT Home Exercise Plan Access Code: [place] and Agree with Plan of Care Patient             Patient will benefit from skilled therapeutic intervention in order to improve the following deficits and impairments:  Abnormal gait, Decreased endurance, Decreased skin integrity, Difficulty walking, Increased muscle spasms, Decreased range of motion, Decreased scar mobility, Increased edema, Decreased activity tolerance, Decreased strength, Increased fascial restricitons, Impaired flexibility, Pain, Decreased balance, Decreased mobility  Visit Diagnosis: Acute pain of right knee  Stiffness of right knee, not elsewhere classified  Localized edema  Muscle weakness (generalized)  Difficulty in walking, not elsewhere classified     Problem List Patient Active Problem List   Diagnosis Date Noted   Primary osteoarthritis of right knee 02/23/2021   Other hyperlipidemia 12/22/2020   Prediabetes 05/13/2020   Arthritis of right knee 05/13/2020   Asymptomatic varicose veins of bilateral lower extremities 05/13/2020    Dysplastic nevus 05/13/2020   Elevated erythrocyte sedimentation rate 05/13/2020   Gout 05/13/2020   Gouty arthropathy 05/13/2020   Hot flashes 05/13/2020   Hyperglycemia 05/13/2020   Insomnia 05/13/2020   Menopause 05/13/2020   Iron deficiency anemia due to chronic blood loss 05/13/2020   Pica 05/13/2020   Pure hypercholesterolemia 05/13/2020   Right knee pain 05/13/2020   Sacroiliitis, not elsewhere classified ([place]) 05/13/2020   Vitamin D insufficiency 05/13/2020   Class 1 obesity with serious comorbidity and body mass index (BMI) of 33.0 to 33.9 in adult    Essential hypertension 08/03/2017   Osteoarthritis of right knee 05/20/2015   Fibroid, uterine    Fever 09/30/2010   Supraventricular tachycardia-probable concealed accessory pathway 08/31/2010   Stress 08/31/2010   ALLERGIC RHINITIS 11/20/2008   GERD 11/20/2008   PALPITATIONS 11/20/2008   CHEST PAIN 11/20/2008    Artist Pais, PTA 03/09/2021, 12:07 PM  [facility] [address]  [place], [place], [address] Phone: [phone]   Fax:  [phone]  Name: Kimberly Boyd MRN: [id number] Date of Birth: 1966-01-28

## 2021-03-10 ENCOUNTER — Ambulatory Visit: Payer: 59

## 2021-03-10 DIAGNOSIS — M25661 Stiffness of right knee, not elsewhere classified: Secondary | ICD-10-CM

## 2021-03-10 DIAGNOSIS — M25561 Pain in right knee: Secondary | ICD-10-CM

## 2021-03-10 DIAGNOSIS — Z79899 Other long term (current) drug therapy: Secondary | ICD-10-CM | POA: Diagnosis not present

## 2021-03-10 DIAGNOSIS — R262 Difficulty in walking, not elsewhere classified: Secondary | ICD-10-CM

## 2021-03-10 DIAGNOSIS — M6281 Muscle weakness (generalized): Secondary | ICD-10-CM | POA: Diagnosis not present

## 2021-03-10 DIAGNOSIS — R600 Localized edema: Secondary | ICD-10-CM

## 2021-03-10 NOTE — Therapy (Signed)
[facility] [address]  [place], [place], [address] Phone: [phone]   Fax:  [phone]  Physical Therapy Treatment  Patient Details  Name: Kimberly Boyd MRN: [id number] Date of Birth: 13-Feb-1966 Referring Provider (PT): Melrose Nakayama MD   Encounter Date: 03/10/2021   PT End of Session - 03/10/21 1150     Visit Number 5    Number of Visits 18    Date for PT Re-Evaluation 04/08/21    Authorization Type [facility] VL 25    PT Start Time 1017    PT Stop Time 1111    PT Time Calculation (min) 54 min    Activity Tolerance Patient tolerated treatment well    Behavior During Therapy WFL for tasks assessed/performed             Past Medical History:  Diagnosis Date   Anemia    Anxiety    Arthritis    Arthritis of left foot    Arthritis of right knee    Back pain years.    chronic low back pain    Chronic back pain    Complication of anesthesia    tachycardia, difficulty waking up   GERD (gastroesophageal reflux disease)    Heart murmur    questionable   Hypertension    Obesity    Palpitations    Pre-diabetes    SVT (supraventricular tachycardia) (HCC)     Past Surgical History:  Procedure Laterality Date   BREAST BIOPSY Right    Benign approx age 50   cervical dysplasia laser     dermoid cyst removal     IR GENERIC HISTORICAL  06/04/2014   IR RADIOLOGIST EVAL & MGMT 06/04/2014 Sandi Mariscal, MD [facility] INTERV RAD   LAPAROSCOPIC CHOLECYSTECTOMY     LIPOMA RESECTION     and redone   ORIF ANKLE FRACTURE  08/09/2011   Procedure: OPEN REDUCTION INTERNAL FIXATION (ORIF) ANKLE FRACTURE;  Surgeon: Wylene Simmer, MD;  Location: [facility];  Service: Orthopedics;  Laterality: Right;  ORIF Right Ankle Lateral Malleolus   scar revision for the lipoma     TOTAL KNEE ARTHROPLASTY Right 02/23/2021   Procedure: RIGHT TOTAL KNEE ARTHROPLASTY;  Surgeon: Melrose Nakayama, MD;  Location: [facility];  Service: Orthopedics;   Laterality: Right;   WISDOM TOOTH EXTRACTION      There were no vitals filed for this visit.   Subjective Assessment - 03/10/21 1021     Subjective "Was sore after the session yesterday."    Pertinent History PMH - pre-diabetes, obesity, palpitations (SVT)    Diagnostic tests X-ray    Patient Stated Goals walk completely normal, return to exercise    Currently in Pain? Yes    Pain Score 2     Pain Location Knee    Pain Orientation Right    Pain Descriptors / Indicators Aching    Pain Type Surgical pain                               [facility] Adult PT Treatment/Exercise - 03/10/21 0001       Ambulation/Gait   Ambulation/Gait Yes    Ambulation/Gait Assistance 6: Modified independent (Device/Increase time)    Ambulation Distance (Feet) 180 Feet    Assistive device Straight cane    Gait Pattern Step-through pattern    Gait Comments R stiff leg gait with decreased heel strike, mildly antalgic  Knee/Hip Exercises: Aerobic   Recumbent Bike L2x4min    Nustep L4x53min      Knee/Hip Exercises: Standing   Forward Step Up Right;10 reps;Step Height: 4"    Forward Step Up Limitations intermittent UE support    Functional Squat 10 reps    Functional Squat Limitations counter support    Other Standing Knee Exercises alt toe taps/marches with UE support 2x10    Other Standing Knee Exercises retro step with UE support at treadmill 15 reps      Knee/Hip Exercises: Seated   Heel Slides AROM;Right;10 reps    Heel Slides Limitations heel on peanut      Vasopneumatic   Number Minutes Vasopneumatic  10 minutes    Vasopnuematic Location  Knee    Vasopneumatic Pressure Low    Vasopneumatic Temperature  34      Manual Therapy   Manual Therapy Passive ROM    Passive ROM R knee flexion and ext with joint distraction                       PT Short Term Goals - 03/01/21 1140       PT SHORT TERM GOAL #1   Title Patient to be independent with initial  HEP.    Time 2    Period Weeks    Status On-going    Target Date 03/11/21               PT Long Term Goals - 03/01/21 1140       PT LONG TERM GOAL #1   Title Patient to be independent with advanced HEP.    Time 6    Period Weeks    Status On-going    Target Date 04/08/21      PT LONG TERM GOAL #2   Title Pt. will demonstrate 0-120 deg R knee ROM for safety with gait and stairs.    Baseline 15-78    Time 6    Period Weeks    Status On-going   12/19- 100 deg R knee flexion   Target Date 04/08/21      PT LONG TERM GOAL #3   Title Pt. will demonstrate 5/5 RLE strength for safety with gait.    Baseline significant R quad lag    Time 6    Period Weeks    Status On-going    Target Date 04/08/21      PT LONG TERM GOAL #4   Title Patient will be able to ambulate 900' without AD and with good safety for community ambulation.    Baseline slow antalgic gait with 2WRW    Time 6    Period Weeks    Status On-going    Target Date 04/08/21      PT LONG TERM GOAL #5   Title Pt. will be able to ascend/descend 1 flight of stairs with 1 HR and good safety using reciprocal step to access home.    Baseline unable    Time 6    Period Weeks    Status On-going    Target Date 04/08/21                   Plan - 03/10/21 1151     Clinical Impression Statement Pt was able to walk with cane more today showing a good stable gait pattern but needing cues to increase heel strike. Educated her on trying to use her cane while at home to facilitate more  of a normal gait pattern. She has most discomfort when having her R leg in supported extension. Progressed standing ther ex just fine today with no complaints from patient. Ended session with GR to address swelling post session.    Personal Factors and Comorbidities Comorbidity 2    Comorbidities HTN, history SVT, pre-diabetes    PT Frequency --   2-3x per week   PT Duration 6 weeks    PT Treatment/Interventions ADLs/Self Care Home  Management;Cryotherapy;Electrical Stimulation;Gait training;Stair training;Functional mobility training;Therapeutic activities;Therapeutic exercise;Balance training;Neuromuscular re-education;Patient/family education;Manual techniques;Scar mobilization;Passive range of motion;Dry needling;Taping;Vasopneumatic Device;Joint Manipulations    PT Next Visit Plan assess right hip flexor tightness; progress strengthening and ROM, modalities PRN    PT Home Exercise Plan Access Code: [place] and Agree with Plan of Care Patient             Patient will benefit from skilled therapeutic intervention in order to improve the following deficits and impairments:  Abnormal gait, Decreased endurance, Decreased skin integrity, Difficulty walking, Increased muscle spasms, Decreased range of motion, Decreased scar mobility, Increased edema, Decreased activity tolerance, Decreased strength, Increased fascial restricitons, Impaired flexibility, Pain, Decreased balance, Decreased mobility  Visit Diagnosis: Acute pain of right knee  Stiffness of right knee, not elsewhere classified  Localized edema  Muscle weakness (generalized)  Difficulty in walking, not elsewhere classified     Problem List Patient Active Problem List   Diagnosis Date Noted   Primary osteoarthritis of right knee 02/23/2021   Other hyperlipidemia 12/22/2020   Prediabetes 05/13/2020   Arthritis of right knee 05/13/2020   Asymptomatic varicose veins of bilateral lower extremities 05/13/2020   Dysplastic nevus 05/13/2020   Elevated erythrocyte sedimentation rate 05/13/2020   Gout 05/13/2020   Gouty arthropathy 05/13/2020   Hot flashes 05/13/2020   Hyperglycemia 05/13/2020   Insomnia 05/13/2020   Menopause 05/13/2020   Iron deficiency anemia due to chronic blood loss 05/13/2020   Pica 05/13/2020   Pure hypercholesterolemia 05/13/2020   Right knee pain 05/13/2020   Sacroiliitis, not elsewhere classified ([place])  05/13/2020   Vitamin D insufficiency 05/13/2020   Class 1 obesity with serious comorbidity and body mass index (BMI) of 33.0 to 33.9 in adult    Essential hypertension 08/03/2017   Osteoarthritis of right knee 05/20/2015   Fibroid, uterine    Fever 09/30/2010   Supraventricular tachycardia-probable concealed accessory pathway 08/31/2010   Stress 08/31/2010   ALLERGIC RHINITIS 11/20/2008   GERD 11/20/2008   PALPITATIONS 11/20/2008   CHEST PAIN 11/20/2008    Artist Pais, PTA 03/10/2021, 11:53 AM  [facility] [address]  [place], [place], [address] Phone: [phone]   Fax:  [phone]  Name: Kimberly Boyd MRN: [id number] Date of Birth: 22-Jan-1966

## 2021-03-11 ENCOUNTER — Other Ambulatory Visit (HOSPITAL_COMMUNITY): Payer: Self-pay

## 2021-03-12 ENCOUNTER — Other Ambulatory Visit (HOSPITAL_COMMUNITY): Payer: Self-pay

## 2021-03-12 ENCOUNTER — Ambulatory Visit: Payer: 59

## 2021-03-12 ENCOUNTER — Other Ambulatory Visit: Payer: Self-pay

## 2021-03-12 ENCOUNTER — Encounter: Payer: 59 | Admitting: Physical Therapy

## 2021-03-12 DIAGNOSIS — M6281 Muscle weakness (generalized): Secondary | ICD-10-CM

## 2021-03-12 DIAGNOSIS — R262 Difficulty in walking, not elsewhere classified: Secondary | ICD-10-CM | POA: Diagnosis not present

## 2021-03-12 DIAGNOSIS — M25561 Pain in right knee: Secondary | ICD-10-CM | POA: Diagnosis not present

## 2021-03-12 DIAGNOSIS — Z79899 Other long term (current) drug therapy: Secondary | ICD-10-CM | POA: Diagnosis not present

## 2021-03-12 DIAGNOSIS — M25661 Stiffness of right knee, not elsewhere classified: Secondary | ICD-10-CM | POA: Diagnosis not present

## 2021-03-12 DIAGNOSIS — R600 Localized edema: Secondary | ICD-10-CM | POA: Diagnosis not present

## 2021-03-12 NOTE — Therapy (Signed)
[facility] [address]  [place], [place], [address] Phone: [phone]   Fax:  [phone]  Physical Therapy Treatment  Patient Details  Name: Kimberly Boyd MRN: [id number] Date of Birth: 07-13-1965 Referring Provider (PT): Melrose Nakayama MD   Encounter Date: 03/12/2021   PT End of Session - 03/12/21 0856     Visit Number 6    Number of Visits 18    Date for PT Re-Evaluation 04/08/21    Authorization Type [facility] VL 25    PT Start Time 0800    PT Stop Time 0858    PT Time Calculation (min) 58 min    Activity Tolerance Patient tolerated treatment well    Behavior During Therapy [facility] for tasks assessed/performed             Past Medical History:  Diagnosis Date   Anemia    Anxiety    Arthritis    Arthritis of left foot    Arthritis of right knee    Back pain years.    chronic low back pain    Chronic back pain    Complication of anesthesia    tachycardia, difficulty waking up   GERD (gastroesophageal reflux disease)    Heart murmur    questionable   Hypertension    Obesity    Palpitations    Pre-diabetes    SVT (supraventricular tachycardia) (HCC)     Past Surgical History:  Procedure Laterality Date   BREAST BIOPSY Right    Benign approx age 22   cervical dysplasia laser     dermoid cyst removal     IR GENERIC HISTORICAL  06/04/2014   IR RADIOLOGIST EVAL & MGMT 06/04/2014 Sandi Mariscal, MD [facility] INTERV RAD   LAPAROSCOPIC CHOLECYSTECTOMY     LIPOMA RESECTION     and redone   ORIF ANKLE FRACTURE  08/09/2011   Procedure: OPEN REDUCTION INTERNAL FIXATION (ORIF) ANKLE FRACTURE;  Surgeon: Wylene Simmer, MD;  Location: [facility];  Service: Orthopedics;  Laterality: Right;  ORIF Right Ankle Lateral Malleolus   scar revision for the lipoma     TOTAL KNEE ARTHROPLASTY Right 02/23/2021   Procedure: RIGHT TOTAL KNEE ARTHROPLASTY;  Surgeon: Melrose Nakayama, MD;  Location[facility];  Service: Orthopedics;   Laterality: Right;   WISDOM TOOTH EXTRACTION      There were no vitals filed for this visit.   Subjective Assessment - 03/12/21 0803     Subjective Pt reports that the last session went great, having some discomfort this morning.    Pertinent History PMH - pre-diabetes, obesity, palpitations (SVT)    Diagnostic tests X-ray    Patient Stated Goals walk completely normal, return to exercise    Currently in Pain? Yes    Pain Score 2     Pain Location Knee    Pain Orientation Right    Pain Descriptors / Indicators Discomfort    Pain Type Surgical pain                               [facility] Adult PT Treatment/Exercise - 03/12/21 0001       Ambulation/Gait   Ambulation/Gait Yes    Ambulation/Gait Assistance 6: Modified independent (Device/Increase time)    Ambulation Distance (Feet) 180 Feet    Assistive device Straight cane    Gait Pattern Step-through pattern;Decreased step length - left;Decreased stance time - right  Gait Comments decreased heel strike and tends to lean fwd when advancing R leg      Knee/Hip Exercises: Aerobic   Recumbent Bike L2x20min      Knee/Hip Exercises: Standing   Forward Step Up Right;10 reps;Step Height: 4"    Forward Step Up Limitations intermittent UE support    Functional Squat 10 reps    Functional Squat Limitations UE support    Other Standing Knee Exercises retro step with UE support at treadmill R arm added to increase quad activation 10 reps      Knee/Hip Exercises: Seated   Long Arc Quad Strengthening;Both;Weights    Long Arc Quad Weight 2 lbs.    Long [employer] Limitations 8 reps    Marching Strengthening;Both;10 reps;Weights    [employer] 2 lbs.      Vasopneumatic   Number Minutes Vasopneumatic  10 minutes    Vasopnuematic Location  Knee    Vasopneumatic Pressure Low    Vasopneumatic Temperature  34      Manual Therapy   Manual Therapy Passive ROM    Soft tissue mobilization STM to hamstring attachments  med and lat,    Passive ROM R knee flexion and ext with joint distraction                       PT Short Term Goals - 03/12/21 5188       PT SHORT TERM GOAL #1   Title Patient to be independent with initial HEP.    Time 2    Period Weeks    Status Achieved   03/12/21   Target Date 03/11/21               PT Long Term Goals - 03/01/21 1140       PT LONG TERM GOAL #1   Title Patient to be independent with advanced HEP.    Time 6    Period Weeks    Status On-going    Target Date 04/08/21      PT LONG TERM GOAL #2   Title Pt. will demonstrate 0-120 deg R knee ROM for safety with gait and stairs.    Baseline 15-78    Time 6    Period Weeks    Status On-going   12/19- 100 deg R knee flexion   Target Date 04/08/21      PT LONG TERM GOAL #3   Title Pt. will demonstrate 5/5 RLE strength for safety with gait.    Baseline significant R quad lag    Time 6    Period Weeks    Status On-going    Target Date 04/08/21      PT LONG TERM GOAL #4   Title Patient will be able to ambulate 900' without AD and with good safety for community ambulation.    Baseline slow antalgic gait with 2WRW    Time 6    Period Weeks    Status On-going    Target Date 04/08/21      PT LONG TERM GOAL #5   Title Pt. will be able to ascend/descend 1 flight of stairs with 1 HR and good safety using reciprocal step to access home.    Baseline unable    Time 6    Period Weeks    Status On-going    Target Date 04/08/21                   Plan - 03/12/21  0902     Clinical Impression Statement Pt well tolerated the treatment today, still limited with extension. Did STM to the distal HS muscles with some PROM to improve knee ROM. Instructed pt to sit with her knee in extension for a prolonged stretch. Cues for full knee extension with step ups and to avoid compensation with body momentum. Ended session with GR to address swelling.    Personal Factors and Comorbidities  Comorbidity 2    Comorbidities HTN, history SVT, pre-diabetes    PT Frequency --   2-3x per week   PT Duration 6 weeks    PT Treatment/Interventions ADLs/Self Care Home Management;Cryotherapy;Electrical Stimulation;Gait training;Stair training;Functional mobility training;Therapeutic activities;Therapeutic exercise;Balance training;Neuromuscular re-education;Patient/family education;Manual techniques;Scar mobilization;Passive range of motion;Dry needling;Taping;Vasopneumatic Device;Joint Manipulations    PT Next Visit Plan assess right hip flexor tightness; progress strengthening and ROM, modalities PRN    PT Home Exercise Plan Access Code: [place] and Agree with Plan of Care Patient             Patient will benefit from skilled therapeutic intervention in order to improve the following deficits and impairments:  Abnormal gait, Decreased endurance, Decreased skin integrity, Difficulty walking, Increased muscle spasms, Decreased range of motion, Decreased scar mobility, Increased edema, Decreased activity tolerance, Decreased strength, Increased fascial restricitons, Impaired flexibility, Pain, Decreased balance, Decreased mobility  Visit Diagnosis: Acute pain of right knee  Stiffness of right knee, not elsewhere classified  Localized edema  Muscle weakness (generalized)  Difficulty in walking, not elsewhere classified     Problem List Patient Active Problem List   Diagnosis Date Noted   Primary osteoarthritis of right knee 02/23/2021   Other hyperlipidemia 12/22/2020   Prediabetes 05/13/2020   Arthritis of right knee 05/13/2020   Asymptomatic varicose veins of bilateral lower extremities 05/13/2020   Dysplastic nevus 05/13/2020   Elevated erythrocyte sedimentation rate 05/13/2020   Gout 05/13/2020   Gouty arthropathy 05/13/2020   Hot flashes 05/13/2020   Hyperglycemia 05/13/2020   Insomnia 05/13/2020   Menopause 05/13/2020   Iron deficiency anemia due to  chronic blood loss 05/13/2020   Pica 05/13/2020   Pure hypercholesterolemia 05/13/2020   Right knee pain 05/13/2020   Sacroiliitis, not elsewhere classified ([place]) 05/13/2020   Vitamin D insufficiency 05/13/2020   Class 1 obesity with serious comorbidity and body mass index (BMI) of 33.0 to 33.9 in adult    Essential hypertension 08/03/2017   Osteoarthritis of right knee 05/20/2015   Fibroid, uterine    Fever 09/30/2010   Supraventricular tachycardia-probable concealed accessory pathway 08/31/2010   Stress 08/31/2010   ALLERGIC RHINITIS 11/20/2008   GERD 11/20/2008   PALPITATIONS 11/20/2008   CHEST PAIN 11/20/2008    Artist Pais, PTA 03/12/2021, 9:51 AM  [facility] [address]  [place], [place], [address] Phone: [phone]   Fax:  [phone]  Name: Kimberly Boyd MRN: [id number] Date of Birth: 08/10/1965

## 2021-03-16 ENCOUNTER — Other Ambulatory Visit: Payer: Self-pay

## 2021-03-16 ENCOUNTER — Ambulatory Visit: Payer: 59 | Attending: Orthopaedic Surgery | Admitting: Physical Therapy

## 2021-03-16 ENCOUNTER — Encounter: Payer: Self-pay | Admitting: Physical Therapy

## 2021-03-16 ENCOUNTER — Other Ambulatory Visit (HOSPITAL_COMMUNITY): Payer: Self-pay

## 2021-03-16 DIAGNOSIS — M6281 Muscle weakness (generalized): Secondary | ICD-10-CM | POA: Insufficient documentation

## 2021-03-16 DIAGNOSIS — R600 Localized edema: Secondary | ICD-10-CM | POA: Diagnosis not present

## 2021-03-16 DIAGNOSIS — R262 Difficulty in walking, not elsewhere classified: Secondary | ICD-10-CM | POA: Diagnosis not present

## 2021-03-16 DIAGNOSIS — M25661 Stiffness of right knee, not elsewhere classified: Secondary | ICD-10-CM | POA: Insufficient documentation

## 2021-03-16 DIAGNOSIS — M25561 Pain in right knee: Secondary | ICD-10-CM | POA: Diagnosis not present

## 2021-03-16 NOTE — Patient Instructions (Signed)

## 2021-03-16 NOTE — Therapy (Signed)
[facility] [address]  [place], [place], [address] Phone: [phone]   Fax:  [phone]  Physical Therapy Treatment  Patient Details  Name: Kimberly Boyd MRN: [id number] Date of Birth: 1965/04/21 Referring Provider (PT): Melrose Nakayama MD   Encounter Date: 03/16/2021   PT End of Session - 03/16/21 1023     Visit Number 7    Number of Visits 18    Date for PT Re-Evaluation 04/08/21    Authorization Type [facility] VL 25    PT Start Time 1019    PT Stop Time 1110    PT Time Calculation (min) 51 min    Activity Tolerance Patient tolerated treatment well    Behavior During Therapy WFL for tasks assessed/performed             Past Medical History:  Diagnosis Date   Anemia    Anxiety    Arthritis    Arthritis of left foot    Arthritis of right knee    Back pain years.    chronic low back pain    Chronic back pain    Complication of anesthesia    tachycardia, difficulty waking up   GERD (gastroesophageal reflux disease)    Heart murmur    questionable   Hypertension    Obesity    Palpitations    Pre-diabetes    SVT (supraventricular tachycardia) (HCC)     Past Surgical History:  Procedure Laterality Date   BREAST BIOPSY Right    Benign approx age 53   cervical dysplasia laser     dermoid cyst removal     IR GENERIC HISTORICAL  06/04/2014   IR RADIOLOGIST EVAL & MGMT 06/04/2014 Sandi Mariscal, MD [facility] INTERV RAD   LAPAROSCOPIC CHOLECYSTECTOMY     LIPOMA RESECTION     and redone   ORIF ANKLE FRACTURE  08/09/2011   Procedure: OPEN REDUCTION INTERNAL FIXATION (ORIF) ANKLE FRACTURE;  Surgeon: Wylene Simmer, MD;  Location: [facility];  Service: Orthopedics;  Laterality: Right;  ORIF Right Ankle Lateral Malleolus   scar revision for the lipoma     TOTAL KNEE ARTHROPLASTY Right 02/23/2021   Procedure: RIGHT TOTAL KNEE ARTHROPLASTY;  Surgeon: Melrose Nakayama, MD;  Location: [facility];  Service: Orthopedics;   Laterality: Right;   WISDOM TOOTH EXTRACTION      There were no vitals filed for this visit.   Subjective Assessment - 03/16/21 1021     Subjective Pt. reports she "graduated myself" to a cane, also having eaiser time with bike at home.  Would like to start doing some stairs.    Pertinent History PMH - pre-diabetes, obesity, palpitations (SVT)    Diagnostic tests X-ray    Patient Stated Goals walk completely normal, return to exercise    Currently in Pain? Yes    Pain Score 2     Pain Location Knee    Pain Orientation Right    Pain Descriptors / Indicators Discomfort                [facility] PT Assessment - 03/16/21 0001       Assessment   Medical Diagnosis Z96.659 (ICD-10-CM) - S/P total knee replacement    Referring Provider (PT) Melrose Nakayama MD    Onset Date/Surgical Date 02/23/21    Hand Dominance Left    Next MD Visit 03/17/2021      Precautions   Precautions None      Restrictions  Weight Bearing Restrictions No                           [facility] Adult PT Treatment/Exercise - 03/16/21 0001       Ambulation/Gait   Stairs Yes    Stairs Assistance 5: Supervision    Stair Management Technique One rail Right;Step to pattern    Number of Stairs 12    Height of Stairs 8    Gait Comments cues for stairs "up with the good, down with the bad"      Exercises   Exercises Knee/Hip      Knee/Hip Exercises: Stretches   Other Knee/Hip Stretches runners stretch while PT mobilizing R tibia into ER to improve extension.      Knee/Hip Exercises: Aerobic   Recumbent Bike L2x27mn      Knee/Hip Exercises: Standing   Lateral Step Up Both;2 sets;10 reps;Hand Hold: 2;Step Height: 8"    Lateral Step Up Limitations using HR with full size step    Forward Step Up 2 sets;10 reps;Step Height: 8";Both    Forward Step Up Limitations full size step using HR    Step Down Both;2 sets;10 reps;Hand Hold: 2;Step Height: 8"    Step Down Limitations dips for eccentric  strengthening, SBA for safety      Vasopneumatic   Number Minutes Vasopneumatic  10 minutes    Vasopnuematic Location  Knee    Vasopneumatic Pressure Low    Vasopneumatic Temperature  34      Manual Therapy   Manual Therapy Joint mobilization;Soft tissue mobilization;Other (comment)    Manual therapy comments to improve R knee extension    Joint Mobilization tibia glides into ER in standing    Soft tissue mobilization STM to hamstring attachments med and lat,    Other Manual Therapy skilled palpation and monitoring with dry needling              Trigger Point Dry Needling - 03/16/21 0001     Consent Given? Yes    Education Handout Provided Yes    Muscles Treated Lower Quadrant Hamstring    Dry Needling Comments right med and lateral, staying well away from knee joint    Hamstring Response Twitch response elicited;Palpable increased muscle length                   PT Education - 03/16/21 1213     Education Details education on stairs, education on dry needling including risks and benefits.    Person(s) Educated Patient    Methods Explanation;Demonstration;Handout    Comprehension Verbalized understanding;Returned demonstration              PT Short Term Goals - 03/12/21 0808       PT SHORT TERM GOAL #1   Title Patient to be independent with initial HEP.    Time 2    Period Weeks    Status Achieved   03/12/21   Target Date 03/11/21               PT Long Term Goals - 03/16/21 1203       PT LONG TERM GOAL #1   Title Patient to be independent with advanced HEP.    Time 6    Period Weeks    Status On-going   03/16/2021- met for current, very compliant.   Target Date 04/08/21      PT LONG TERM GOAL #2   Title Pt. will  demonstrate 0-120 deg R knee ROM for safety with gait and stairs.    Baseline 15-78    Time 6    Period Weeks    Status On-going   12/19- 100 deg R knee flexion   Target Date 04/08/21      PT LONG TERM GOAL #3   Title Pt.  will demonstrate 5/5 RLE strength for safety with gait.    Baseline significant R quad lag    Time 6    Period Weeks    Status On-going    Target Date 04/08/21      PT LONG TERM GOAL #4   Title Patient will be able to ambulate 900' without AD and with good safety for community ambulation.    Baseline slow antalgic gait with 2WRW    Time 6    Period Weeks    Status On-going   03/16/2020- using [facility]   Target Date 04/08/21      PT LONG TERM GOAL #5   Title Pt. will be able to ascend/descend 1 flight of stairs with 1 HR and good safety using reciprocal step to access home.    Baseline unable    Time 6    Period Weeks    Status On-going   03/16/20- step to gait up and down stairs with 1 HR   Target Date 04/08/21                   Plan - 03/16/21 1205     Clinical Impression Statement Patient is making good progress.  Continues to demonstrate improving R knee flexion, able to step up on stairs easily and progressing with bike.  Unfortunately, she thought her follow-up with MD was next week and formal ROM was not taken this session, but visually ~ 110 deg knee flexion, but still limited with R knee extension ~ 10 deg.   Today focused on functional strengthening with stairs, followed by stretching and dry needling to med/lat hamstrings to improve extension.  She reported decreased tightness following drying needling.  Game ready end of session for post session soreness/edema.  She would benefit from continued skilled therapy.    Personal Factors and Comorbidities Comorbidity 2    Comorbidities HTN, history SVT, pre-diabetes    PT Frequency --   2-3x per week   PT Duration 6 weeks    PT Treatment/Interventions ADLs/Self Care Home Management;Cryotherapy;Electrical Stimulation;Gait training;Stair training;Functional mobility training;Therapeutic activities;Therapeutic exercise;Balance training;Neuromuscular re-education;Patient/family education;Manual techniques;Scar mobilization;Passive  range of motion;Dry needling;Taping;Vasopneumatic Device;Joint Manipulations    PT Next Visit Plan assess right hip flexor tightness; progress strengthening and ROM, modalities PRN    PT Home Exercise Plan Access Code: [name] and Agree with Plan of Care Patient             Patient will benefit from skilled therapeutic intervention in order to improve the following deficits and impairments:  Abnormal gait, Decreased endurance, Decreased skin integrity, Difficulty walking, Increased muscle spasms, Decreased range of motion, Decreased scar mobility, Increased edema, Decreased activity tolerance, Decreased strength, Increased fascial restricitons, Impaired flexibility, Pain, Decreased balance, Decreased mobility  Visit Diagnosis: Acute pain of right knee  Stiffness of right knee, not elsewhere classified  Localized edema  Muscle weakness (generalized)  Difficulty in walking, not elsewhere classified     Problem List Patient Active Problem List   Diagnosis Date Noted   Primary osteoarthritis of right knee 02/23/2021   Other hyperlipidemia 12/22/2020   Prediabetes 05/13/2020  Arthritis of right knee 05/13/2020   Asymptomatic varicose veins of bilateral lower extremities 05/13/2020   Dysplastic nevus 05/13/2020   Elevated erythrocyte sedimentation rate 05/13/2020   Gout 05/13/2020   Gouty arthropathy 05/13/2020   Hot flashes 05/13/2020   Hyperglycemia 05/13/2020   Insomnia 05/13/2020   Menopause 05/13/2020   Iron deficiency anemia due to chronic blood loss 05/13/2020   Pica 05/13/2020   Pure hypercholesterolemia 05/13/2020   Right knee pain 05/13/2020   Sacroiliitis, not elsewhere classified ([place]) 05/13/2020   Vitamin D insufficiency 05/13/2020   Class 1 obesity with serious comorbidity and body mass index (BMI) of 33.0 to 33.9 in adult    Essential hypertension 08/03/2017   Osteoarthritis of right knee 05/20/2015   Fibroid, uterine    Fever 09/30/2010    Supraventricular tachycardia-probable concealed accessory pathway 08/31/2010   Stress 08/31/2010   ALLERGIC RHINITIS 11/20/2008   GERD 11/20/2008   PALPITATIONS 11/20/2008   CHEST PAIN 11/20/2008    Rennie Natter, PT, DPT  03/16/2021, 12:14 PM  [facility] [address]  [place], [place], [address] Phone: [phone]   Fax:  [phone]  Name: TEIGEN PARSLOW MRN: [id number] Date of Birth: 02-Oct-1965

## 2021-03-17 ENCOUNTER — Telehealth (INDEPENDENT_AMBULATORY_CARE_PROVIDER_SITE_OTHER): Payer: 59 | Admitting: Family Medicine

## 2021-03-17 ENCOUNTER — Other Ambulatory Visit (HOSPITAL_COMMUNITY): Payer: Self-pay

## 2021-03-17 DIAGNOSIS — E669 Obesity, unspecified: Secondary | ICD-10-CM | POA: Diagnosis not present

## 2021-03-17 DIAGNOSIS — Z6833 Body mass index (BMI) 33.0-33.9, adult: Secondary | ICD-10-CM | POA: Diagnosis not present

## 2021-03-17 DIAGNOSIS — Z96651 Presence of right artificial knee joint: Secondary | ICD-10-CM | POA: Diagnosis not present

## 2021-03-17 MED ORDER — TRIAMTERENE-HCTZ 37.5-25 MG PO TABS
ORAL_TABLET | ORAL | 1 refills | Status: DC
  Filled 2021-03-17: qty 90, 90d supply, fill #0
  Filled 2021-06-07: qty 90, 90d supply, fill #1

## 2021-03-17 MED ORDER — OXYCODONE-ACETAMINOPHEN 5-325 MG PO TABS
ORAL_TABLET | ORAL | 0 refills | Status: DC
  Filled 2021-03-17: qty 40, 5d supply, fill #0

## 2021-03-18 ENCOUNTER — Ambulatory Visit: Payer: 59 | Admitting: Physical Therapy

## 2021-03-18 ENCOUNTER — Other Ambulatory Visit: Payer: Self-pay

## 2021-03-18 ENCOUNTER — Encounter: Payer: Self-pay | Admitting: Physical Therapy

## 2021-03-18 ENCOUNTER — Encounter (INDEPENDENT_AMBULATORY_CARE_PROVIDER_SITE_OTHER): Payer: Self-pay | Admitting: Family Medicine

## 2021-03-18 DIAGNOSIS — R600 Localized edema: Secondary | ICD-10-CM

## 2021-03-18 DIAGNOSIS — M25561 Pain in right knee: Secondary | ICD-10-CM

## 2021-03-18 DIAGNOSIS — M25661 Stiffness of right knee, not elsewhere classified: Secondary | ICD-10-CM | POA: Diagnosis not present

## 2021-03-18 DIAGNOSIS — M6281 Muscle weakness (generalized): Secondary | ICD-10-CM | POA: Diagnosis not present

## 2021-03-18 DIAGNOSIS — R262 Difficulty in walking, not elsewhere classified: Secondary | ICD-10-CM

## 2021-03-18 NOTE — Therapy (Signed)
[facility] [address]  [place], [place], [address] Phone: [phone]   Fax:  [phone]  Physical Therapy Treatment  Patient Details  Name: Kimberly Boyd MRN: [id number] Date of Birth: 1965-09-22 Referring Provider (PT): Melrose Nakayama MD   Encounter Date: 03/18/2021   PT End of Session - 03/18/21 1543     Visit Number 8    Number of Visits 18    Date for PT Re-Evaluation 04/08/21    Authorization Type [facility] VL 25    PT Start Time 1536    PT Stop Time 1630    PT Time Calculation (min) 54 min    Activity Tolerance Patient tolerated treatment well    Behavior During Therapy [facility] for tasks assessed/performed             Past Medical History:  Diagnosis Date   Anemia    Anxiety    Arthritis    Arthritis of left foot    Arthritis of right knee    Back pain years.    chronic low back pain    Chronic back pain    Complication of anesthesia    tachycardia, difficulty waking up   GERD (gastroesophageal reflux disease)    Heart murmur    questionable   Hypertension    Obesity    Palpitations    Pre-diabetes    SVT (supraventricular tachycardia) (HCC)     Past Surgical History:  Procedure Laterality Date   BREAST BIOPSY Right    Benign approx age 61   cervical dysplasia laser     dermoid cyst removal     IR GENERIC HISTORICAL  06/04/2014   IR RADIOLOGIST EVAL & MGMT 06/04/2014 Sandi Mariscal, MD [facility] INTERV RAD   LAPAROSCOPIC CHOLECYSTECTOMY     LIPOMA RESECTION     and redone   ORIF ANKLE FRACTURE  08/09/2011   Procedure: OPEN REDUCTION INTERNAL FIXATION (ORIF) ANKLE FRACTURE;  Surgeon: Wylene Simmer, MD;  Location: [facility];  Service: Orthopedics;  Laterality: Right;  ORIF Right Ankle Lateral Malleolus   scar revision for the lipoma     TOTAL KNEE ARTHROPLASTY Right 02/23/2021   Procedure: RIGHT TOTAL KNEE ARTHROPLASTY;  Surgeon: Melrose Nakayama, MD;  Location: [facility];  Service: Orthopedics;   Laterality: Right;   WISDOM TOOTH EXTRACTION      There were no vitals filed for this visit.   Subjective Assessment - 03/18/21 1541     Subjective Patient doing well, said PA says to work on extension, has been doing her exercises.  Felt the dry needling last session helped significantly with swelling and made walking easier    Pertinent History PMH - pre-diabetes, obesity, palpitations (SVT)    Diagnostic tests X-ray    Patient Stated Goals walk completely normal, return to exercise    Currently in Pain? Yes    Pain Score 1     Pain Location Knee    Pain Orientation Right    Pain Descriptors / Indicators Discomfort                [facility] PT Assessment - 03/18/21 0001       PROM   Right Knee Flexion 110                           [facility] Adult PT Treatment/Exercise - 03/18/21 0001       Exercises   Exercises Knee/Hip  Knee/Hip Exercises: Stretches   Knee: Self-Stretch to increase Flexion Right;3 reps;20 seconds    Knee: Self-Stretch Limitations on step, cues to keep heel down    Other Knee/Hip Stretches runners stretch while PT mobilizing R tibia into ER to improve extension.      Knee/Hip Exercises: Aerobic   Recumbent Bike L2x69min      Knee/Hip Exercises: Standing   Terminal Knee Extension Strengthening;Right;2 sets;10 reps;Theraband    Theraband Level (Terminal Knee Extension) Level 3 ([place])      Knee/Hip Exercises: Prone   Other Prone Exercises contract/relax for quad strengthening and ROM 6 x 5 sec hold      Modalities   Modalities Vasopneumatic      Vasopneumatic   Number Minutes Vasopneumatic  10 minutes    Vasopnuematic Location  Knee    Vasopneumatic Pressure Low    Vasopneumatic Temperature  34      Manual Therapy   Manual Therapy Soft tissue mobilization;Passive ROM;Other (comment)    Manual therapy comments to improve R knee flexion    Soft tissue mobilization STM to hamstings, quads, and gastroc/soleus    Passive ROM R  knee flexion stretch in prone with hip hyperextended, rotational movements to relax glutes    Other Manual Therapy skilled palpation and monitoring with dry needling              Trigger Point Dry Needling - 03/18/21 0001     Consent Given? Yes    Education Handout Provided Previously provided    Muscles Treated Lower Quadrant Rectus femoris;Vastus lateralis;Gastrocnemius;Soleus    Dry Needling Comments right side    Rectus femoris Response Twitch response elicited;Palpable increased muscle length    Vastus lateralis Response Twitch response elicited;Palpable increased muscle length    Gastrocnemius Response Twitch response elicited;Palpable increased muscle length    Soleus Response Twitch response elicited;Palpable increased muscle length                     PT Short Term Goals - 03/12/21 3009       PT SHORT TERM GOAL #1   Title Patient to be independent with initial HEP.    Time 2    Period Weeks    Status Achieved   03/12/21   Target Date 03/11/21               PT Long Term Goals - 03/16/21 1203       PT LONG TERM GOAL #1   Title Patient to be independent with advanced HEP.    Time 6    Period Weeks    Status On-going   03/16/2021- met for current, very compliant.   Target Date 04/08/21      PT LONG TERM GOAL #2   Title Pt. will demonstrate 0-120 deg R knee ROM for safety with gait and stairs.    Baseline 15-78    Time 6    Period Weeks    Status On-going   12/19- 100 deg R knee flexion   Target Date 04/08/21      PT LONG TERM GOAL #3   Title Pt. will demonstrate 5/5 RLE strength for safety with gait.    Baseline significant R quad lag    Time 6    Period Weeks    Status On-going    Target Date 04/08/21      PT LONG TERM GOAL #4   Title Patient will be able to ambulate 900' without AD and with  good safety for community ambulation.    Baseline slow antalgic gait with 2WRW    Time 6    Period Weeks    Status On-going   03/16/2020- using  [facility]   Target Date 04/08/21      PT LONG TERM GOAL #5   Title Pt. will be able to ascend/descend 1 flight of stairs with 1 HR and good safety using reciprocal step to access home.    Baseline unable    Time 6    Period Weeks    Status On-going   03/16/20- step to gait up and down stairs with 1 HR   Target Date 04/08/21                   Plan - 03/18/21 1802     Clinical Impression Statement Patient reported more stiffness on arrival today, only 100 deg knee flexion at beginning of session.  Continued focus on ROM for both flexion and extension, added knee flexion stretch on step and TKE with GTB, followed by manual therapy with passive stretching with hip hyperextended to stretch rectus femoris, which she did have difficulty tolerating.  Also dry needed quads and calf today, as these areas are still very tight.  Reported decreased tightness and was able to flex knee to 110 at end of session.  Game ready at end for post exercise soreness and edema.    Personal Factors and Comorbidities Comorbidity 2    Comorbidities HTN, history SVT, pre-diabetes    PT Frequency --   2-3x per week   PT Duration 6 weeks    PT Treatment/Interventions ADLs/Self Care Home Management;Cryotherapy;Electrical Stimulation;Gait training;Stair training;Functional mobility training;Therapeutic activities;Therapeutic exercise;Balance training;Neuromuscular re-education;Patient/family education;Manual techniques;Scar mobilization;Passive range of motion;Dry needling;Taping;Vasopneumatic Device;Joint Manipulations    PT Next Visit Plan assess right hip flexor tightness; progress strengthening and ROM, modalities PRN    PT Home Exercise Plan Access Code: [email or web address]    Consulted and Agree with Plan of Care Patient             Patient will benefit from skilled therapeutic intervention in order to improve the following deficits and impairments:  Abnormal gait, Decreased endurance, Decreased skin integrity, Difficulty  walking, Increased muscle spasms, Decreased range of motion, Decreased scar mobility, Increased edema, Decreased activity tolerance, Decreased strength, Increased fascial restricitons, Impaired flexibility, Pain, Decreased balance, Decreased mobility  Visit Diagnosis: Acute pain of right knee  Stiffness of right knee, not elsewhere classified  Localized edema  Muscle weakness (generalized)  Difficulty in walking, not elsewhere classified     Problem List Patient Active Problem List   Diagnosis Date Noted   Primary osteoarthritis of right knee 02/23/2021   Other hyperlipidemia 12/22/2020   Prediabetes 05/13/2020   Arthritis of right knee 05/13/2020   Asymptomatic varicose veins of bilateral lower extremities 05/13/2020   Dysplastic nevus 05/13/2020   Elevated erythrocyte sedimentation rate 05/13/2020   Gout 05/13/2020   Gouty arthropathy 05/13/2020   Hot flashes 05/13/2020   Hyperglycemia 05/13/2020   Insomnia 05/13/2020   Menopause 05/13/2020   Iron deficiency anemia due to chronic blood loss 05/13/2020   Pica 05/13/2020   Pure hypercholesterolemia 05/13/2020   Right knee pain 05/13/2020   Sacroiliitis, not elsewhere classified ([place]) 05/13/2020   Vitamin D insufficiency 05/13/2020   Class 1 obesity with serious comorbidity and body mass index (BMI) of 33.0 to 33.9 in adult    Essential hypertension 08/03/2017   Osteoarthritis of right knee 05/20/2015   Fibroid, uterine  Fever 09/30/2010   Supraventricular tachycardia-probable concealed accessory pathway 08/31/2010   Stress 08/31/2010   ALLERGIC RHINITIS 11/20/2008   GERD 11/20/2008   PALPITATIONS 11/20/2008   CHEST PAIN 11/20/2008    Rennie Natter, PT, DPT  03/18/2021, 6:07 PM  [facility] [address]  [place], [place], [address] Phone: [phone]   Fax:  [phone]  Name: KILEA MCCAREY MRN: [id number] Date of Birth:  1965-10-28

## 2021-03-18 NOTE — Progress Notes (Signed)
TeleHealth Visit:  Due to the COVID-19 pandemic, this visit was completed with telemedicine (audio/video) technology to reduce patient and provider exposure as well as to preserve personal protective equipment.   Kimberly Boyd has verbally consented to this TeleHealth visit. The patient is located at home, the provider is located at the [facility] office. The participants in this visit include the listed provider and patient. The visit was conducted today via video.  Chief Complaint: OBESITY Kimberly Boyd is here to discuss her progress with her obesity treatment plan along with follow-up of her obesity related diagnoses. Kimberly Boyd is on practicing portion control and making smarter food choices, such as increasing vegetables and decreasing simple carbohydrates and states she is following her eating plan approximately 80% of the time. Kimberly Boyd states she is doing physical therapy and weights for 30 minutes 5-6 times per week.  Today's visit was #: 13 Starting weight: 213 lbs Starting date: 05/12/2020  Interim History: Kimberly Boyd had a total knee replacement 02-23-2021. She feels she has lost some weight. Her appetite is down. Her protein intake is adequate. She notes water intake could be better.   Subjective:   1. Status post total right knee replacement Kimberly Boyd had right total knee replacement 02-23-2021 and is progressing well with physical therapy.  Assessment/Plan:   1. Status post total right knee replacement Kimberly Boyd will follow up with orthopedics as directed.   2. Obesity: Current BMI 30.69 Kimberly Boyd is currently in the action stage of change. As such, her goal is to continue with weight loss efforts. She has agreed to practicing portion control and making smarter food choices, such as increasing vegetables and decreasing simple carbohydrates.   Exercise goals:  Kimberly Boyd will continue physical therapy.  Behavioral modification strategies: planning for success. Increase water  intake.  Kimberly Boyd has agreed to follow-up with our clinic in 4 weeks. She will call for an appointment.  Objective:   VITALS: Per patient if applicable, see vitals. GENERAL: Alert and in no acute distress. CARDIOPULMONARY: No increased WOB. Speaking in clear sentences.  PSYCH: Pleasant and cooperative. Speech normal rate and rhythm. Affect is appropriate. Insight and judgement are appropriate. Attention is focused, linear, and appropriate.  NEURO: Oriented as arrived to appointment on time with no prompting.   Lab Results  Component Value Date   CREATININE 0.55 02/20/2021   BUN 19 02/20/2021   NA 139 02/20/2021   K 3.0 (L) 02/20/2021   CL 105 02/20/2021   CO2 26 02/20/2021   Lab Results  Component Value Date   ALT 24 02/20/2021   AST 19 02/20/2021   ALKPHOS 67 02/20/2021   BILITOT 0.2 (L) 02/20/2021   Lab Results  Component Value Date   HGBA1C 6.0 (H) 02/12/2021   HGBA1C 5.9 (H) 11/24/2020   HGBA1C 6.2 (H) 05/12/2020   Lab Results  Component Value Date   INSULIN 7.4 11/24/2020   INSULIN 12.4 05/12/2020   Lab Results  Component Value Date   TSH 2.300 02/20/2021   Lab Results  Component Value Date   CHOL 180 11/24/2020   HDL 59 11/24/2020   LDLCALC 110 (H) 11/24/2020   TRIG 57 11/24/2020   Lab Results  Component Value Date   VD25OH 36.3 11/24/2020   VD25OH 40.8 05/12/2020   Lab Results  Component Value Date   WBC 6.7 02/20/2021   HGB 12.5 02/20/2021   HCT 36.6 02/20/2021   MCV 90.6 02/20/2021   PLT 315 02/20/2021   No results found for: IRON,  TIBC, FERRITIN  Attestation Statements:   Reviewed by clinician on day of visit: allergies, medications, problem list, medical history, surgical history, family history, social history, and previous encounter notes.  I, Lizbeth Bark, RMA, am acting as Location manager for [employer], [place].  I have reviewed the above documentation for accuracy and completeness, and I agree with the above. - Georgianne Fick, FNP

## 2021-03-19 ENCOUNTER — Ambulatory Visit: Payer: 59

## 2021-03-19 DIAGNOSIS — M25561 Pain in right knee: Secondary | ICD-10-CM | POA: Diagnosis not present

## 2021-03-19 DIAGNOSIS — R262 Difficulty in walking, not elsewhere classified: Secondary | ICD-10-CM | POA: Diagnosis not present

## 2021-03-19 DIAGNOSIS — M25661 Stiffness of right knee, not elsewhere classified: Secondary | ICD-10-CM

## 2021-03-19 DIAGNOSIS — M6281 Muscle weakness (generalized): Secondary | ICD-10-CM

## 2021-03-19 DIAGNOSIS — R600 Localized edema: Secondary | ICD-10-CM | POA: Diagnosis not present

## 2021-03-19 NOTE — Therapy (Signed)
[facility] [address]  [place], [place], [address] Phone: [phone]   Fax:  [phone]  Physical Therapy Treatment  Patient Details  Name: Kimberly Boyd MRN: [id number] Date of Birth: 1965-11-09 Referring Provider (PT): Melrose Nakayama MD   Encounter Date: 03/19/2021   PT End of Session - 03/19/21 1145     Visit Number 9    Number of Visits 18    Date for PT Re-Evaluation 04/08/21    Authorization Type [facility] VL 25    PT Start Time 1017    PT Stop Time 1101    PT Time Calculation (min) 44 min    Activity Tolerance Patient tolerated treatment well    Behavior During Therapy WFL for tasks assessed/performed             Past Medical History:  Diagnosis Date   Anemia    Anxiety    Arthritis    Arthritis of left foot    Arthritis of right knee    Back pain years.    chronic low back pain    Chronic back pain    Complication of anesthesia    tachycardia, difficulty waking up   GERD (gastroesophageal reflux disease)    Heart murmur    questionable   Hypertension    Obesity    Palpitations    Pre-diabetes    SVT (supraventricular tachycardia) (HCC)     Past Surgical History:  Procedure Laterality Date   BREAST BIOPSY Right    Benign approx age 12   cervical dysplasia laser     dermoid cyst removal     IR GENERIC HISTORICAL  06/04/2014   IR RADIOLOGIST EVAL & MGMT 06/04/2014 Sandi Mariscal, MD [facility] INTERV RAD   LAPAROSCOPIC CHOLECYSTECTOMY     LIPOMA RESECTION     and redone   ORIF ANKLE FRACTURE  08/09/2011   Procedure: OPEN REDUCTION INTERNAL FIXATION (ORIF) ANKLE FRACTURE;  Surgeon: Wylene Simmer, MD;  Location: [facility];  Service: Orthopedics;  Laterality: Right;  ORIF Right Ankle Lateral Malleolus   scar revision for the lipoma     TOTAL KNEE ARTHROPLASTY Right 02/23/2021   Procedure: RIGHT TOTAL KNEE ARTHROPLASTY;  Surgeon: Melrose Nakayama, MD;  Location: [facility];  Service: Orthopedics;   Laterality: Right;   WISDOM TOOTH EXTRACTION      There were no vitals filed for this visit.   Subjective Assessment - 03/19/21 1020     Subjective Pt reports that she has been weaning herself from cane around the house.    Pertinent History PMH - pre-diabetes, obesity, palpitations (SVT)    Diagnostic tests X-ray    Patient Stated Goals walk completely normal, return to exercise    Currently in Pain? Yes    Pain Score 1     Pain Location Knee    Pain Orientation Right    Pain Descriptors / Indicators Tightness    Pain Type Surgical pain                               [facility] Adult PT Treatment/Exercise - 03/19/21 0001       Knee/Hip Exercises: Stretches   Passive Hamstring Stretch Right;20 seconds;3 reps    Passive Hamstring Stretch Limitations supine      Knee/Hip Exercises: Aerobic   Recumbent Bike L2x76mn      Knee/Hip Exercises: Supine   Quad Sets Strengthening;Right;5 sets  Quad Sets Limitations 10" hold with therapist OP    Straight Leg Raises Strengthening;Right;10 reps      Manual Therapy   Manual Therapy Soft tissue mobilization;Other (comment)    Soft tissue mobilization STM to hamstings and gastroc    Passive ROM into knee extension with leg propped up                       PT Short Term Goals - 03/12/21 3419       PT SHORT TERM GOAL #1   Title Patient to be independent with initial HEP.    Time 2    Period Weeks    Status Achieved   03/12/21   Target Date 03/11/21               PT Long Term Goals - 03/16/21 1203       PT LONG TERM GOAL #1   Title Patient to be independent with advanced HEP.    Time 6    Period Weeks    Status On-going   03/16/2021- met for current, very compliant.   Target Date 04/08/21      PT LONG TERM GOAL #2   Title Pt. will demonstrate 0-120 deg R knee ROM for safety with gait and stairs.    Baseline 15-78    Time 6    Period Weeks    Status On-going   12/19- 100 deg R knee  flexion   Target Date 04/08/21      PT LONG TERM GOAL #3   Title Pt. will demonstrate 5/5 RLE strength for safety with gait.    Baseline significant R quad lag    Time 6    Period Weeks    Status On-going    Target Date 04/08/21      PT LONG TERM GOAL #4   Title Patient will be able to ambulate 900' without AD and with good safety for community ambulation.    Baseline slow antalgic gait with 2WRW    Time 6    Period Weeks    Status On-going   03/16/2020- using [facility]   Target Date 04/08/21      PT LONG TERM GOAL #5   Title Pt. will be able to ascend/descend 1 flight of stairs with 1 HR and good safety using reciprocal step to access home.    Baseline unable    Time 6    Period Weeks    Status On-going   03/16/20- step to gait up and down stairs with 1 HR   Target Date 04/08/21                   Plan - 03/19/21 1111     Clinical Impression Statement Pt still lacks full knee extension, pre session it was measured 10 deg but after STM, stretches, and other exercises it was 7 deg in supine. After the manual work she showed a smoother gait pattern with better symmetrical step length. She noted improvements from the DN but still with muscle restrictions in the posterior knee.    Personal Factors and Comorbidities Comorbidity 2    Comorbidities HTN, history SVT, pre-diabetes    PT Frequency --   2-3x per week   PT Duration 6 weeks    PT Treatment/Interventions ADLs/Self Care Home Management;Cryotherapy;Electrical Stimulation;Gait training;Stair training;Functional mobility training;Therapeutic activities;Therapeutic exercise;Balance training;Neuromuscular re-education;Patient/family education;Manual techniques;Scar mobilization;Passive range of motion;Dry needling;Taping;Vasopneumatic Device;Joint Manipulations    PT Next Visit Plan work  on improving extension, modalities PRN    PT Home Exercise Plan Access Code: [email or web address]    Consulted and Agree with Plan of Care Patient              Patient will benefit from skilled therapeutic intervention in order to improve the following deficits and impairments:  Abnormal gait, Decreased endurance, Decreased skin integrity, Difficulty walking, Increased muscle spasms, Decreased range of motion, Decreased scar mobility, Increased edema, Decreased activity tolerance, Decreased strength, Increased fascial restricitons, Impaired flexibility, Pain, Decreased balance, Decreased mobility  Visit Diagnosis: Acute pain of right knee  Stiffness of right knee, not elsewhere classified  Localized edema  Muscle weakness (generalized)  Difficulty in walking, not elsewhere classified     Problem List Patient Active Problem List   Diagnosis Date Noted   Primary osteoarthritis of right knee 02/23/2021   Other hyperlipidemia 12/22/2020   Prediabetes 05/13/2020   Arthritis of right knee 05/13/2020   Asymptomatic varicose veins of bilateral lower extremities 05/13/2020   Dysplastic nevus 05/13/2020   Elevated erythrocyte sedimentation rate 05/13/2020   Gout 05/13/2020   Gouty arthropathy 05/13/2020   Hot flashes 05/13/2020   Hyperglycemia 05/13/2020   Insomnia 05/13/2020   Menopause 05/13/2020   Iron deficiency anemia due to chronic blood loss 05/13/2020   Pica 05/13/2020   Pure hypercholesterolemia 05/13/2020   Right knee pain 05/13/2020   Sacroiliitis, not elsewhere classified ([place]) 05/13/2020   Vitamin D insufficiency 05/13/2020   Class 1 obesity with serious comorbidity and body mass index (BMI) of 33.0 to 33.9 in adult    Essential hypertension 08/03/2017   Osteoarthritis of right knee 05/20/2015   Fibroid, uterine    Fever 09/30/2010   Supraventricular tachycardia-probable concealed accessory pathway 08/31/2010   Stress 08/31/2010   ALLERGIC RHINITIS 11/20/2008   GERD 11/20/2008   PALPITATIONS 11/20/2008   CHEST PAIN 11/20/2008    Artist Pais, PTA 03/19/2021, 11:47 AM  [facility] [address]  [place], [place], [address] Phone: [phone]   Fax:  [phone]  Name: Kimberly Boyd MRN: [id number] Date of Birth: 28-Jul-1965

## 2021-03-22 ENCOUNTER — Ambulatory Visit: Payer: 59

## 2021-03-22 ENCOUNTER — Other Ambulatory Visit: Payer: Self-pay

## 2021-03-22 DIAGNOSIS — M25561 Pain in right knee: Secondary | ICD-10-CM

## 2021-03-22 DIAGNOSIS — R262 Difficulty in walking, not elsewhere classified: Secondary | ICD-10-CM

## 2021-03-22 DIAGNOSIS — M6281 Muscle weakness (generalized): Secondary | ICD-10-CM

## 2021-03-22 DIAGNOSIS — R600 Localized edema: Secondary | ICD-10-CM | POA: Diagnosis not present

## 2021-03-22 DIAGNOSIS — M25661 Stiffness of right knee, not elsewhere classified: Secondary | ICD-10-CM

## 2021-03-22 NOTE — Therapy (Signed)
[facility] [address]  [place], [place], [address] Phone: [phone]   Fax:  [phone]  Physical Therapy Treatment  Patient Details  Name: Kimberly Boyd MRN: [id number] Date of Birth: 05/10/1965 Referring Provider (PT): Melrose Nakayama MD   Encounter Date: 03/22/2021   PT End of Session - 03/22/21 1136     Visit Number 10    Number of Visits 18    Date for PT Re-Evaluation 04/08/21    Authorization Type [facility] 25    PT Start Time 1018    PT Stop Time 1100    PT Time Calculation (min) 42 min    Activity Tolerance Patient tolerated treatment well    Behavior During Therapy WFL for tasks assessed/performed             Past Medical History:  Diagnosis Date   Anemia    Anxiety    Arthritis    Arthritis of left foot    Arthritis of right knee    Back pain years.    chronic low back pain    Chronic back pain    Complication of anesthesia    tachycardia, difficulty waking up   GERD (gastroesophageal reflux disease)    Heart murmur    questionable   Hypertension    Obesity    Palpitations    Pre-diabetes    SVT (supraventricular tachycardia) (HCC)     Past Surgical History:  Procedure Laterality Date   BREAST BIOPSY Right    Benign approx age 18   cervical dysplasia laser     dermoid cyst removal     IR GENERIC HISTORICAL  06/04/2014   IR RADIOLOGIST EVAL & MGMT 06/04/2014 Sandi Mariscal, MD [facility] INTERV RAD   LAPAROSCOPIC CHOLECYSTECTOMY     LIPOMA RESECTION     and redone   ORIF ANKLE FRACTURE  08/09/2011   Procedure: OPEN REDUCTION INTERNAL FIXATION (ORIF) ANKLE FRACTURE;  Surgeon: Wylene Simmer, MD;  Location: [facility];  Service: Orthopedics;  Laterality: Right;  ORIF Right Ankle Lateral Malleolus   scar revision for the lipoma     TOTAL KNEE ARTHROPLASTY Right 02/23/2021   Procedure: RIGHT TOTAL KNEE ARTHROPLASTY;  Surgeon: Melrose Nakayama, MD;  [facility];  Service: Orthopedics;   Laterality: Right;   WISDOM TOOTH EXTRACTION      There were no vitals filed for this visit.   Subjective Assessment - 03/22/21 1021     Subjective Pt reports that after the last session she was sore from the massage, she worked it out a bit and stretched and that seemed to help.    Pertinent History PMH - pre-diabetes, obesity, palpitations (SVT)    Diagnostic tests X-ray    Patient Stated Goals walk completely normal, return to exercise    Currently in Pain? No/denies                               [facility] Adult PT Treatment/Exercise - 03/22/21 0001       Knee/Hip Exercises: Aerobic   Recumbent Bike L2x43mn      Knee/Hip Exercises: Supine   Quad Sets Strengthening;Right;5 sets    Quad Sets Limitations 10" hold with therapist OP    Short Arc Quad Sets Strengthening;Right;2 sets;10 reps    Short Arc Quad Sets Limitations bloster under knee    Straight Leg Raises Strengthening;Right;10 reps;2 sets  Manual Therapy   Manual Therapy Soft tissue mobilization    Soft tissue mobilization STM to hamstings and gastroc    Passive ROM into knee extension with leg propped up                       PT Short Term Goals - 03/12/21 0109       PT SHORT TERM GOAL #1   Title Patient to be independent with initial HEP.    Time 2    Period Weeks    Status Achieved   03/12/21   Target Date 03/11/21               PT Long Term Goals - 03/16/21 1203       PT LONG TERM GOAL #1   Title Patient to be independent with advanced HEP.    Time 6    Period Weeks    Status On-going   03/16/2021- met for current, very compliant.   Target Date 04/08/21      PT LONG TERM GOAL #2   Title Pt. will demonstrate 0-120 deg R knee ROM for safety with gait and stairs.    Baseline 15-78    Time 6    Period Weeks    Status On-going   12/19- 100 deg R knee flexion   Target Date 04/08/21      PT LONG TERM GOAL #3   Title Pt. will demonstrate 5/5 RLE strength for  safety with gait.    Baseline significant R quad lag    Time 6    Period Weeks    Status On-going    Target Date 04/08/21      PT LONG TERM GOAL #4   Title Patient will be able to ambulate 900' without AD and with good safety for community ambulation.    Baseline slow antalgic gait with 2WRW    Time 6    Period Weeks    Status On-going   03/16/2020- using [facility]   Target Date 04/08/21      PT LONG TERM GOAL #5   Title Pt. will be able to ascend/descend 1 flight of stairs with 1 HR and good safety using reciprocal step to access home.    Baseline unable    Time 6    Period Weeks    Status On-going   03/16/20- step to gait up and down stairs with 1 HR   Target Date 04/08/21                   Plan - 03/22/21 1132     Clinical Impression Statement Pt was able to extend her R knee to 5 deg post session. Focused interventions on strengthening the quads to improve knee extension and knee stability. She does become fatigued with NWB quad exercises but able to progress with more repetition. Cues needed for TKE with QS and for contraction with SAQ.    Personal Factors and Comorbidities Comorbidity 2    Comorbidities HTN, history SVT, pre-diabetes    PT Frequency --   2-3 per week   PT Duration 6 weeks    PT Treatment/Interventions ADLs/Self Care Home Management;Cryotherapy;Electrical Stimulation;Gait training;Stair training;Functional mobility training;Therapeutic activities;Therapeutic exercise;Balance training;Neuromuscular re-education;Patient/family education;Manual techniques;Scar mobilization;Passive range of motion;Dry needling;Taping;Vasopneumatic Device;Joint Manipulations    PT Next Visit Plan work on improving extension, modalities PRN    PT Home Exercise Plan Access Code: [email or web address]    Consulted and Agree with Plan of Care Patient  Patient will benefit from skilled therapeutic intervention in order to improve the following deficits and impairments:  Abnormal  gait, Decreased endurance, Decreased skin integrity, Difficulty walking, Increased muscle spasms, Decreased range of motion, Decreased scar mobility, Increased edema, Decreased activity tolerance, Decreased strength, Increased fascial restricitons, Impaired flexibility, Pain, Decreased balance, Decreased mobility  Visit Diagnosis: Acute pain of right knee  Stiffness of right knee, not elsewhere classified  Localized edema  Muscle weakness (generalized)  Difficulty in walking, not elsewhere classified     Problem List Patient Active Problem List   Diagnosis Date Noted   Primary osteoarthritis of right knee 02/23/2021   Other hyperlipidemia 12/22/2020   Prediabetes 05/13/2020   Arthritis of right knee 05/13/2020   Asymptomatic varicose veins of bilateral lower extremities 05/13/2020   Dysplastic nevus 05/13/2020   Elevated erythrocyte sedimentation rate 05/13/2020   Gout 05/13/2020   Gouty arthropathy 05/13/2020   Hot flashes 05/13/2020   Hyperglycemia 05/13/2020   Insomnia 05/13/2020   Menopause 05/13/2020   Iron deficiency anemia due to chronic blood loss 05/13/2020   Pica 05/13/2020   Pure hypercholesterolemia 05/13/2020   Right knee pain 05/13/2020   Sacroiliitis, not elsewhere classified ([place]) 05/13/2020   Vitamin D insufficiency 05/13/2020   Class 1 obesity with serious comorbidity and body mass index (BMI) of 33.0 to 33.9 in adult    Essential hypertension 08/03/2017   Osteoarthritis of right knee 05/20/2015   Fibroid, uterine    Fever 09/30/2010   Supraventricular tachycardia-probable concealed accessory pathway 08/31/2010   Stress 08/31/2010   ALLERGIC RHINITIS 11/20/2008   GERD 11/20/2008   PALPITATIONS 11/20/2008   CHEST PAIN 11/20/2008    Artist Pais, PTA 03/22/2021, 11:40 AM  [facility] [address]  [place], [place], [address] Phone: [phone]   Fax:  [phone]  Name: Kimberly Boyd MRN: [id number] Date of Birth: Oct 25, 1965

## 2021-03-23 ENCOUNTER — Encounter: Payer: Self-pay | Admitting: Physical Therapy

## 2021-03-23 ENCOUNTER — Ambulatory Visit: Payer: 59 | Admitting: Physical Therapy

## 2021-03-23 DIAGNOSIS — R262 Difficulty in walking, not elsewhere classified: Secondary | ICD-10-CM

## 2021-03-23 DIAGNOSIS — M25561 Pain in right knee: Secondary | ICD-10-CM

## 2021-03-23 DIAGNOSIS — M6281 Muscle weakness (generalized): Secondary | ICD-10-CM

## 2021-03-23 DIAGNOSIS — M25661 Stiffness of right knee, not elsewhere classified: Secondary | ICD-10-CM

## 2021-03-23 DIAGNOSIS — R600 Localized edema: Secondary | ICD-10-CM

## 2021-03-23 NOTE — Therapy (Signed)
[facility] [address]  [place], [place], [address] Phone: [phone]   Fax:  [phone]  Physical Therapy Treatment  Patient Details  Name: Kimberly Boyd MRN: [id number] Date of Birth: [birth year] Referring Provider (PT): Melrose Nakayama MD   Encounter Date: 03/23/2021   PT End of Session - 03/23/21 1318     Visit Number 11    Number of Visits 18    Date for PT Re-Evaluation 04/08/21    Authorization Type [facility] 25    PT Start Time 1315    PT Stop Time 1410    PT Time Calculation (min) 55 min    Activity Tolerance Patient tolerated treatment well    Behavior During Therapy WFL for tasks assessed/performed             Past Medical History:  Diagnosis Date   Anemia    Anxiety    Arthritis    Arthritis of left foot    Arthritis of right knee    Back pain years.    chronic low back pain    Chronic back pain    Complication of anesthesia    tachycardia, difficulty waking up   GERD (gastroesophageal reflux disease)    Heart murmur    questionable   Hypertension    Obesity    Palpitations    Pre-diabetes    SVT (supraventricular tachycardia) (HCC)     Past Surgical History:  Procedure Laterality Date   BREAST BIOPSY Right    Benign approx age 59   cervical dysplasia laser     dermoid cyst removal     IR GENERIC HISTORICAL  06/04/2014   IR RADIOLOGIST EVAL & MGMT 06/04/2014 Sandi Mariscal, MD [facility] INTERV RAD   LAPAROSCOPIC CHOLECYSTECTOMY     LIPOMA RESECTION     and redone   ORIF ANKLE FRACTURE  08/09/2011   Procedure: OPEN REDUCTION INTERNAL FIXATION (ORIF) ANKLE FRACTURE;  Surgeon: Wylene Simmer, MD;  Location: [facility];  Service: Orthopedics;  Laterality: Right;  ORIF Right Ankle Lateral Malleolus   scar revision for the lipoma     TOTAL KNEE ARTHROPLASTY Right 02/23/2021   Procedure: RIGHT TOTAL KNEE ARTHROPLASTY;  Surgeon: Melrose Nakayama, MD;  [facility];  Service: Orthopedics;   Laterality: Right;   WISDOM TOOTH EXTRACTION      There were no vitals filed for this visit.   Subjective Assessment - 03/23/21 1316     Subjective Patient reports she is doing well, no new concerns.  Some tightness in knee with walking.    Pertinent History PMH - pre-diabetes, obesity, palpitations (SVT)    Diagnostic tests X-ray    Patient Stated Goals walk completely normal, return to exercise    Currently in Pain? No/denies                               [facility] Adult PT Treatment/Exercise - 03/23/21 0001       Knee/Hip Exercises: Stretches   Passive Hamstring Stretch Right;20 seconds;3 reps    Passive Hamstring Stretch Limitations runners stretch      Knee/Hip Exercises: Aerobic   Recumbent Bike L2x68mn      Knee/Hip Exercises: Machines for Strengthening   Cybex Knee Extension 15# 2 x 10    Cybex Knee Flexion 15# 1 x 10, 20# 2 x 10      Knee/Hip Exercises: Standing   Lateral  Step Up Both;2 sets;10 reps;Hand Hold: 2;Step Height: 6"    Lateral Step Up Limitations focusing on eccentric control with stepping down    Forward Step Up 2 sets;10 reps;Both;Step Height: 6"    Gait Training x 600' without AD, cues for heel strike      Modalities   Modalities Vasopneumatic      Vasopneumatic   Number Minutes Vasopneumatic  10 minutes    Vasopnuematic Location  Knee    Vasopneumatic Pressure Low    Vasopneumatic Temperature  34      Manual Therapy   Manual Therapy Joint mobilization;Soft tissue mobilization;Passive ROM    Manual therapy comments to improve R knee ROM    Joint Mobilization PA tibial glides on femur, patellar mobs    Soft tissue mobilization scar tissue mobilization with s/s tools, STM to m/l hamstrings    Passive ROM into knee extension with leg propped up                       PT Short Term Goals - 03/12/21 8466       PT SHORT TERM GOAL #1   Title Patient to be independent with initial HEP.    Time 2    Period Weeks     Status Achieved   03/12/21   Target Date 03/11/21               PT Long Term Goals - 03/16/21 1203       PT LONG TERM GOAL #1   Title Patient to be independent with advanced HEP.    Time 6    Period Weeks    Status On-going   03/16/2021- met for current, very compliant.   Target Date 04/08/21      PT LONG TERM GOAL #2   Title Pt. will demonstrate 0-120 deg R knee ROM for safety with gait and stairs.    Baseline 15-78    Time 6    Period Weeks    Status On-going   12/19- 100 deg R knee flexion   Target Date 04/08/21      PT LONG TERM GOAL #3   Title Pt. will demonstrate 5/5 RLE strength for safety with gait.    Baseline significant R quad lag    Time 6    Period Weeks    Status On-going    Target Date 04/08/21      PT LONG TERM GOAL #4   Title Patient will be able to ambulate 900' without AD and with good safety for community ambulation.    Baseline slow antalgic gait with 2WRW    Time 6    Period Weeks    Status On-going   03/16/2020- using [facility]   Target Date 04/08/21      PT LONG TERM GOAL #5   Title Pt. will be able to ascend/descend 1 flight of stairs with 1 HR and good safety using reciprocal step to access home.    Baseline unable    Time 6    Period Weeks    Status On-going   03/16/20- step to gait up and down stairs with 1 HR   Target Date 04/08/21                   Plan - 03/23/21 1410     Clinical Impression Statement Patient tolerated progression of exercise today to include light resistance weight machines for knee flexion and extension.  Gait is also  improving, ambulated 600' without AD with good heel strike and equal step length, although still lacking terminal knee extension on R.  Flexion is improving, 112 deg today but still lacking 8 deg extension end of session.  Game ready at end to decrease post exercise soreness and edema.    Personal Factors and Comorbidities Comorbidity 2    Comorbidities HTN, history SVT, pre-diabetes    PT  Frequency --   2-3 per week   PT Duration 6 weeks    PT Treatment/Interventions ADLs/Self Care Home Management;Cryotherapy;Electrical Stimulation;Gait training;Stair training;Functional mobility training;Therapeutic activities;Therapeutic exercise;Balance training;Neuromuscular re-education;Patient/family education;Manual techniques;Scar mobilization;Passive range of motion;Dry needling;Taping;Vasopneumatic Device;Joint Manipulations    PT Next Visit Plan work on improving extension, modalities PRN    PT Home Exercise Plan Access Code: [email or web address]    Consulted and Agree with Plan of Care Patient             Patient will benefit from skilled therapeutic intervention in order to improve the following deficits and impairments:  Abnormal gait, Decreased endurance, Decreased skin integrity, Difficulty walking, Increased muscle spasms, Decreased range of motion, Decreased scar mobility, Increased edema, Decreased activity tolerance, Decreased strength, Increased fascial restricitons, Impaired flexibility, Pain, Decreased balance, Decreased mobility  Visit Diagnosis: Acute pain of right knee  Stiffness of right knee, not elsewhere classified  Localized edema  Muscle weakness (generalized)  Difficulty in walking, not elsewhere classified     Problem List Patient Active Problem List   Diagnosis Date Noted   Primary osteoarthritis of right knee 02/23/2021   Other hyperlipidemia 12/22/2020   Prediabetes 05/13/2020   Arthritis of right knee 05/13/2020   Asymptomatic varicose veins of bilateral lower extremities 05/13/2020   Dysplastic nevus 05/13/2020   Elevated erythrocyte sedimentation rate 05/13/2020   Gout 05/13/2020   Gouty arthropathy 05/13/2020   Hot flashes 05/13/2020   Hyperglycemia 05/13/2020   Insomnia 05/13/2020   Menopause 05/13/2020   Iron deficiency anemia due to chronic blood loss 05/13/2020   Pica 05/13/2020   Pure hypercholesterolemia 05/13/2020   Right knee pain  05/13/2020   Sacroiliitis, not elsewhere classified ([place]) 05/13/2020   Vitamin D insufficiency 05/13/2020   Class 1 obesity with serious comorbidity and body mass index (BMI) of 33.0 to 33.9 in adult    Essential hypertension 08/03/2017   Osteoarthritis of right knee 05/20/2015   Fibroid, uterine    Fever 09/30/2010   Supraventricular tachycardia-probable concealed accessory pathway 08/31/2010   Stress 08/31/2010   ALLERGIC RHINITIS 11/20/2008   GERD 11/20/2008   PALPITATIONS 11/20/2008   CHEST PAIN 11/20/2008    Rennie Natter, PT, DPT  03/23/2021, 2:14 PM  [facility] [address]  [place], [place], [address] Phone: [phone]   Fax:  [phone]  Name: Kimberly Boyd MRN: [id number] Date of Birth: [birth year]

## 2021-03-25 ENCOUNTER — Other Ambulatory Visit: Payer: Self-pay

## 2021-03-25 ENCOUNTER — Encounter: Payer: Self-pay | Admitting: Physical Therapy

## 2021-03-25 ENCOUNTER — Ambulatory Visit: Payer: 59 | Admitting: Physical Therapy

## 2021-03-25 DIAGNOSIS — M25661 Stiffness of right knee, not elsewhere classified: Secondary | ICD-10-CM

## 2021-03-25 DIAGNOSIS — R600 Localized edema: Secondary | ICD-10-CM

## 2021-03-25 DIAGNOSIS — M6281 Muscle weakness (generalized): Secondary | ICD-10-CM | POA: Diagnosis not present

## 2021-03-25 DIAGNOSIS — M25561 Pain in right knee: Secondary | ICD-10-CM

## 2021-03-25 DIAGNOSIS — R262 Difficulty in walking, not elsewhere classified: Secondary | ICD-10-CM | POA: Diagnosis not present

## 2021-03-25 NOTE — Patient Instructions (Signed)
Access Code: [email or web address] URL: [email or web address] Date: 03/25/2021 Prepared by: Glenetta Hew  Exercises  *added/reviewed Seated Quad Set - 3 x daily - 7 x weekly - 1 sets - 10 reps - 10 sec hold Standing Terminal Knee Extension at Wall with Ball - 1 x daily - 7 x weekly - 3 sets - 10 reps Standing Terminal Knee Extension with Resistance - 1 x daily - 7 x weekly - 2 sets - 10 reps - 10 sec hold

## 2021-03-25 NOTE — Therapy (Signed)
[facility] [address]  [place], [place], [address] Phone: [phone]   Fax:  [phone]  Physical Therapy Treatment  Patient Details  Name: Kimberly Boyd MRN: [id number] Date of Birth: [birth year] Referring Provider (PT): Melrose Nakayama MD   Encounter Date: 03/25/2021   PT End of Session - 03/25/21 1022     Visit Number 12    Number of Visits 18    Date for PT Re-Evaluation 04/08/21    Authorization Type [facility] VL 25    PT Start Time 1017    PT Stop Time 1117    PT Time Calculation (min) 60 min    Activity Tolerance Patient tolerated treatment well    Behavior During Therapy WFL for tasks assessed/performed             Past Medical History:  Diagnosis Date   Anemia    Anxiety    Arthritis    Arthritis of left foot    Arthritis of right knee    Back pain years.    chronic low back pain    Chronic back pain    Complication of anesthesia    tachycardia, difficulty waking up   GERD (gastroesophageal reflux disease)    Heart murmur    questionable   Hypertension    Obesity    Palpitations    Pre-diabetes    SVT (supraventricular tachycardia) (HCC)     Past Surgical History:  Procedure Laterality Date   BREAST BIOPSY Right    Benign approx age 2   cervical dysplasia laser     dermoid cyst removal     IR GENERIC HISTORICAL  06/04/2014   IR RADIOLOGIST EVAL & MGMT 06/04/2014 Sandi Mariscal, MD [facility] INTERV RAD   LAPAROSCOPIC CHOLECYSTECTOMY     LIPOMA RESECTION     and redone   ORIF ANKLE FRACTURE  08/09/2011   Procedure: OPEN REDUCTION INTERNAL FIXATION (ORIF) ANKLE FRACTURE;  Surgeon: Wylene Simmer, MD;  Location: [facility];  Service: Orthopedics;  Laterality: Right;  ORIF Right Ankle Lateral Malleolus   scar revision for the lipoma     TOTAL KNEE ARTHROPLASTY Right 02/23/2021   Procedure: RIGHT TOTAL KNEE ARTHROPLASTY;  Surgeon: Melrose Nakayama, MD;  Location: [facility];  Service: Orthopedics;   Laterality: Right;   WISDOM TOOTH EXTRACTION      There were no vitals filed for this visit.   Subjective Assessment - 03/25/21 1021     Subjective Pt. reports a lot of tightness in R calf today.    Pertinent History PMH - pre-diabetes, obesity, palpitations (SVT)    Diagnostic tests X-ray    Patient Stated Goals walk completely normal, return to exercise    Currently in Pain? Yes    Pain Score 2     Pain Location Knee    Pain Orientation Right                               [facility] Adult PT Treatment/Exercise - 03/25/21 0001       Ambulation/Gait   Stairs Yes    Stairs Assistance 6: Modified independent (Device/Increase time)    Stair Management Technique One rail Right   alternating up, step to down, last 2 steps stepping down alternating   Number of Stairs 12    Height of Stairs 8      Knee/Hip Exercises: Stretches   Passive Hamstring Stretch Right;20  seconds;3 reps    Passive Hamstring Stretch Limitations runners stretch, cues to keep heel down for gastroc stretch as well    Quad Stretch Right;2 reps;30 seconds    Quad Stretch Limitations on step      Knee/Hip Exercises: Aerobic   Recumbent Bike L2x42mn      Knee/Hip Exercises: Standing   Lateral Step Up Both;2 sets;10 reps;Hand Hold: 2;Step Height: 6"    Lateral Step Up Limitations focusing on eccentric control with stepping down    Forward Step Up 2 sets;20 reps;Hand Hold: 1;Both    Forward Step Up Limitations on full size step with HR      Modalities   Modalities Vasopneumatic      Vasopneumatic   Number Minutes Vasopneumatic  10 minutes    Vasopnuematic Location  Knee    Vasopneumatic Pressure Low    Vasopneumatic Temperature  34      Manual Therapy   Manual Therapy Soft tissue mobilization;Myofascial release;Other (comment)    Manual therapy comments to decrease muscle tightness and improve R ankle DF    Soft tissue mobilization IASTM with s/s tools to gastric and soleus    Myofascial  Release TPR to R gastoc    Other Manual Therapy skilled palpation and monitoring with dry needling              Trigger Point Dry Needling - 03/25/21 0001     Consent Given? Yes    Education Handout Provided Previously provided    Muscles Treated Lower Quadrant Hamstring;Gastrocnemius;Soleus    Dry Needling Comments right side    Hamstring Response Twitch response elicited;Palpable increased muscle length    Gastrocnemius Response Twitch response elicited;Palpable increased muscle length    Soleus Response Twitch response elicited;Palpable increased muscle length                   PT Education - 03/25/21 1119     Education Details progressed HEP Access Code: [place] issued GTB.    Person(s) Educated Patient    Methods Explanation;Demonstration;Handout;Verbal cues    Comprehension Verbalized understanding;Returned demonstration              PT Short Term Goals - 03/12/21 0808       PT SHORT TERM GOAL #1   Title Patient to be independent with initial HEP.    Time 2    Period Weeks    Status Achieved   03/12/21   Target Date 03/11/21               PT Long Term Goals - 03/16/21 1203       PT LONG TERM GOAL #1   Title Patient to be independent with advanced HEP.    Time 6    Period Weeks    Status On-going   03/16/2021- met for current, very compliant.   Target Date 04/08/21      PT LONG TERM GOAL #2   Title Pt. will demonstrate 0-120 deg R knee ROM for safety with gait and stairs.    Baseline 15-78    Time 6    Period Weeks    Status On-going   12/19- 100 deg R knee flexion   Target Date 04/08/21      PT LONG TERM GOAL #3   Title Pt. will demonstrate 5/5 RLE strength for safety with gait.    Baseline significant R quad lag    Time 6    Period Weeks  Status On-going    Target Date 04/08/21      PT LONG TERM GOAL #4   Title Patient will be able to ambulate 900' without AD and with good safety for community  ambulation.    Baseline slow antalgic gait with 2WRW    Time 6    Period Weeks    Status On-going   03/16/2020- using [facility]   Target Date 04/08/21      PT LONG TERM GOAL #5   Title Pt. will be able to ascend/descend 1 flight of stairs with 1 HR and good safety using reciprocal step to access home.    Baseline unable    Time 6    Period Weeks    Status On-going   03/16/20- step to gait up and down stairs with 1 HR   Target Date 04/08/21                   Plan - 03/25/21 1112     Clinical Impression Statement Patient has been feeling a little down about her progress, so discussed normal healing process and that she is doing well, but these feelings are very normal.  Encouraged to focus on self-care.  Today focused on stairs for both strengthening (especially quad strengthening) and TKE, followed by manual therapy and TrDN to R calf to decrease tightness and improve R ankle DF.  Game ready end of session for post exercise soreness/swelling.    Personal Factors and Comorbidities Comorbidity 2    Comorbidities HTN, history SVT, pre-diabetes    PT Frequency --   2-3 per week   PT Duration 6 weeks    PT Treatment/Interventions ADLs/Self Care Home Management;Cryotherapy;Electrical Stimulation;Gait training;Stair training;Functional mobility training;Therapeutic activities;Therapeutic exercise;Balance training;Neuromuscular re-education;Patient/family education;Manual techniques;Scar mobilization;Passive range of motion;Dry needling;Taping;Vasopneumatic Device;Joint Manipulations    PT Next Visit Plan work on improving extension, modalities PRN    PT Home Exercise Plan Access Code: [email or web address]    Consulted and Agree with Plan of Care Patient             Patient will benefit from skilled therapeutic intervention in order to improve the following deficits and impairments:  Abnormal gait, Decreased endurance, Decreased skin integrity, Difficulty walking, Increased muscle spasms, Decreased  range of motion, Decreased scar mobility, Increased edema, Decreased activity tolerance, Decreased strength, Increased fascial restricitons, Impaired flexibility, Pain, Decreased balance, Decreased mobility  Visit Diagnosis: Acute pain of right knee  Stiffness of right knee, not elsewhere classified  Localized edema  Muscle weakness (generalized)  Difficulty in walking, not elsewhere classified     Problem List Patient Active Problem List   Diagnosis Date Noted   Primary osteoarthritis of right knee 02/23/2021   Other hyperlipidemia 12/22/2020   Prediabetes 05/13/2020   Arthritis of right knee 05/13/2020   Asymptomatic varicose veins of bilateral lower extremities 05/13/2020   Dysplastic nevus 05/13/2020   Elevated erythrocyte sedimentation rate 05/13/2020   Gout 05/13/2020   Gouty arthropathy 05/13/2020   Hot flashes 05/13/2020   Hyperglycemia 05/13/2020   Insomnia 05/13/2020   Menopause 05/13/2020   Iron deficiency anemia due to chronic blood loss 05/13/2020   Pica 05/13/2020   Pure hypercholesterolemia 05/13/2020   Right knee pain 05/13/2020   Sacroiliitis, not elsewhere classified ([place]) 05/13/2020   Vitamin D insufficiency 05/13/2020   Class 1 obesity with serious comorbidity and body mass index (BMI) of 33.0 to 33.9 in adult    Essential hypertension 08/03/2017   Osteoarthritis of right knee 05/20/2015   Fibroid,  uterine    Fever 09/30/2010   Supraventricular tachycardia-probable concealed accessory pathway 08/31/2010   Stress 08/31/2010   ALLERGIC RHINITIS 11/20/2008   GERD 11/20/2008   PALPITATIONS 11/20/2008   CHEST PAIN 11/20/2008    Rennie Natter, PT, DPT  03/25/2021, 11:22 AM  [facility] [address]  [place], [place], [address] Phone: [phone]   Fax:  [phone]  Name: Kimberly Boyd MRN: [id number] Date of Birth: [birth year]

## 2021-03-29 ENCOUNTER — Ambulatory Visit: Payer: 59

## 2021-03-29 ENCOUNTER — Other Ambulatory Visit (HOSPITAL_COMMUNITY): Payer: Self-pay

## 2021-03-29 ENCOUNTER — Other Ambulatory Visit: Payer: Self-pay

## 2021-03-29 DIAGNOSIS — R600 Localized edema: Secondary | ICD-10-CM | POA: Diagnosis not present

## 2021-03-29 DIAGNOSIS — R262 Difficulty in walking, not elsewhere classified: Secondary | ICD-10-CM | POA: Diagnosis not present

## 2021-03-29 DIAGNOSIS — M25661 Stiffness of right knee, not elsewhere classified: Secondary | ICD-10-CM

## 2021-03-29 DIAGNOSIS — M6281 Muscle weakness (generalized): Secondary | ICD-10-CM | POA: Diagnosis not present

## 2021-03-29 DIAGNOSIS — M25561 Pain in right knee: Secondary | ICD-10-CM | POA: Diagnosis not present

## 2021-03-29 NOTE — Therapy (Signed)
[facility] [address]  [place], [place], [address] Phone: [phone]   Fax:  [phone]  Physical Therapy Treatment  Patient Details  Name: Kimberly Boyd MRN: [id number] Date of Birth: Sep 17, 1965 Referring Provider (PT): Melrose Nakayama MD   Encounter Date: 03/29/2021   PT End of Session - 03/29/21 1143     Visit Number 13    Number of Visits 18    Date for PT Re-Evaluation 04/08/21    Authorization Type [facility] VL 25    PT Start Time 1017    PT Stop Time 1110    PT Time Calculation (min) 53 min    Activity Tolerance Patient tolerated treatment well    Behavior During Therapy WFL for tasks assessed/performed             Past Medical History:  Diagnosis Date   Anemia    Anxiety    Arthritis    Arthritis of left foot    Arthritis of right knee    Back pain years.    chronic low back pain    Chronic back pain    Complication of anesthesia    tachycardia, difficulty waking up   GERD (gastroesophageal reflux disease)    Heart murmur    questionable   Hypertension    Obesity    Palpitations    Pre-diabetes    SVT (supraventricular tachycardia) (HCC)     Past Surgical History:  Procedure Laterality Date   BREAST BIOPSY Right    Benign approx age 32   cervical dysplasia laser     dermoid cyst removal     IR GENERIC HISTORICAL  06/04/2014   IR RADIOLOGIST EVAL & MGMT 06/04/2014 Sandi Mariscal, MD [facility] INTERV RAD   LAPAROSCOPIC CHOLECYSTECTOMY     LIPOMA RESECTION     and redone   ORIF ANKLE FRACTURE  08/09/2011   Procedure: OPEN REDUCTION INTERNAL FIXATION (ORIF) ANKLE FRACTURE;  Surgeon: Wylene Simmer, MD;  Location: [facility];  Service: Orthopedics;  Laterality: Right;  ORIF Right Ankle Lateral Malleolus   scar revision for the lipoma     TOTAL KNEE ARTHROPLASTY Right 02/23/2021   Procedure: RIGHT TOTAL KNEE ARTHROPLASTY;  Surgeon: Melrose Nakayama, MD;  [facility];  Service: Orthopedics;   Laterality: Right;   WISDOM TOOTH EXTRACTION      There were no vitals filed for this visit.   Subjective Assessment - 03/29/21 1021     Subjective Pt reports just the usual discomfort in her knee.    Pertinent History PMH - pre-diabetes, obesity, palpitations (SVT)    Diagnostic tests X-ray    Patient Stated Goals walk completely normal, return to exercise    Currently in Pain? Yes    Pain Score 2     Pain Location Knee    Pain Orientation Right    Pain Descriptors / Indicators Tightness    Pain Type Surgical pain                [facility] PT Assessment - 03/29/21 0001       AROM   Right Knee Extension 3                           [facility] Adult PT Treatment/Exercise - 03/29/21 0001       Knee/Hip Exercises: Stretches   Active Hamstring Stretch Right;30 seconds    Active Hamstring Stretch Limitations seated with leg  supported on mat with strap      Knee/Hip Exercises: Aerobic   Recumbent Bike L3x55mn      Knee/Hip Exercises: Standing   Hip Abduction Stengthening;Both;10 reps;Knee straight    Abduction Limitations 2# cuff weight    Hip Extension Stengthening;Both;10 reps;Knee straight    Extension Limitations 2# cuff weight    Step Down Right;15 reps;Hand Hold: 1;Step Height: 4"    Step Down Limitations less compensation seen with 4' step    SLS R SL RDL 10x with 5lb weight    Other Standing Knee Exercises R hip hikes with standing on step 15 reps      Vasopneumatic   Number Minutes Vasopneumatic  10 minutes    Vasopnuematic Location  Knee    Vasopneumatic Pressure Low    Vasopneumatic Temperature  34                       PT Short Term Goals - 03/12/21 01610      PT SHORT TERM GOAL #1   Title Patient to be independent with initial HEP.    Time 2    Period Weeks    Status Achieved   03/12/21   Target Date 03/11/21               PT Long Term Goals - 03/16/21 1203       PT LONG TERM GOAL #1   Title Patient to be  independent with advanced HEP.    Time 6    Period Weeks    Status On-going   03/16/2021- met for current, very compliant.   Target Date 04/08/21      PT LONG TERM GOAL #2   Title Pt. will demonstrate 0-120 deg R knee ROM for safety with gait and stairs.    Baseline 15-78    Time 6    Period Weeks    Status On-going   12/19- 100 deg R knee flexion   Target Date 04/08/21      PT LONG TERM GOAL #3   Title Pt. will demonstrate 5/5 RLE strength for safety with gait.    Baseline significant R quad lag    Time 6    Period Weeks    Status On-going    Target Date 04/08/21      PT LONG TERM GOAL #4   Title Patient will be able to ambulate 900' without AD and with good safety for community ambulation.    Baseline slow antalgic gait with 2WRW    Time 6    Period Weeks    Status On-going   [facility]  Target Date 04/08/21      PT LONG TERM GOAL #5   Title Pt. will be able to ascend/descend 1 flight of stairs with 1 HR and good safety using reciprocal step to access home.    Baseline unable    Time 6    Period Weeks    Status On-going   03/16/20- step to gait up and down stairs with 1 HR   Target Date 04/08/21                   Plan - 03/29/21 1154     Clinical Impression Statement Pt is making steady progress improving knee extension. We did focus more on R LE strengthening with SL exercises to incorporate more glute and quad activation.  Cues to correct compensation with eccentric step downs and RDLs. Pt  responded well to the treatment today, showed increased stride length after the exercises today w/o an AD. Extension measured 3 deg post session.    Personal Factors and Comorbidities Comorbidity 2    Comorbidities HTN, history SVT, pre-diabetes    PT Frequency --   2-3x per week   PT Duration 6 weeks    PT Treatment/Interventions ADLs/Self Care Home Management;Cryotherapy;Electrical Stimulation;Gait training;Stair training;Functional mobility training;Therapeutic  activities;Therapeutic exercise;Balance training;Neuromuscular re-education;Patient/family education;Manual techniques;Scar mobilization;Passive range of motion;Dry needling;Taping;Vasopneumatic Device;Joint Manipulations    PT Next Visit Plan work on improving extension, WB quad/glute activation, modalities PRN    PT Home Exercise Plan Access Code: [email or web address]    Consulted and Agree with Plan of Care Patient             Patient will benefit from skilled therapeutic intervention in order to improve the following deficits and impairments:  Abnormal gait, Decreased endurance, Decreased skin integrity, Difficulty walking, Increased muscle spasms, Decreased range of motion, Decreased scar mobility, Increased edema, Decreased activity tolerance, Decreased strength, Increased fascial restricitons, Impaired flexibility, Pain, Decreased balance, Decreased mobility  Visit Diagnosis: Acute pain of right knee  Stiffness of right knee, not elsewhere classified  Localized edema  Muscle weakness (generalized)  Difficulty in walking, not elsewhere classified     Problem List Patient Active Problem List   Diagnosis Date Noted   Primary osteoarthritis of right knee 02/23/2021   Other hyperlipidemia 12/22/2020   Prediabetes 05/13/2020   Arthritis of right knee 05/13/2020   Asymptomatic varicose veins of bilateral lower extremities 05/13/2020   Dysplastic nevus 05/13/2020   Elevated erythrocyte sedimentation rate 05/13/2020   Gout 05/13/2020   Gouty arthropathy 05/13/2020   Hot flashes 05/13/2020   Hyperglycemia 05/13/2020   Insomnia 05/13/2020   Menopause 05/13/2020   Iron deficiency anemia due to chronic blood loss 05/13/2020   Pica 05/13/2020   Pure hypercholesterolemia 05/13/2020   Right knee pain 05/13/2020   Sacroiliitis, not elsewhere classified ([place]) 05/13/2020   Vitamin D insufficiency 05/13/2020   Class 1 obesity with serious comorbidity and body mass index (BMI) of 33.0 to 33.9  in adult    Essential hypertension 08/03/2017   Osteoarthritis of right knee 05/20/2015   Fibroid, uterine    Fever 09/30/2010   Supraventricular tachycardia-probable concealed accessory pathway 08/31/2010   Stress 08/31/2010   ALLERGIC RHINITIS 11/20/2008   GERD 11/20/2008   PALPITATIONS 11/20/2008   CHEST PAIN 11/20/2008    Artist Pais, PTA 03/29/2021, 12:01 PM  [facility] [address]  [place], [place], [address] Phone: [phone]   Fax:  [phone]  Name: Kimberly Boyd MRN: [id number] Date of Birth: 11/21/1965

## 2021-04-01 ENCOUNTER — Other Ambulatory Visit: Payer: Self-pay

## 2021-04-01 ENCOUNTER — Ambulatory Visit: Payer: 59

## 2021-04-01 DIAGNOSIS — R262 Difficulty in walking, not elsewhere classified: Secondary | ICD-10-CM

## 2021-04-01 DIAGNOSIS — M6281 Muscle weakness (generalized): Secondary | ICD-10-CM

## 2021-04-01 DIAGNOSIS — R600 Localized edema: Secondary | ICD-10-CM | POA: Diagnosis not present

## 2021-04-01 DIAGNOSIS — M25561 Pain in right knee: Secondary | ICD-10-CM | POA: Diagnosis not present

## 2021-04-01 DIAGNOSIS — M25661 Stiffness of right knee, not elsewhere classified: Secondary | ICD-10-CM | POA: Diagnosis not present

## 2021-04-01 NOTE — Therapy (Signed)
[facility] [address]  [place], [place], [address] Phone: [phone]   Fax:  [phone]  Physical Therapy Treatment  Patient Details  Name: Kimberly Boyd MRN: [id number] Date of Birth: 1966/03/10 Referring Provider (PT): Melrose Nakayama MD   Encounter Date: 04/01/2021   PT End of Session - 04/01/21 1103     Visit Number 14    Number of Visits 18    Date for PT Re-Evaluation 04/08/21    Authorization Type [facility] VL 25    PT Start Time 1019    PT Stop Time 1111    PT Time Calculation (min) 52 min    Activity Tolerance Patient tolerated treatment well    Behavior During Therapy WFL for tasks assessed/performed             Past Medical History:  Diagnosis Date   Anemia    Anxiety    Arthritis    Arthritis of left foot    Arthritis of right knee    Back pain years.    chronic low back pain    Chronic back pain    Complication of anesthesia    tachycardia, difficulty waking up   GERD (gastroesophageal reflux disease)    Heart murmur    questionable   Hypertension    Obesity    Palpitations    Pre-diabetes    SVT (supraventricular tachycardia) (HCC)     Past Surgical History:  Procedure Laterality Date   BREAST BIOPSY Right    Benign approx age 28   cervical dysplasia laser     dermoid cyst removal     IR GENERIC HISTORICAL  06/04/2014   IR RADIOLOGIST EVAL & MGMT 06/04/2014 Sandi Mariscal, MD [facility] INTERV RAD   LAPAROSCOPIC CHOLECYSTECTOMY     LIPOMA RESECTION     and redone   ORIF ANKLE FRACTURE  08/09/2011   Procedure: OPEN REDUCTION INTERNAL FIXATION (ORIF) ANKLE FRACTURE;  Surgeon: Wylene Simmer, MD;  Location: [facility];  Service: Orthopedics;  Laterality: Right;  ORIF Right Ankle Lateral Malleolus   scar revision for the lipoma     TOTAL KNEE ARTHROPLASTY Right 02/23/2021   Procedure: RIGHT TOTAL KNEE ARTHROPLASTY;  Surgeon: Melrose Nakayama, MD;  [facility];  Service: Orthopedics;   Laterality: Right;   WISDOM TOOTH EXTRACTION      There were no vitals filed for this visit.   Subjective Assessment - 04/01/21 1021     Subjective Pt reports that she has been practicing the steps at home and wants to get better with going down.    Pertinent History PMH - pre-diabetes, obesity, palpitations (SVT)    Diagnostic tests X-ray    Patient Stated Goals walk completely normal, return to exercise    Currently in Pain? No/denies                [facility] PT Assessment - 04/01/21 0001       AROM   Right Knee Extension 3    Right Knee Flexion 109                           [facility] Adult PT Treatment/Exercise - 04/01/21 0001       Ambulation/Gait   Stairs Yes    Stairs Assistance 6: Modified independent (Device/Increase time)    Stair Management Technique One rail Right    Number of Stairs 12    Height of Stairs  8      Knee/Hip Exercises: Aerobic   Recumbent Bike L3x42mn      Knee/Hip Exercises: Machines for Strengthening   Cybex Leg Press 10lb, 10 reps R/L LE, 15lb 5 reps      Knee/Hip Exercises: Standing   Lateral Step Up Right;10 reps;Hand Hold: 1;Step Height: 6"    Lateral Step Up Limitations focus on eccentric lowering    Forward Step Up Right;10 reps;Hand Hold: 1;Step Height: 6"    Forward Step Up Limitations focus on eccentric lowering    Functional Squat 10 reps    Functional Squat Limitations single leg squats with UE support    Other Standing Knee Exercises R SLS with L hip abd+ext 15 reps      Modalities   Modalities Vasopneumatic      Vasopneumatic   Number Minutes Vasopneumatic  10 minutes    Vasopnuematic Location  Knee    Vasopneumatic Pressure Low    Vasopneumatic Temperature  34                       PT Short Term Goals - 03/12/21 06283      PT SHORT TERM GOAL #1   Title Patient to be independent with initial HEP.    Time 2    Period Weeks    Status Achieved   03/12/21   Target Date 03/11/21                PT Long Term Goals - 04/01/21 1138       PT LONG TERM GOAL #1   Title Patient to be independent with advanced HEP.    Time 6    Period Weeks    Status On-going   03/16/2021- met for current, very compliant.   Target Date 04/08/21      PT LONG TERM GOAL #2   Title Pt. will demonstrate 0-120 deg R knee ROM for safety with gait and stairs.    Baseline 15-78    Time 6    Period Weeks    Status On-going   1/19- 3-109 deg   Target Date 04/08/21      PT LONG TERM GOAL #3   Title Pt. will demonstrate 5/5 RLE strength for safety with gait.    Baseline significant R quad lag    Time 6    Period Weeks    Status On-going    Target Date 04/08/21      PT LONG TERM GOAL #4   Title Patient will be able to ambulate 900' without AD and with good safety for community ambulation.    Baseline slow antalgic gait with 2WRW    Time 6    Period Weeks    Status On-going   [facility]  Target Date 04/08/21      PT LONG TERM GOAL #5   Title Pt. will be able to ascend/descend 1 flight of stairs with 1 HR and good safety using reciprocal step to access home.    Baseline unable    Time 6    Period Weeks    Status On-going   03/16/20- step to gait up and down stairs with 1 HR   Target Date 04/08/21                   Plan - 04/01/21 1139     Clinical Impression Statement Pt shows better control with step downs today but at times needed cues to  avoid hopping. Focused mainly on strengthening to improve stair navigation and eccentric control descending. Knee ROM is improving but still could use more work on getting the last few degrees of ROM. Deficits mostly remain with quad weakness descending stairs.    Personal Factors and Comorbidities Comorbidity 2    Comorbidities HTN, history SVT, pre-diabetes    PT Frequency --   2-3x per week   PT Duration 6 weeks    PT Treatment/Interventions ADLs/Self Care Home Management;Cryotherapy;Electrical Stimulation;Gait training;Stair  training;Functional mobility training;Therapeutic activities;Therapeutic exercise;Balance training;Neuromuscular re-education;Patient/family education;Manual techniques;Scar mobilization;Passive range of motion;Dry needling;Taping;Vasopneumatic Device;Joint Manipulations    PT Next Visit Plan work on improving extension, WB quad/glute activation, modalities PRN    PT Home Exercise Plan Access Code: [email or web address]    Consulted and Agree with Plan of Care Patient             Patient will benefit from skilled therapeutic intervention in order to improve the following deficits and impairments:  Abnormal gait, Decreased endurance, Decreased skin integrity, Difficulty walking, Increased muscle spasms, Decreased range of motion, Decreased scar mobility, Increased edema, Decreased activity tolerance, Decreased strength, Increased fascial restricitons, Impaired flexibility, Pain, Decreased balance, Decreased mobility  Visit Diagnosis: Acute pain of right knee  Stiffness of right knee, not elsewhere classified  Localized edema  Muscle weakness (generalized)  Difficulty in walking, not elsewhere classified     Problem List Patient Active Problem List   Diagnosis Date Noted   Primary osteoarthritis of right knee 02/23/2021   Other hyperlipidemia 12/22/2020   Prediabetes 05/13/2020   Arthritis of right knee 05/13/2020   Asymptomatic varicose veins of bilateral lower extremities 05/13/2020   Dysplastic nevus 05/13/2020   Elevated erythrocyte sedimentation rate 05/13/2020   Gout 05/13/2020   Gouty arthropathy 05/13/2020   Hot flashes 05/13/2020   Hyperglycemia 05/13/2020   Insomnia 05/13/2020   Menopause 05/13/2020   Iron deficiency anemia due to chronic blood loss 05/13/2020   Pica 05/13/2020   Pure hypercholesterolemia 05/13/2020   Right knee pain 05/13/2020   Sacroiliitis, not elsewhere classified ([place]) 05/13/2020   Vitamin D insufficiency 05/13/2020   Class 1 obesity with serious  comorbidity and body mass index (BMI) of 33.0 to 33.9 in adult    Essential hypertension 08/03/2017   Osteoarthritis of right knee 05/20/2015   Fibroid, uterine    Fever 09/30/2010   Supraventricular tachycardia-probable concealed accessory pathway 08/31/2010   Stress 08/31/2010   ALLERGIC RHINITIS 11/20/2008   GERD 11/20/2008   PALPITATIONS 11/20/2008   CHEST PAIN 11/20/2008    Artist Pais, PTA 04/01/2021, 12:02 PM  [facility] [address]  [place], [place], [address] Phone: [phone]   Fax:  [phone]  Name: EMIAH PELLICANO MRN: [id number] Date of Birth: 1965/12/07

## 2021-04-03 NOTE — Progress Notes (Signed)
Cardiology Office Note Date:  04/05/2021  Patient ID:  Kimberly Boyd, Kimberly Boyd [birth year], MRN [id number] PCP:  Vernie Shanks, MD  Electrophysiologist: Dr. Caryl Comes     Chief Complaint:  post hospital  History of Present Illness: Kimberly Boyd is a 56 y.o. female with history of SVT (described as adenosine sensitive), HTN, GERD, arthritis  She comes in today to be seen for Dr. Caryl Comes, last seen by him 01/05/21, she was having palpitations, [facility] tracings noted PACs, PVCs, discussed benign nature of these and no changes made, continued on Inderal LA, discussed significant stress.  Nov 2022, pt sent message that she had an episode of palpitations lasting 20-53min associated with lightheadedness, called EMS, by her report no abnormality was found and did not go to the ER, tracing she made were reviewed noting SR with PACs and a couple PVCs only.  ER visit 02/20/21 with escalating irregular heart beat, completed antibiotic course for a respiratory infection weeks prior Observed on telemetry to have intermittently frequent PACs. She was hypokalemia and repleted, instructed to follow up with her PMD, labs  02/23/21, had knee surgery, R TKR  TODAY She is doing great with her knee surgery recovery. When she has her palpitations that are very bothersome, and anxiety provoking.  They can be vry uncomfortable and when bad last all day. No clear trigger, but when they happen so bad are usual in the evenings. Outside of the palpitations, she feels quite well.  Once she called EMS< when they got there HR had settled In the ER  when she went they were constant and noted very frequent ectopy on her monitor the whole time she was there. She called in one evening and spoke to the On call person who recommended that she increase her propanolol and since then then seems to have improved.  No associated CP, SOB, no near syncope or syncope  She was worried about her K+ getting high so has been taking  it QOD.  Past Medical History:  Diagnosis Date   Anemia    Anxiety    Arthritis    Arthritis of left foot    Arthritis of right knee    Back pain years.    chronic low back pain    Chronic back pain    Complication of anesthesia    tachycardia, difficulty waking up   GERD (gastroesophageal reflux disease)    Heart murmur    questionable   Hypertension    Obesity    Palpitations    Pre-diabetes    SVT (supraventricular tachycardia) (HCC)     Past Surgical History:  Procedure Laterality Date   BREAST BIOPSY Right    Benign approx age 58   cervical dysplasia laser     dermoid cyst removal     IR GENERIC HISTORICAL  06/04/2014   IR RADIOLOGIST EVAL & MGMT 06/04/2014 Sandi Mariscal, MD [facility] INTERV RAD   LAPAROSCOPIC CHOLECYSTECTOMY     LIPOMA RESECTION     and redone   ORIF ANKLE FRACTURE  08/09/2011   Procedure: OPEN REDUCTION INTERNAL FIXATION (ORIF) ANKLE FRACTURE;  Surgeon: Wylene Simmer, MD;  Location: [facility];  Service: Orthopedics;  Laterality: Right;  ORIF Right Ankle Lateral Malleolus   scar revision for the lipoma     TOTAL KNEE ARTHROPLASTY Right 02/23/2021   Procedure: RIGHT TOTAL KNEE ARTHROPLASTY;  Surgeon: Melrose Nakayama, MD;  Location: [facility];  Service: Orthopedics;  Laterality: Right;   WISDOM TOOTH EXTRACTION  Current Outpatient Medications  Medication Sig Dispense Refill   ALPRAZolam (XANAX) 0.5 MG tablet Take 0.5 mg by mouth daily as needed for anxiety.     aspirin EC 81 MG tablet Take 1 tablet by mouth 2 times daily after a meal. Swallow whole. 45 tablet 0   Biotin 2500 MCG CAPS Take 2,500 mcg by mouth daily. Gummy     Cholecalciferol (VITAMIN D3) 50 MCG (2000 UT) TABS Take 4,000 Units by mouth in the morning.     Cholecalciferol 100 MCG (4000 UT) CAPS Take 1 capsule (4,000 Units total) by mouth daily. 30 capsule    ELDERBERRY PO Take 1 tablet by mouth 3 (three) times a week.     Magnesium 250 MG TABS Take 250 mg by mouth 3 (three) times a week.      omeprazole (PRILOSEC) 20 MG capsule Take 1 capsule by mouth once a day 90 capsule 0   potassium chloride (KLOR-CON M) 10 MEQ tablet Take 1 tablet (10 mEq total) by mouth daily. 30 tablet 1   Probiotic Product (PROBIOTIC PO) Take 1 capsule by mouth daily.     propranolol ER (INDERAL LA) 120 MG 24 hr capsule Take 1 capsule (120 mg total) by mouth daily. 30 capsule 5   tiZANidine (ZANAFLEX) 4 MG tablet Take 1 tablet by mouth every 6 hours as needed for muscle spasms. 40 tablet 1   triamterene-hydrochlorothiazide (MAXZIDE-25) 37.5-25 MG tablet Take 1 tablet by mouth in the morning daily 90 tablet 1   vitamin C (ASCORBIC ACID) 500 MG tablet Take 1,000 mg by mouth daily.     zinc sulfate 220 (50 Zn) MG capsule Take 220 mg by mouth 3 (three) times a week.     No current facility-administered medications for this visit.    Allergies:   Doxycycline   Social History:  The patient  reports that she has never smoked. She has never used smokeless tobacco. She reports current alcohol use. She reports that she does not use drugs.   Family History:  The patient's family history includes Cancer in her father and mother; High Cholesterol in her father; High blood pressure in her father and mother; Kidney disease in her father; Stroke in her father.  ROS:  Please see the history of present illness.    All other systems are reviewed and otherwise negative.   PHYSICAL EXAM:  VS:  BP 108/62    Pulse 68    Ht 5\' 7"  (1.702 m)    Wt 199 lb (90.3 kg)    SpO2 96%    BMI 31.17 kg/m  BMI: Body mass index is 31.17 kg/m. Well nourished, well developed, in no acute distress HEENT: normocephalic, atraumatic Neck: no JVD, carotid bruits or masses Cardiac:  RRR; no significant murmurs, no rubs, or gallops Lungs:  CTA b/l, no wheezing, rhonchi or rales Abd: soft, nontender MS: no deformity or atrophy Ext: no edema Skin: warm and dry, no rash Neuro:  No gross deficits appreciated Psych: euthymic mood, full  affect   EKG:  not done today ER reviewed 02/20/21 SR, sinus arrhythmia, PACs, 97bpm   Recent Labs: 02/20/2021: ALT 24; BUN 19; Creatinine, Ser 0.55; Hemoglobin 12.5; Magnesium 2.0; Platelets 315; Potassium 3.0; Sodium 139; TSH 2.300  11/24/2020: Cholesterol, Total 180; HDL 59; LDL Chol Calc (NIH) 110; Triglycerides 57   CrCl cannot be calculated (Patient's most recent lab result is older than the maximum 21 days allowed.).   Wt Readings from Last 3 Encounters:  04/05/21 199 lb (90.3 kg)  02/23/21 204 lb (92.5 kg)  02/20/21 204 lb (92.5 kg)     Other studies reviewed: Additional studies/records reviewed today include: summarized above  ASSESSMENT AND PLAN:  Palpitations Svt PACs, some PVCs Continue propanolol, she is doing a bit better with it at this dose. Follow up on her electrolytes Get an echo  Disposition: F/u with [place] in 6-8 weeks, sooner if needed  Current medicines are reviewed at length with the patient today.  The patient did not have any concerns regarding medicines.  Venetia Night, PA-C 04/05/2021 5:14 PM     [name]  [address] [phone] (office)  [phone] (fax)

## 2021-04-05 ENCOUNTER — Encounter: Payer: Self-pay | Admitting: Physician Assistant

## 2021-04-05 ENCOUNTER — Ambulatory Visit: Payer: 59

## 2021-04-05 ENCOUNTER — Ambulatory Visit: Payer: 59 | Admitting: Physician Assistant

## 2021-04-05 ENCOUNTER — Other Ambulatory Visit: Payer: Self-pay

## 2021-04-05 VITALS — BP 108/62 | HR 68 | Ht 67.0 in | Wt 199.0 lb

## 2021-04-05 DIAGNOSIS — M25661 Stiffness of right knee, not elsewhere classified: Secondary | ICD-10-CM

## 2021-04-05 DIAGNOSIS — I471 Supraventricular tachycardia: Secondary | ICD-10-CM

## 2021-04-05 DIAGNOSIS — R600 Localized edema: Secondary | ICD-10-CM | POA: Diagnosis not present

## 2021-04-05 DIAGNOSIS — R262 Difficulty in walking, not elsewhere classified: Secondary | ICD-10-CM | POA: Diagnosis not present

## 2021-04-05 DIAGNOSIS — R002 Palpitations: Secondary | ICD-10-CM | POA: Diagnosis not present

## 2021-04-05 DIAGNOSIS — I491 Atrial premature depolarization: Secondary | ICD-10-CM

## 2021-04-05 DIAGNOSIS — M6281 Muscle weakness (generalized): Secondary | ICD-10-CM

## 2021-04-05 DIAGNOSIS — I493 Ventricular premature depolarization: Secondary | ICD-10-CM | POA: Diagnosis not present

## 2021-04-05 DIAGNOSIS — M25561 Pain in right knee: Secondary | ICD-10-CM

## 2021-04-05 NOTE — Therapy (Signed)
[facility] [address]  [place], [place], [address] Phone: [phone]   Fax:  [phone]  Physical Therapy Treatment  Patient Details  Name: Kimberly Boyd MRN: [id number] Date of Birth: 01-06-1966 Referring Provider (PT): Melrose Nakayama MD   Encounter Date: 04/05/2021   PT End of Session - 04/05/21 1105     Visit Number 15    Number of Visits 18    Date for PT Re-Evaluation 04/08/21    Authorization Type [facility] VL 25    PT Start Time 1017    PT Stop Time 1113    PT Time Calculation (min) 56 min    Activity Tolerance Patient tolerated treatment well    Behavior During Therapy WFL for tasks assessed/performed             Past Medical History:  Diagnosis Date   Anemia    Anxiety    Arthritis    Arthritis of left foot    Arthritis of right knee    Back pain years.    chronic low back pain    Chronic back pain    Complication of anesthesia    tachycardia, difficulty waking up   GERD (gastroesophageal reflux disease)    Heart murmur    questionable   Hypertension    Obesity    Palpitations    Pre-diabetes    SVT (supraventricular tachycardia) (HCC)     Past Surgical History:  Procedure Laterality Date   BREAST BIOPSY Right    Benign approx age 24   cervical dysplasia laser     dermoid cyst removal     IR GENERIC HISTORICAL  06/04/2014   IR RADIOLOGIST EVAL & MGMT 06/04/2014 Sandi Mariscal, MD [facility] INTERV RAD   LAPAROSCOPIC CHOLECYSTECTOMY     LIPOMA RESECTION     and redone   ORIF ANKLE FRACTURE  08/09/2011   Procedure: OPEN REDUCTION INTERNAL FIXATION (ORIF) ANKLE FRACTURE;  Surgeon: Wylene Simmer, MD;  Location: [facility];  Service: Orthopedics;  Laterality: Right;  ORIF Right Ankle Lateral Malleolus   scar revision for the lipoma     TOTAL KNEE ARTHROPLASTY Right 02/23/2021   Procedure: RIGHT TOTAL KNEE ARTHROPLASTY;  Surgeon: Melrose Nakayama, MD;  [facility];  Service: Orthopedics;   Laterality: Right;   WISDOM TOOTH EXTRACTION      There were no vitals filed for this visit.   Subjective Assessment - 04/05/21 1019     Subjective Pt reports that her knee has been doing good, still practicing stairs, feeling some more pain on the front of her thigh and calf.    Pertinent History PMH - pre-diabetes, obesity, palpitations (SVT)    Diagnostic tests X-ray    Patient Stated Goals walk completely normal, return to exercise    Currently in Pain? Yes    Pain Score 3     Pain Location Knee    Pain Orientation Right    Pain Descriptors / Indicators Tightness    Pain Type Surgical pain                               [facility] Adult PT Treatment/Exercise - 04/05/21 0001       Ambulation/Gait   Ambulation/Gait Yes    Ambulation Distance (Feet) 240 Feet    Assistive device None    Gait Pattern Step-through pattern;Decreased step length - left;Decreased stance time - right  Knee/Hip Exercises: Stretches   Programmer, applications reps;30 seconds    Gastroc Stretch Limitations runner stretch      Knee/Hip Exercises: Aerobic   Recumbent Bike L3x81mn      Manual Therapy   Manual Therapy Soft tissue mobilization    Soft tissue mobilization STM/IASTM to R quads, gastroc; pin and stretch to [employer]                      PT Short Term Goals - 03/12/21 0808       PT SHORT TERM GOAL #1   Title Patient to be independent with initial HEP.    Time 2    Period Weeks    Status Achieved   03/12/21   Target Date 03/11/21               PT Long Term Goals - 04/01/21 1138       PT LONG TERM GOAL #1   Title Patient to be independent with advanced HEP.    Time 6    Period Weeks    Status On-going   03/16/2021- met for current, very compliant.   Target Date 04/08/21      PT LONG TERM GOAL #2   Title Pt. will demonstrate 0-120 deg R knee ROM for safety with gait and stairs.    Baseline 15-78    Time 6    Period Weeks    Status  On-going   1/19- 3-109 deg   Target Date 04/08/21      PT LONG TERM GOAL #3   Title Pt. will demonstrate 5/5 RLE strength for safety with gait.    Baseline significant R quad lag    Time 6    Period Weeks    Status On-going    Target Date 04/08/21      PT LONG TERM GOAL #4   Title Patient will be able to ambulate 900' without AD and with good safety for community ambulation.    Baseline slow antalgic gait with 2WRW    Time 6    Period Weeks    Status On-going   [facility]  Target Date 04/08/21      PT LONG TERM GOAL #5   Title Pt. will be able to ascend/descend 1 flight of stairs with 1 HR and good safety using reciprocal step to access home.    Baseline unable    Time 6    Period Weeks    Status On-going   03/16/20- step to gait up and down stairs with 1 HR   Target Date 04/08/21                   Plan - 04/05/21 1112     Clinical Impression Statement Focused most of session on STM to the R quads and gastroc due to complaints of increased soreness in these areas. Afterwards we went walking with no AD, she is showing more confident gait and smoother strides. She reported that she wants to continue to working with PT so anticipate recert next visit.    Personal Factors and Comorbidities Comorbidity 2    Comorbidities HTN, history SVT, pre-diabetes    PT Frequency --   2-3x per week   PT Duration 6 weeks    PT Treatment/Interventions ADLs/Self Care Home Management;Cryotherapy;Electrical Stimulation;Gait training;Stair training;Functional mobility training;Therapeutic activities;Therapeutic exercise;Balance training;Neuromuscular re-education;Patient/family education;Manual techniques;Scar mobilization;Passive range of motion;Dry needling;Taping;Vasopneumatic Device;Joint Manipulations    PT Next Visit  Plan recert; work on improving extension, WB quad/glute activation, modalities PRN    PT Home Exercise Plan Access Code: [email or web address]    Consulted and Agree with Plan  of Care Patient             Patient will benefit from skilled therapeutic intervention in order to improve the following deficits and impairments:  Abnormal gait, Decreased endurance, Decreased skin integrity, Difficulty walking, Increased muscle spasms, Decreased range of motion, Decreased scar mobility, Increased edema, Decreased activity tolerance, Decreased strength, Increased fascial restricitons, Impaired flexibility, Pain, Decreased balance, Decreased mobility  Visit Diagnosis: Acute pain of right knee  Stiffness of right knee, not elsewhere classified  Localized edema  Muscle weakness (generalized)  Difficulty in walking, not elsewhere classified     Problem List Patient Active Problem List   Diagnosis Date Noted   Primary osteoarthritis of right knee 02/23/2021   Other hyperlipidemia 12/22/2020   Prediabetes 05/13/2020   Arthritis of right knee 05/13/2020   Asymptomatic varicose veins of bilateral lower extremities 05/13/2020   Dysplastic nevus 05/13/2020   Elevated erythrocyte sedimentation rate 05/13/2020   Gout 05/13/2020   Gouty arthropathy 05/13/2020   Hot flashes 05/13/2020   Hyperglycemia 05/13/2020   Insomnia 05/13/2020   Menopause 05/13/2020   Iron deficiency anemia due to chronic blood loss 05/13/2020   Pica 05/13/2020   Pure hypercholesterolemia 05/13/2020   Right knee pain 05/13/2020   Sacroiliitis, not elsewhere classified ([place]) 05/13/2020   Vitamin D insufficiency 05/13/2020   Class 1 obesity with serious comorbidity and body mass index (BMI) of 33.0 to 33.9 in adult    Essential hypertension 08/03/2017   Osteoarthritis of right knee 05/20/2015   Fibroid, uterine    Fever 09/30/2010   Supraventricular tachycardia-probable concealed accessory pathway 08/31/2010   Stress 08/31/2010   ALLERGIC RHINITIS 11/20/2008   GERD 11/20/2008   PALPITATIONS 11/20/2008   CHEST PAIN 11/20/2008    Artist Pais, PTA 04/05/2021, 12:10 PM  [facility] [address]  [place], [place], [address] Phone: [phone]   Fax:  [phone]  Name: Kimberly Boyd MRN: [id number] Date of Birth: [birth year]

## 2021-04-05 NOTE — Patient Instructions (Signed)
Medication Instructions:  Your physician recommends that you continue on your current medications as directed. Please refer to the Current Medication list given to you today.  *If you need a refill on your cardiac medications before your next appointment, please call your pharmacy*   Lab Work: Today:  bmet  If you have labs (blood work) drawn today and your tests are completely normal, you will receive your results only by: [place] (if you have MyChart) OR A paper copy in the mail If you have any lab test that is abnormal or we need to change your treatment, we will call you to review the results.   Testing/Procedures: Your physician has requested that you have an echocardiogram. Echocardiography is a painless test that uses sound waves to create images of your heart. It provides your doctor with information about the size and shape of your heart and how well your hearts chambers and valves are working. This procedure takes approximately one hour. There are no restrictions for this procedure.    Follow-Up: At [facility], you and your health needs are our priority.  As part of our continuing mission to provide you with exceptional heart care, we have created designated Provider Care Teams.  These Care Teams include your primary Cardiologist (physician) and Advanced Practice Providers (APPs -  Physician Assistants and Nurse Practitioners) who all work together to provide you with the care you need, when you need it.  We recommend signing up for the patient portal called "MyChart".  Sign up information is provided on this After Visit Summary.  MyChart is used to connect with patients for Virtual Visits (Telemedicine).  Patients are able to view lab/test results, encounter notes, upcoming appointments, etc.  Non-urgent messages can be sent to your provider as well.   To learn more about what you can do with MyChart, go to [email or web address].    Your next appointment:   6-8  week(s)  The format for your next appointment:   In Person  Provider:   You will see one of the following Advanced Practice Providers on your designated Care Team:   Tommye Standard, [place] Legrand Como "Jonni Sanger" Chalmers Cater, [place]      Other Instructions

## 2021-04-06 ENCOUNTER — Other Ambulatory Visit (HOSPITAL_COMMUNITY): Payer: Self-pay

## 2021-04-06 LAB — BASIC METABOLIC PANEL
BUN/Creatinine Ratio: 23 (ref 9–23)
BUN: 17 mg/dL (ref 6–24)
CO2: 28 mmol/L (ref 20–29)
Calcium: 10.1 mg/dL (ref 8.7–10.2)
Chloride: 103 mmol/L (ref 96–106)
Creatinine, Ser: 0.74 mg/dL (ref 0.57–1.00)
Glucose: 104 mg/dL — ABNORMAL HIGH (ref 70–99)
Potassium: 4 mmol/L (ref 3.5–5.2)
Sodium: 144 mmol/L (ref 134–144)
eGFR: 95 mL/min/{1.73_m2} (ref >59)

## 2021-04-06 MED ORDER — TRAMADOL HCL 50 MG PO TABS
ORAL_TABLET | ORAL | 0 refills | Status: DC
  Filled 2021-04-06: qty 30, 10d supply, fill #0

## 2021-04-07 ENCOUNTER — Other Ambulatory Visit: Payer: Self-pay | Admitting: *Deleted

## 2021-04-07 MED ORDER — POTASSIUM CHLORIDE CRYS ER 10 MEQ PO TBCR: 10.0000 meq | EXTENDED_RELEASE_TABLET | ORAL | 1 refills | Status: DC

## 2021-04-08 ENCOUNTER — Encounter: Payer: Self-pay | Admitting: Physical Therapy

## 2021-04-08 ENCOUNTER — Ambulatory Visit: Payer: 59 | Admitting: Physical Therapy

## 2021-04-08 ENCOUNTER — Other Ambulatory Visit: Payer: Self-pay

## 2021-04-08 DIAGNOSIS — M25561 Pain in right knee: Secondary | ICD-10-CM

## 2021-04-08 DIAGNOSIS — M6281 Muscle weakness (generalized): Secondary | ICD-10-CM

## 2021-04-08 DIAGNOSIS — R600 Localized edema: Secondary | ICD-10-CM

## 2021-04-08 DIAGNOSIS — R262 Difficulty in walking, not elsewhere classified: Secondary | ICD-10-CM | POA: Diagnosis not present

## 2021-04-08 DIAGNOSIS — M25661 Stiffness of right knee, not elsewhere classified: Secondary | ICD-10-CM | POA: Diagnosis not present

## 2021-04-09 NOTE — Therapy (Signed)
[facility] [address]  [place], [place], [address] Phone: [phone]   Fax:  [phone]  Physical Therapy Treatment Progress Note Reporting Period 02/25/2021 to 04/09/2021  See note below for Objective Data and Assessment of Progress/Goals.     Patient Details  Name: Kimberly Boyd MRN: [id number] Date of Birth: 08-Mar-1966 Referring Provider (PT): Melrose Nakayama MD   Encounter Date: 04/08/2021   PT End of Session - 04/08/21 1010     Visit Number 16    Number of Visits 18    Date for PT Re-Evaluation 04/08/21    Authorization Type [facility] VL 25  - 6 visits in 2022    PT Start Time 1011    PT Stop Time 1110    PT Time Calculation (min) 59 min    Activity Tolerance Patient tolerated treatment well    Behavior During Therapy WFL for tasks assessed/performed             Past Medical History:  Diagnosis Date   Anemia    Anxiety    Arthritis    Arthritis of left foot    Arthritis of right knee    Back pain years.    chronic low back pain    Chronic back pain    Complication of anesthesia    tachycardia, difficulty waking up   GERD (gastroesophageal reflux disease)    Heart murmur    questionable   Hypertension    Obesity    Palpitations    Pre-diabetes    SVT (supraventricular tachycardia) (HCC)     Past Surgical History:  Procedure Laterality Date   BREAST BIOPSY Right    Benign approx age 23   cervical dysplasia laser     dermoid cyst removal     IR GENERIC HISTORICAL  06/04/2014   IR RADIOLOGIST EVAL & MGMT 06/04/2014 Sandi Mariscal, MD [facility] INTERV RAD   LAPAROSCOPIC CHOLECYSTECTOMY     LIPOMA RESECTION     and redone   ORIF ANKLE FRACTURE  08/09/2011   Procedure: OPEN REDUCTION INTERNAL FIXATION (ORIF) ANKLE FRACTURE;  Surgeon: Wylene Simmer, MD;  Location: [place];  Service: Orthopedics;  Laterality: Right;  ORIF Right Ankle Lateral Malleolus   scar revision for the lipoma     TOTAL  KNEE ARTHROPLASTY Right 02/23/2021   Procedure: RIGHT TOTAL KNEE ARTHROPLASTY;  Surgeon: Melrose Nakayama, MD;  Location: [facility];  Service: Orthopedics;  Laterality: Right;   WISDOM TOOTH EXTRACTION      There were no vitals filed for this visit.   Subjective Assessment - 04/08/21 1447     Subjective Pt. reports she is doing well overall.  No pain today in knee, but calf is very tight.  Planning on returning to work week after next.  Feels she would benefit from a little more therapy.    Pertinent History PMH - pre-diabetes, obesity, palpitations (SVT)    Diagnostic tests X-ray    Patient Stated Goals walk completely normal, return to exercise    Currently in Pain? No/denies                [facility] PT Assessment - 04/09/21 0001       Assessment   Medical Diagnosis Z96.659 (ICD-10-CM) - S/P total knee replacement    Referring Provider (PT) Melrose Nakayama MD    Onset Date/Surgical Date 02/23/21    Hand Dominance Left    Next MD Visit February 2023  PROM   Right Knee Extension 3    Right Knee Flexion 125      Ambulation/Gait   Ambulation/Gait Yes    Assistive device None    Gait Pattern Within Functional Limits    Stairs Yes    Stairs Assistance 6: Modified independent (Device/Increase time)    Stair Management Technique One rail Right;Alternating pattern    Number of Stairs 12    Height of Stairs 8    Gait Comments reports these stairs shorter/easier than those in home, poor eccentric control descending.                           [facility] PT Treatment/Exercise - 04/09/21 0001       Knee/Hip Exercises: Stretches   Pension scheme manager reps;30 seconds    Sports administrator Limitations on step    Programmer, applications reps;30 seconds    Press photographer Limitations runner stretch      Knee/Hip Exercises: Aerobic   Recumbent Bike L3x32min      Knee/Hip Exercises: Standing   Other Standing Knee Exercises single leg squats with heel tap x 10 1 UE  support      Modalities   Modalities Vasopneumatic      Vasopneumatic   Number Minutes Vasopneumatic  10 minutes    Vasopnuematic Location  Knee    Vasopneumatic Pressure Low    Vasopneumatic Temperature  34      Manual Therapy   Manual Therapy Soft tissue mobilization;Passive ROM;Other (comment)    Manual therapy comments to improve R knee ROM    Joint Mobilization PA tibial glide with traction and IR in sitting to improve flexion    Soft tissue mobilization STM to bil hamstrings    Passive ROM into knee extension with leg propped up    Other Manual Therapy skilled palpation and monitoring with dry needling              Trigger Point Dry Needling - 04/09/21 0001     Consent Given? Yes    Education Handout Provided Previously provided    Muscles Treated Lower Quadrant Hamstring;Gastrocnemius;Soleus    Dry Needling Comments right side    Hamstring Response Twitch response elicited;Palpable increased muscle length    Gastrocnemius Response Twitch response elicited;Palpable increased muscle length    Soleus Response Twitch response elicited;Palpable increased muscle length                     PT Short Term Goals - 03/12/21 8032       PT SHORT TERM GOAL #1   Title Patient to be independent with initial HEP.    Time 2    Period Weeks    Status Achieved   03/12/21   Target Date 03/11/21               PT Long Term Goals - 04/08/21 1025       PT LONG TERM GOAL #1   Title Patient to be independent with advanced HEP.    Time 6    Period Weeks    Status Achieved   03/16/2021- met for current, very compliant.  04/08/21- met for current   Target Date 04/08/21      PT LONG TERM GOAL #2   Title Pt. will demonstrate 0-120 deg R knee ROM for safety with gait and stairs.    Baseline 15-78    Time 6    Period  Weeks    Status Partially Met   1/19- 3-109 deg  04/08/20- 3-125   Target Date 04/16/21      PT LONG TERM GOAL #3   Title Pt. will demonstrate 5/5  RLE strength for safety with gait.    Baseline significant R quad lag    Time 6    Period Weeks    Status On-going   04/08/20- weakness with R quad eccentric control   Target Date 04/16/21      PT LONG TERM GOAL #4   Title Patient will be able to ambulate 900' without AD and with good safety for community ambulation.    Baseline slow antalgic gait with 2WRW    Time 6    Period Weeks    Status Achieved   03/16/2020- using SPC   Target Date 04/08/21      PT LONG TERM GOAL #5   Title Pt. will be able to ascend/descend 1 flight of stairs with 1 HR and good safety using reciprocal step to access home.    Baseline unable    Time 6    Period Weeks    Status On-going   03/16/20- step to gait up and down stairs with 1 HR  04/08/21- reciprocal step with HR ascending and descending but decreased eccentric control R descending.   Target Date 04/16/21                   Plan - 04/09/21 0826     Clinical Impression Statement Kimberly Boyd is making good progress and has met most of her goals, demonstrating R knee ROM 3-125 degrees today.  She still demonstrates poor eccentric quad control especially with stairs, and tightness throughout calf and hamstrings.  Reviewed HEP and goals today, followed by manual therapy to R knee and dry needling of gastroc/soleus and med/lat hamstrings, followed by game ready at end of session for post-session soreness.  She would benefit from additional 2 visits to fully meet all goals.    Personal Factors and Comorbidities Comorbidity 2    Comorbidities HTN, history SVT, pre-diabetes    PT Frequency --   2-3x per week   PT Duration 6 weeks    PT Treatment/Interventions ADLs/Self Care Home Management;Cryotherapy;Electrical Stimulation;Gait training;Stair training;Functional mobility training;Therapeutic activities;Therapeutic exercise;Balance training;Neuromuscular re-education;Patient/family education;Manual techniques;Scar mobilization;Passive range of motion;Dry  needling;Taping;Vasopneumatic Device;Joint Manipulations    PT Next Visit Plan work on improving extension, WB quad/glute activation, modalities PRN    PT Home Exercise Plan Access Code: [email or web address]    Consulted and Agree with Plan of Care Patient             Patient will benefit from skilled therapeutic intervention in order to improve the following deficits and impairments:  Abnormal gait, Decreased endurance, Decreased skin integrity, Difficulty walking, Increased muscle spasms, Decreased range of motion, Decreased scar mobility, Increased edema, Decreased activity tolerance, Decreased strength, Increased fascial restricitons, Impaired flexibility, Pain, Decreased balance, Decreased mobility  Visit Diagnosis: Acute pain of right knee  Stiffness of right knee, not elsewhere classified  Localized edema  Muscle weakness (generalized)  Difficulty in walking, not elsewhere classified     Problem List Patient Active Problem List   Diagnosis Date Noted   Primary osteoarthritis of right knee 02/23/2021   Other hyperlipidemia 12/22/2020   Prediabetes 05/13/2020   Arthritis of right knee 05/13/2020   Asymptomatic varicose veins of bilateral lower extremities 05/13/2020   Dysplastic nevus 05/13/2020   Elevated erythrocyte sedimentation rate 05/13/2020   Gout  05/13/2020   Gouty arthropathy 05/13/2020   Hot flashes 05/13/2020   Hyperglycemia 05/13/2020   Insomnia 05/13/2020   Menopause 05/13/2020   Iron deficiency anemia due to chronic blood loss 05/13/2020   Pica 05/13/2020   Pure hypercholesterolemia 05/13/2020   Right knee pain 05/13/2020   Sacroiliitis, not elsewhere classified ([place]) 05/13/2020   Vitamin D insufficiency 05/13/2020   Class 1 obesity with serious comorbidity and body mass index (BMI) of 33.0 to 33.9 in adult    Essential hypertension 08/03/2017   Osteoarthritis of right knee 05/20/2015   Fibroid, uterine    Fever 09/30/2010   Supraventricular  tachycardia-probable concealed accessory pathway 08/31/2010   Stress 08/31/2010   ALLERGIC RHINITIS 11/20/2008   GERD 11/20/2008   PALPITATIONS 11/20/2008   CHEST PAIN 11/20/2008    Rennie Natter, PT, DPT  04/09/2021, 8:37 AM  [facility] [address]  [place], [place], [address] Phone: [phone]   Fax:  [phone]  Name: Kimberly Boyd MRN: [id number] Date of Birth: 06-Feb-1966

## 2021-04-12 ENCOUNTER — Other Ambulatory Visit: Payer: Self-pay

## 2021-04-12 ENCOUNTER — Ambulatory Visit: Payer: 59

## 2021-04-12 DIAGNOSIS — M25661 Stiffness of right knee, not elsewhere classified: Secondary | ICD-10-CM

## 2021-04-12 DIAGNOSIS — M25561 Pain in right knee: Secondary | ICD-10-CM

## 2021-04-12 DIAGNOSIS — M6281 Muscle weakness (generalized): Secondary | ICD-10-CM

## 2021-04-12 DIAGNOSIS — R262 Difficulty in walking, not elsewhere classified: Secondary | ICD-10-CM | POA: Diagnosis not present

## 2021-04-12 DIAGNOSIS — R600 Localized edema: Secondary | ICD-10-CM

## 2021-04-12 NOTE — Therapy (Signed)
[facility] [address]  [place], [place], [address] Phone: [phone]   Fax:  [phone]  Physical Therapy Treatment  Patient Details  Name: Kimberly Boyd MRN: [id number] Date of Birth: 1965-11-26 Referring Provider (PT): Melrose Nakayama MD   Encounter Date: 04/12/2021   PT End of Session - 04/12/21 1444     Visit Number 17    Number of Visits 18    Date for PT Re-Evaluation 04/08/21    Authorization Type [facility] VL 25  - 6 visits in 2022    PT Start Time 4825    PT Stop Time 1453    PT Time Calculation (min) 56 min    Activity Tolerance Patient tolerated treatment well    Behavior During Therapy [facility] for tasks assessed/performed             Past Medical History:  Diagnosis Date   Anemia    Anxiety    Arthritis    Arthritis of left foot    Arthritis of right knee    Back pain years.    chronic low back pain    Chronic back pain    Complication of anesthesia    tachycardia, difficulty waking up   GERD (gastroesophageal reflux disease)    Heart murmur    questionable   Hypertension    Obesity    Palpitations    Pre-diabetes    SVT (supraventricular tachycardia) (HCC)     Past Surgical History:  Procedure Laterality Date   BREAST BIOPSY Right    Benign approx age 74   cervical dysplasia laser     dermoid cyst removal     IR GENERIC HISTORICAL  06/04/2014   IR RADIOLOGIST EVAL & MGMT 06/04/2014 Sandi Mariscal, MD [facility] INTERV RAD   LAPAROSCOPIC CHOLECYSTECTOMY     LIPOMA RESECTION     and redone   ORIF ANKLE FRACTURE  08/09/2011   Procedure: OPEN REDUCTION INTERNAL FIXATION (ORIF) ANKLE FRACTURE;  Surgeon: Wylene Simmer, MD;  Location: [place];  Service: Orthopedics;  Laterality: Right;  ORIF Right Ankle Lateral Malleolus   scar revision for the lipoma     TOTAL KNEE ARTHROPLASTY Right 02/23/2021   Procedure: RIGHT TOTAL KNEE ARTHROPLASTY;  Surgeon: Melrose Nakayama, MD;  [facility];   Service: Orthopedics;  Laterality: Right;   WISDOM TOOTH EXTRACTION      There were no vitals filed for this visit.   Subjective Assessment - 04/12/21 1404     Subjective All is good with my knee right now.    Pertinent History PMH - pre-diabetes, obesity, palpitations (SVT)    Diagnostic tests X-ray    Patient Stated Goals walk completely normal, return to exercise    Currently in Pain? No/denies                               [facility] Adult PT Treatment/Exercise - 04/12/21 0001       Knee/Hip Exercises: Aerobic   Recumbent Bike L3x36mn      Knee/Hip Exercises: Machines for Strengthening   Cybex Leg Press 30lb BLE 2x10, 15lb 2x10 R LE      Knee/Hip Exercises: Standing   Side Lunges Right;10 reps    Side Lunges Limitations with TRX    Lateral Step Up Right;2 sets;10 reps;Hand Hold: 1;Step Height: 6"    Lateral Step Up Limitations focus on eccentric lowering  Forward Step Up Right;2 sets;10 reps;Hand Hold: 1;Step Height: 6";Step Height: 8"   1 set 6', 2nd set 8'   Other Standing Knee Exercises pistol squat with TRX 15x      Vasopneumatic   Number Minutes Vasopneumatic  10 minutes    Vasopnuematic Location  Knee    Vasopneumatic Pressure Low    Vasopneumatic Temperature  34                     PT Education - 04/12/21 1444     Education Details HEP update    Person(s) Educated Patient    Methods Explanation;Demonstration    Comprehension Verbalized understanding;Returned demonstration              PT Short Term Goals - 03/12/21 0808       PT SHORT TERM GOAL #1   Title Patient to be independent with initial HEP.    Time 2    Period Weeks    Status Achieved   03/12/21   Target Date 03/11/21               PT Long Term Goals - 04/08/21 1025       PT LONG TERM GOAL #1   Title Patient to be independent with advanced HEP.    Time 6    Period Weeks    Status Achieved   03/16/2021- met for current, very compliant.  04/08/21-  met for current   Target Date 04/08/21      PT LONG TERM GOAL #2   Title Pt. will demonstrate 0-120 deg R knee ROM for safety with gait and stairs.    Baseline 15-78    Time 6    Period Weeks    Status Partially Met   1/19- 3-109 deg  04/08/20- 3-125   Target Date 04/16/21      PT LONG TERM GOAL #3   Title Pt. will demonstrate 5/5 RLE strength for safety with gait.    Baseline significant R quad lag    Time 6    Period Weeks    Status On-going   04/08/20- weakness with R quad eccentric control   Target Date 04/16/21      PT LONG TERM GOAL #4   Title Patient will be able to ambulate 900' without AD and with good safety for community ambulation.    Baseline slow antalgic gait with 2WRW    Time 6    Period Weeks    Status Achieved   03/16/2020- using SPC   Target Date 04/08/21      PT LONG TERM GOAL #5   Title Pt. will be able to ascend/descend 1 flight of stairs with 1 HR and good safety using reciprocal step to access home.    Baseline unable    Time 6    Period Weeks    Status On-going   03/16/20- step to gait up and down stairs with 1 HR  04/08/21- reciprocal step with HR ascending and descending but decreased eccentric control R descending.   Target Date 04/16/21                   Plan - 04/12/21 1445     Clinical Impression Statement Pt showed a better demonstration of steps with less compensation. Able to progress fwd step up with 8' step. Updated step ups to HEP today to improve carryover at home and to improve functional abilities to climb steps. She was able to tolerate  the progression of exercises with cuing to correct technique occassionally.    Personal Factors and Comorbidities Comorbidity 2    Comorbidities HTN, history SVT, pre-diabetes    PT Frequency 2x / week    PT Duration 6 weeks    PT Treatment/Interventions ADLs/Self Care Home Management;Cryotherapy;Electrical Stimulation;Gait training;Stair training;Functional mobility training;Therapeutic  activities;Therapeutic exercise;Balance training;Neuromuscular re-education;Patient/family education;Manual techniques;Scar mobilization;Passive range of motion;Dry needling;Taping;Vasopneumatic Device;Joint Manipulations    PT Next Visit Plan work on improving extension, WB quad/glute activation, modalities PRN    PT Home Exercise Plan Access Code: [email or web address]    Consulted and Agree with Plan of Care Patient             Patient will benefit from skilled therapeutic intervention in order to improve the following deficits and impairments:  Abnormal gait, Decreased endurance, Decreased skin integrity, Difficulty walking, Increased muscle spasms, Decreased range of motion, Decreased scar mobility, Increased edema, Decreased activity tolerance, Decreased strength, Increased fascial restricitons, Impaired flexibility, Pain, Decreased balance, Decreased mobility  Visit Diagnosis: Acute pain of right knee  Stiffness of right knee, not elsewhere classified  Localized edema  Muscle weakness (generalized)  Difficulty in walking, not elsewhere classified     Problem List Patient Active Problem List   Diagnosis Date Noted   Primary osteoarthritis of right knee 02/23/2021   Other hyperlipidemia 12/22/2020   Prediabetes 05/13/2020   Arthritis of right knee 05/13/2020   Asymptomatic varicose veins of bilateral lower extremities 05/13/2020   Dysplastic nevus 05/13/2020   Elevated erythrocyte sedimentation rate 05/13/2020   Gout 05/13/2020   Gouty arthropathy 05/13/2020   Hot flashes 05/13/2020   Hyperglycemia 05/13/2020   Insomnia 05/13/2020   Menopause 05/13/2020   Iron deficiency anemia due to chronic blood loss 05/13/2020   Pica 05/13/2020   Pure hypercholesterolemia 05/13/2020   Right knee pain 05/13/2020   Sacroiliitis, not elsewhere classified ([place]) 05/13/2020   Vitamin D insufficiency 05/13/2020   Class 1 obesity with serious comorbidity and body mass index (BMI) of 33.0 to 33.9  in adult    Essential hypertension 08/03/2017   Osteoarthritis of right knee 05/20/2015   Fibroid, uterine    Fever 09/30/2010   Supraventricular tachycardia-probable concealed accessory pathway 08/31/2010   Stress 08/31/2010   ALLERGIC RHINITIS 11/20/2008   GERD 11/20/2008   PALPITATIONS 11/20/2008   CHEST PAIN 11/20/2008    Artist Pais, PTA 04/12/2021, 3:38 PM  [facility] [address]  [place], [place], [address] Phone: [phone]   Fax:  [phone]  Name: TONIMARIE GRITZ MRN: [id number] Date of Birth: 1966/01/08

## 2021-04-12 NOTE — Patient Instructions (Signed)
Access Code: [email or web address] URL: [email or web address] Date: 04/12/2021 Prepared by: Clarene Essex  Exercises Sit to Stand with Counter Support - 3 x daily - 7 x weekly - 2 sets - 10 reps Seated Knee Extension AROM - 3 x daily - 7 x weekly - 2 sets - 10 reps Seated Quad Set - 3 x daily - 7 x weekly - 1 sets - 10 reps - 10 sec hold Standing Terminal Knee Extension at Wall with Ball - 1 x daily - 7 x weekly - 3 sets - 10 reps Standing Terminal Knee Extension with Resistance - 1 x daily - 7 x weekly - 2 sets - 10 reps - 10 sec hold Forward Step Up - 1 x daily - 3-4 x weekly - 3 sets - 10 reps Lateral Step Up - 1 x daily - 3-4 x weekly - 3 sets - 10 reps

## 2021-04-13 DIAGNOSIS — E7800 Pure hypercholesterolemia, unspecified: Secondary | ICD-10-CM | POA: Diagnosis not present

## 2021-04-13 DIAGNOSIS — M109 Gout, unspecified: Secondary | ICD-10-CM | POA: Diagnosis not present

## 2021-04-13 DIAGNOSIS — I8393 Asymptomatic varicose veins of bilateral lower extremities: Secondary | ICD-10-CM | POA: Diagnosis not present

## 2021-04-13 DIAGNOSIS — I10 Essential (primary) hypertension: Secondary | ICD-10-CM | POA: Diagnosis not present

## 2021-04-13 DIAGNOSIS — R002 Palpitations: Secondary | ICD-10-CM | POA: Diagnosis not present

## 2021-04-13 DIAGNOSIS — R7303 Prediabetes: Secondary | ICD-10-CM | POA: Diagnosis not present

## 2021-04-13 DIAGNOSIS — Z96659 Presence of unspecified artificial knee joint: Secondary | ICD-10-CM | POA: Diagnosis not present

## 2021-04-13 DIAGNOSIS — Z8739 Personal history of other diseases of the musculoskeletal system and connective tissue: Secondary | ICD-10-CM | POA: Diagnosis not present

## 2021-04-13 DIAGNOSIS — Z780 Asymptomatic menopausal state: Secondary | ICD-10-CM | POA: Diagnosis not present

## 2021-04-14 ENCOUNTER — Ambulatory Visit: Payer: 59 | Attending: Orthopaedic Surgery

## 2021-04-14 ENCOUNTER — Other Ambulatory Visit: Payer: Self-pay

## 2021-04-14 DIAGNOSIS — M25661 Stiffness of right knee, not elsewhere classified: Secondary | ICD-10-CM | POA: Diagnosis not present

## 2021-04-14 DIAGNOSIS — R600 Localized edema: Secondary | ICD-10-CM | POA: Insufficient documentation

## 2021-04-14 DIAGNOSIS — M6281 Muscle weakness (generalized): Secondary | ICD-10-CM | POA: Diagnosis not present

## 2021-04-14 DIAGNOSIS — R262 Difficulty in walking, not elsewhere classified: Secondary | ICD-10-CM | POA: Diagnosis not present

## 2021-04-14 DIAGNOSIS — M25561 Pain in right knee: Secondary | ICD-10-CM | POA: Diagnosis not present

## 2021-04-14 NOTE — Therapy (Addendum)
PHYSICAL THERAPY DISCHARGE SUMMARY  Visits from Start of Care: 18  Current functional level related to goals / functional outcomes: Has met or almost met all goals, R knee ROM 3-125, able to ambulate without assistive device, FOTO improved to 77%.    Remaining deficits: Lacking 3 degrees full R knee extension, decreased eccentric control with steps.    Education / Equipment: HEP  Plan: Patient agrees to discharge.  Patient is being discharged due to meeting the stated rehab goals.     Rennie Natter, PT, DPT   [facility] [address]  [place], [place], [address] Phone: [phone]   Fax:  [phone]  Physical Therapy Treatment  Patient Details  Name: Kimberly Boyd MRN: [id number] Date of Birth: 03/23/1965 Referring Provider (PT): Melrose Nakayama MD   Encounter Date: 04/14/2021   PT End of Session - 04/14/21 1152     Visit Number 18    Number of Visits 18    Date for PT Re-Evaluation 04/08/21    Authorization Type [facility] VL 25  - 6 visits in 2022    PT Start Time 1057    PT Stop Time 1156    PT Time Calculation (min) 59 min    Activity Tolerance Patient tolerated treatment well    Behavior During Therapy [facility] for tasks assessed/performed             Past Medical History:  Diagnosis Date   Anemia    Anxiety    Arthritis    Arthritis of left foot    Arthritis of right knee    Back pain years.    chronic low back pain    Chronic back pain    Complication of anesthesia    tachycardia, difficulty waking up   GERD (gastroesophageal reflux disease)    Heart murmur    questionable   Hypertension    Obesity    Palpitations    Pre-diabetes    SVT (supraventricular tachycardia) (HCC)     Past Surgical History:  Procedure Laterality Date   BREAST BIOPSY Right    Benign approx age 69   cervical dysplasia laser     dermoid cyst removal     IR GENERIC HISTORICAL  06/04/2014   IR  RADIOLOGIST EVAL & MGMT 06/04/2014 Sandi Mariscal, MD [facility] INTERV RAD   LAPAROSCOPIC CHOLECYSTECTOMY     LIPOMA RESECTION     and redone   ORIF ANKLE FRACTURE  08/09/2011   Procedure: OPEN REDUCTION INTERNAL FIXATION (ORIF) ANKLE FRACTURE;  Surgeon: Wylene Simmer, MD;  Location: [place];  Service: Orthopedics;  Laterality: Right;  ORIF Right Ankle Lateral Malleolus   scar revision for the lipoma     TOTAL KNEE ARTHROPLASTY Right 02/23/2021   Procedure: RIGHT TOTAL KNEE ARTHROPLASTY;  Surgeon: Melrose Nakayama, MD;  Location: [facility];  Service: Orthopedics;  Laterality: Right;   WISDOM TOOTH EXTRACTION      There were no vitals filed for this visit.   Subjective Assessment - 04/14/21 1058     Subjective Everything is still going fine.    Pertinent History PMH - pre-diabetes, obesity, palpitations (SVT)    Diagnostic tests X-ray    Patient Stated Goals walk completely normal, return to exercise    Currently in Pain? No/denies                [facility] PT Assessment - 04/14/21 0001       Assessment  Medical Diagnosis Z96.659 (ICD-10-CM) - S/P total knee replacement    Referring Provider (PT) Melrose Nakayama MD    Onset Date/Surgical Date 02/23/21      Observation/Other Assessments   Focus on Therapeutic Outcomes (FOTO)  Knee: 77%      AROM   Right Knee Extension 3    Right Knee Flexion 125      Strength   Right Knee Flexion 5/5    Right Knee Extension 5/5                           [facility] Adult PT Treatment/Exercise - 04/14/21 0001       Transfers   Comments 4x floor transfer      Ambulation/Gait   Stairs Yes    Stairs Assistance 7: Independent    Stair Management Technique One rail Right;Alternating pattern    Number of Stairs 12    Height of Stairs 8    Gait Comments mild decreased      Knee/Hip Exercises: Aerobic   Recumbent Bike L4x70mn      Knee/Hip Exercises: Machines for Strengthening   Cybex Knee Extension 20lb 10 reps    Cybex Knee Flexion  20lb 10 reps    Cybex Leg Press 35lb BLE 2x10, 15lb 2x10 R LE      Knee/Hip Exercises: Standing   Forward Lunges Right;2 sets;10 reps    Forward Lunges Limitations with UE support, down to bolster      Vasopneumatic   Number Minutes Vasopneumatic  10 minutes    Vasopnuematic Location  Knee    Vasopneumatic Pressure Low    Vasopneumatic Temperature  34                       PT Short Term Goals - 03/12/21 06269      PT SHORT TERM GOAL #1   Title Patient to be independent with initial HEP.    Time 2    Period Weeks    Status Achieved   03/12/21   Target Date 03/11/21               PT Long Term Goals - 04/14/21 1111       PT LONG TERM GOAL #1   Title Patient to be independent with advanced HEP.    Time 6    Period Weeks    Status Achieved   03/16/2021- met for current, very compliant.  04/08/21- met for current   Target Date 04/08/21      PT LONG TERM GOAL #2   Title Pt. will demonstrate 0-120 deg R knee ROM for safety with gait and stairs.    Baseline 15-78    Time 6    Period Weeks    Status Partially Met   2/1- 3-125   Target Date 04/16/21      PT LONG TERM GOAL #3   Title Pt. will demonstrate 5/5 RLE strength for safety with gait.    Baseline significant R quad lag    Time 6    Period Weeks    Status Achieved   2/1   Target Date 04/16/21      PT LONG TERM GOAL #4   Title Patient will be able to ambulate 900' without AD and with good safety for community ambulation.    Baseline slow antalgic gait with 2WRW    Time 6    Period Weeks    Status  Achieved   03/16/2020- using SPC   Target Date 04/08/21      PT LONG TERM GOAL #5   Title Pt. will be able to ascend/descend 1 flight of stairs with 1 HR and good safety using reciprocal step to access home.    Baseline unable    Time 6    Period Weeks    Status Achieved   04/14/21   Target Date 04/16/21                   Plan - 04/14/21 1152     Clinical Impression Statement Pt is  currently pleased with functional progress. Took today to review floor transfers to allow for her to resume exercises for her chronic lower back issues, reviewed gym equipment to allow for return to gym routine, and checked remaining goals. As of now all LTGs are met with the exception of knee ROM but as time goes on we expect for the last few degrees of extension to come with activity. Pt R knee ROM currently 3-125 deg. She showed a good navigation of steps with only mild decreased quad control descending. Ended session with GR to address swelling/soreness. Pt is returning to work next week and will be D/C.    Personal Factors and Comorbidities Comorbidity 2    Comorbidities HTN, history SVT, pre-diabetes    PT Frequency 2x / week    PT Duration 6 weeks    PT Treatment/Interventions ADLs/Self Care Home Management;Cryotherapy;Electrical Stimulation;Gait training;Stair training;Functional mobility training;Therapeutic activities;Therapeutic exercise;Balance training;Neuromuscular re-education;Patient/family education;Manual techniques;Scar mobilization;Passive range of motion;Dry needling;Taping;Vasopneumatic Device;Joint Manipulations    PT Next Visit Plan D/C    PT Home Exercise Plan Access Code: [email or web address]    [email or web address] and Agree with Plan of Care Patient             Patient will benefit from skilled therapeutic intervention in order to improve the following deficits and impairments:  Abnormal gait, Decreased endurance, Decreased skin integrity, Difficulty walking, Increased muscle spasms, Decreased range of motion, Decreased scar mobility, Increased edema, Decreased activity tolerance, Decreased strength, Increased fascial restricitons, Impaired flexibility, Pain, Decreased balance, Decreased mobility  Visit Diagnosis: Acute pain of right knee  Stiffness of right knee, not elsewhere classified  Localized edema  Muscle weakness (generalized)  Difficulty in walking, not elsewhere  classified     Problem List Patient Active Problem List   Diagnosis Date Noted   Primary osteoarthritis of right knee 02/23/2021   Other hyperlipidemia 12/22/2020   Prediabetes 05/13/2020   Arthritis of right knee 05/13/2020   Asymptomatic varicose veins of bilateral lower extremities 05/13/2020   Dysplastic nevus 05/13/2020   Elevated erythrocyte sedimentation rate 05/13/2020   Gout 05/13/2020   Gouty arthropathy 05/13/2020   Hot flashes 05/13/2020   Hyperglycemia 05/13/2020   Insomnia 05/13/2020   Menopause 05/13/2020   Iron deficiency anemia due to chronic blood loss 05/13/2020   Pica 05/13/2020   Pure hypercholesterolemia 05/13/2020   Right knee pain 05/13/2020   Sacroiliitis, not elsewhere classified ([place]) 05/13/2020   Vitamin D insufficiency 05/13/2020   Class 1 obesity with serious comorbidity and body mass index (BMI) of 33.0 to 33.9 in adult    Essential hypertension 08/03/2017   Osteoarthritis of right knee 05/20/2015   Fibroid, uterine    Fever 09/30/2010   Supraventricular tachycardia-probable concealed accessory pathway 08/31/2010   Stress 08/31/2010   ALLERGIC RHINITIS 11/20/2008   GERD 11/20/2008   PALPITATIONS 11/20/2008   CHEST PAIN 11/20/2008    Taressa Rauh  Renaldo Harrison, PTA 04/14/2021, 12:02 PM  [facility] [address]  [place], [place], [address] Phone: [phone]   Fax:  [phone]  Name: Kimberly Boyd MRN: [id number] Date of Birth: [birth year]

## 2021-04-16 ENCOUNTER — Other Ambulatory Visit (HOSPITAL_COMMUNITY): Payer: Self-pay

## 2021-04-16 MED ORDER — AMOXICILLIN 500 MG PO CAPS
ORAL_CAPSULE | ORAL | 1 refills | Status: DC
  Filled 2021-04-16: qty 12, 3d supply, fill #0

## 2021-04-16 MED ORDER — TRAMADOL HCL 50 MG PO TABS
ORAL_TABLET | ORAL | 0 refills | Status: DC
  Filled 2021-04-16: qty 30, 10d supply, fill #0

## 2021-04-19 ENCOUNTER — Other Ambulatory Visit (HOSPITAL_COMMUNITY): Payer: Self-pay

## 2021-04-20 ENCOUNTER — Other Ambulatory Visit: Payer: Self-pay

## 2021-04-20 ENCOUNTER — Ambulatory Visit (HOSPITAL_COMMUNITY): Payer: 59 | Attending: Cardiology

## 2021-04-20 DIAGNOSIS — I471 Supraventricular tachycardia: Secondary | ICD-10-CM | POA: Insufficient documentation

## 2021-04-20 DIAGNOSIS — R002 Palpitations: Secondary | ICD-10-CM | POA: Diagnosis not present

## 2021-04-20 LAB — ECHOCARDIOGRAM COMPLETE
Area-P 1/2: 3.4 cm2
S' Lateral: 2.7 cm

## 2021-04-28 ENCOUNTER — Other Ambulatory Visit: Payer: Self-pay | Admitting: Obstetrics and Gynecology

## 2021-04-28 DIAGNOSIS — Z1231 Encounter for screening mammogram for malignant neoplasm of breast: Secondary | ICD-10-CM

## 2021-04-29 ENCOUNTER — Encounter: Payer: Self-pay | Admitting: Internal Medicine

## 2021-04-29 ENCOUNTER — Ambulatory Visit: Payer: 59 | Admitting: Internal Medicine

## 2021-04-29 ENCOUNTER — Other Ambulatory Visit: Payer: Self-pay

## 2021-04-29 VITALS — BP 102/72 | HR 68 | Ht 66.5 in | Wt 197.0 lb

## 2021-04-29 DIAGNOSIS — I471 Supraventricular tachycardia: Secondary | ICD-10-CM

## 2021-04-29 DIAGNOSIS — R002 Palpitations: Secondary | ICD-10-CM | POA: Diagnosis not present

## 2021-04-29 DIAGNOSIS — E876 Hypokalemia: Secondary | ICD-10-CM | POA: Diagnosis not present

## 2021-04-29 MED ORDER — MAGNESIUM OXIDE 400 MG PO CAPS: 400.0000 mg | ORAL_CAPSULE | Freq: Every day | ORAL | 3 refills | Status: DC

## 2021-04-29 MED ORDER — FLECAINIDE ACETATE 50 MG PO TABS: 50.0000 mg | ORAL_TABLET | Freq: Two times a day (BID) | ORAL | 1 refills | Status: DC

## 2021-04-29 NOTE — Progress Notes (Signed)
ELECTROPHYSIOLOGY OFFICE NOTE  Patient ID: LAURAL EILAND, MRN: [id number], DOB/AGE: [birth year] 56 y.o. Admit date: (Not on file) Date of Consult: 04/29/2021  Primary Physician: Vernie Shanks, MD Primary Cardiologist: SK      HPI Kimberly Boyd is a 56 y.o. female seen in follow-up for palpitations with a history of   adenosine sensitive SVT   The other irregular palpitations, typically lasting minutes but sometimes up to an hour if there is any information or will be in Care Everywhere from her prior SVT.  Described as irregular.  Seen today also for preoperative assessment  Palpitations have been associated with PACs and PVCs, with 2 interval healthcare visits 1 with EMS and the other with the ER complaining of tachy palpitations and irregular heartbeats and PACs were found again    Date Cr K Hgb  1/23 0.74 4.0<<3.0 12.5         DATE TEST EF   2/23 Echo   65-70 %                Past Medical History:  Diagnosis Date   Anemia    Anxiety    Arthritis    Arthritis of left foot    Arthritis of right knee    Back pain years.    chronic low back pain    Chronic back pain    Complication of anesthesia    tachycardia, difficulty waking up   GERD (gastroesophageal reflux disease)    Heart murmur    questionable   Hypertension    Obesity    Palpitations    Pre-diabetes    SVT (supraventricular tachycardia) ([place])       Surgical History:  Past Surgical History:  Procedure Laterality Date   BREAST BIOPSY Right    Benign approx age 80   cervical dysplasia laser     dermoid cyst removal     IR GENERIC HISTORICAL  06/04/2014   IR RADIOLOGIST EVAL & MGMT 06/04/2014 Sandi Mariscal, MD [facility] INTERV RAD   LAPAROSCOPIC CHOLECYSTECTOMY     LIPOMA RESECTION     and redone   ORIF ANKLE FRACTURE  08/09/2011   Procedure: OPEN REDUCTION INTERNAL FIXATION (ORIF) ANKLE FRACTURE;  Surgeon: Wylene Simmer, MD;  Location: [place];  Service: Orthopedics;  Laterality: Right;  ORIF  Right Ankle Lateral Malleolus   scar revision for the lipoma     TOTAL KNEE ARTHROPLASTY Right 02/23/2021   Procedure: RIGHT TOTAL KNEE ARTHROPLASTY;  Surgeon: Melrose Nakayama, MD;  Location[facility];  Service: Orthopedics;  Laterality: Right;   WISDOM TOOTH EXTRACTION       Home Meds: Current Meds  Medication Sig   ALPRAZolam (XANAX) 0.5 MG tablet Take 0.5 mg by mouth daily as needed for anxiety.   amoxicillin (AMOXIL) 500 MG capsule Take 4 capsules by mouth 1 hour prior to dental visit.   aspirin EC 81 MG tablet Take 1 tablet by mouth 2 times daily after a meal. Swallow whole.   Biotin 2500 MCG CAPS Take 2,500 mcg by mouth daily. Gummy   Cholecalciferol (VITAMIN D3) 50 MCG (2000 UT) TABS Take 4,000 Units by mouth in the morning.   Cholecalciferol 100 MCG (4000 UT) CAPS Take 1 capsule (4,000 Units total) by mouth daily.   ELDERBERRY PO Take 1 tablet by mouth 3 (three) times a week.   Magnesium 250 MG TABS Take 250 mg by mouth 3 (three) times a week.   omeprazole (PRILOSEC) 20  MG capsule Take 1 capsule by mouth once a day   potassium chloride (KLOR-CON M) 10 MEQ tablet Take 1 tablet (10 mEq total) by mouth every other day.   Probiotic Product (PROBIOTIC PO) Take 1 capsule by mouth daily.   propranolol ER (INDERAL LA) 120 MG 24 hr capsule Take 1 capsule (120 mg total) by mouth daily.   tiZANidine (ZANAFLEX) 4 MG tablet Take 1 tablet by mouth every 6 hours as needed for muscle spasms.   traMADol (ULTRAM) 50 MG tablet Take 1 tablet by mouth 2-3 times a day as needed for pain   triamterene-hydrochlorothiazide (MAXZIDE-25) 37.5-25 MG tablet Take 1 tablet by mouth in the morning daily   vitamin C (ASCORBIC ACID) 500 MG tablet Take 1,000 mg by mouth daily.   zinc sulfate 220 (50 Zn) MG capsule Take 220 mg by mouth 3 (three) times a week.  Allergies:  Allergies  Allergen Reactions   Doxycycline Other (See Comments)    Esophagitis; opened up in my esophagus         ROS:  Please see the  history of present illness.     All other systems reviewed and negative.   Physical Exam: BP 102/72    Pulse 68    Ht 5' 6.5" (1.689 m)    Wt 197 lb (89.4 kg)    SpO2 98%    BMI 31.32 kg/m   Well developed and nourished in no acute distress HENT normal Neck supple with JVP-  flat   Clear Regular rate and rhythm, no murmurs or gallops Abd-soft with active BS No Clubbing cyanosis edema Skin-warm and dry A & Oriented  Grossly normal sensory and motor function  ECG sinus      Assessment and Plan:  Palpitations>>PACs /PVCs bundle  SVT  Anxiety  Obesity  Hypokalemia   Continues with symptomatic palpitations.  The ER ECG showed PACs, her AliveCor monitor shows PVCs.  Also had nonsustained SVT on a number of the strips.  This is persisted despite the beta-blockers.  We will continue the Inderal LA 120 mg.  We will try adjunctive low-dose magnesium and if that does not suffice we will get her on flecainide 50 mg twice daily.  She will let us know and then we will arrange for GXT for arrhythmic risk.  We also discussed the potential benefits of biofeedback given the degree of her anxiety response.  She is also requesting the name of restoration place.  Found to be hypokalemic, put on repletion.  Last K was normal.  The question is back distal why it is that she is hypokalemic.  For now we will continue 10 milliequivalents.  Asked  her to follow-up with her PCP about this       Virl Axe

## 2021-04-29 NOTE — Patient Instructions (Signed)
Medication Instructions:  Your physician has recommended you make the following change in your medication:   Begin Magnesium Oxide 400mg  - 1 tablet by mouth daily.  ** Flecainide 50mg  -1 tablet by mouth twice daily.  Please let us know if you decide to start Flecainide.  *If you need a refill on your cardiac medications before your next appointment, please call your pharmacy*   Lab Work: BMET in 2 weeks If you have labs (blood work) drawn today and your tests are completely normal, you will receive your results only by: [place] (if you have MyChart) OR A paper copy in the mail If you have any lab test that is abnormal or we need to change your treatment, we will call you to review the results.   Testing/Procedures: None ordered.    Follow-Up: At [facility], you and your health needs are our priority.  As part of our continuing mission to provide you with exceptional heart care, we have created designated Provider Care Teams.  These Care Teams include your primary Cardiologist (physician) and Advanced Practice Providers (APPs -  Physician Assistants and Nurse Practitioners) who all work together to provide you with the care you need, when you need it.  We recommend signing up for the patient portal called "MyChart".  Sign up information is provided on this After Visit Summary.  MyChart is used to connect with patients for Virtual Visits (Telemedicine).  Patients are able to view lab/test results, encounter notes, upcoming appointments, etc.  Non-urgent messages can be sent to your provider as well.   To learn more about what you can do with MyChart, go to [email or web address].    Your next appointment:   6 months with Dr Caryl Comes

## 2021-05-14 ENCOUNTER — Other Ambulatory Visit: Payer: 59

## 2021-05-17 ENCOUNTER — Other Ambulatory Visit (HOSPITAL_COMMUNITY): Payer: Self-pay

## 2021-05-17 DIAGNOSIS — M1711 Unilateral primary osteoarthritis, right knee: Secondary | ICD-10-CM | POA: Diagnosis not present

## 2021-05-17 DIAGNOSIS — Z9889 Other specified postprocedural states: Secondary | ICD-10-CM | POA: Diagnosis not present

## 2021-05-18 ENCOUNTER — Ambulatory Visit: Payer: 59

## 2021-05-19 ENCOUNTER — Other Ambulatory Visit: Payer: Self-pay | Admitting: Internal Medicine

## 2021-05-19 ENCOUNTER — Other Ambulatory Visit (HOSPITAL_COMMUNITY): Payer: Self-pay

## 2021-05-20 ENCOUNTER — Other Ambulatory Visit (HOSPITAL_COMMUNITY): Payer: Self-pay

## 2021-05-26 ENCOUNTER — Other Ambulatory Visit (HOSPITAL_COMMUNITY): Payer: Self-pay

## 2021-05-26 ENCOUNTER — Ambulatory Visit
Admission: RE | Admit: 2021-05-26 | Discharge: 2021-05-26 | Disposition: A | Discharge status: Home / Self Care | Payer: 59 | Source: Ambulatory Visit | Attending: Obstetrics and Gynecology | Admitting: Obstetrics and Gynecology

## 2021-05-26 DIAGNOSIS — Z1231 Encounter for screening mammogram for malignant neoplasm of breast: Secondary | ICD-10-CM

## 2021-05-31 ENCOUNTER — Ambulatory Visit: Payer: 59 | Admitting: Physician Assistant

## 2021-06-02 ENCOUNTER — Other Ambulatory Visit (HOSPITAL_COMMUNITY): Payer: Self-pay

## 2021-06-07 ENCOUNTER — Encounter: Payer: Self-pay | Admitting: Internal Medicine

## 2021-06-07 ENCOUNTER — Other Ambulatory Visit: Payer: Self-pay | Admitting: Internal Medicine

## 2021-06-07 ENCOUNTER — Other Ambulatory Visit (HOSPITAL_COMMUNITY): Payer: Self-pay

## 2021-06-07 MED ORDER — POTASSIUM CHLORIDE CRYS ER 10 MEQ PO TBCR
10.0000 meq | EXTENDED_RELEASE_TABLET | ORAL | 3 refills | Status: DC
  Filled 2021-06-07: qty 45, 90d supply, fill #0

## 2021-06-10 ENCOUNTER — Other Ambulatory Visit (HOSPITAL_COMMUNITY): Payer: Self-pay

## 2021-06-15 ENCOUNTER — Ambulatory Visit (INDEPENDENT_AMBULATORY_CARE_PROVIDER_SITE_OTHER): Payer: 59 | Admitting: Nurse Practitioner

## 2021-06-15 ENCOUNTER — Encounter (INDEPENDENT_AMBULATORY_CARE_PROVIDER_SITE_OTHER): Payer: Self-pay | Admitting: Nurse Practitioner

## 2021-06-15 VITALS — BP 108/71 | HR 60 | Temp 97.8°F | Ht 67.0 in | Wt 196.0 lb

## 2021-06-15 DIAGNOSIS — Z6830 Body mass index (BMI) 30.0-30.9, adult: Secondary | ICD-10-CM | POA: Diagnosis not present

## 2021-06-15 DIAGNOSIS — R7303 Prediabetes: Secondary | ICD-10-CM

## 2021-06-15 DIAGNOSIS — E669 Obesity, unspecified: Secondary | ICD-10-CM

## 2021-06-15 DIAGNOSIS — I10 Essential (primary) hypertension: Secondary | ICD-10-CM

## 2021-06-16 NOTE — Progress Notes (Signed)
? ? ?Chief Complaint:  ? ?OBESITY ?Kimberly Boyd is here to discuss her progress with her obesity treatment plan along with follow-up of her obesity related diagnoses. Kimberly Boyd is on practicing portion control and making smarter food choices, such as increasing vegetables and decreasing simple carbohydrates and states she is following her eating plan approximately 70% of the time. Kimberly Boyd states she is walking and weights for 60 minutes 6 times per week. ? ?Today's visit was #: 14 ?Starting weight: 213 lbs ?Starting date: 05/12/2020 ?Today's weight: 196 lbs ?Today's date: 06/15/2021 ?Total lbs lost to date: 17 lbs ?Total lbs lost since last in-office visit: 0 ? ?Interim History: Kimberly Boyd was on vacation last week. She is following PC/[facility]. She started going back to the gym the first week in March. She is meeting calorie goals. She doesn't feel meeting protein goals. She is struggling with cravings in the evening. She is drinking water daily. She denies sugar drinks. She tried [place], [place] in the past and stopped both due to side effects.  ? ?Subjective:  ? ?1. Essential hypertension ?Kimberly Boyd's blood pressure looks good today. She is taking Maxzide 37.5-25 mg and Indural LA 120. She denies side effect chest pains and shortness of breath. She is seeing a cardiology on a regular basis. She was last seen on 04/29/2021. ? ?2. Pre-diabetes ?Kimberly Boyd's last A1C was 6.0. She has never been on medications.  ? ?Assessment/Plan:  ? ?1. Essential hypertension ?Kimberly Boyd will continue to follow up with cardiology. She will continue medication as directed. We will check labs at next visit. She is working on healthy weight loss and exercise to improve blood pressure control. We will watch for signs of hypotension as she continues her lifestyle modifications. ? ?2. Pre-diabetes ?Kimberly Boyd will continue to work on weight loss, exercise, and decreasing simple carbohydrates to help decrease the risk of diabetes. We will check fasting labs at next visit.   ? ?3. Obesity: Current BMI 30.8 ?Kimberly Boyd is currently in the action stage of change. As such, her goal is to continue with weight loss efforts. She has agreed to practicing portion control and making smarter food choices, such as increasing vegetables and decreasing simple carbohydrates.  ? ?Handouts: Protein content of foods. She will consider Contrave. She will discuss with cardiology.  ? ?Exercise goals:  As is. ? ?Behavioral modification strategies: increasing lean protein intake, increasing water intake, no skipping meals, and meal planning and cooking strategies. ? ?Kimberly Boyd has agreed to follow-up with our clinic in 3 weeks. She was informed of the importance of frequent follow-up visits to maximize her success with intensive lifestyle modifications for her multiple health conditions.  ? ?Objective:  ? ?Blood pressure 108/71, pulse 60, temperature 97.8 ?F (36.6 ?C), height '5\' 7"'$  (1.702 m), weight 196 lb (88.9 kg), SpO2 100 %. ?Body mass index is 30.7 kg/m?. ? ?General: Cooperative, alert, well developed, in no acute distress. ?HEENT: Conjunctivae and lids unremarkable. ?Cardiovascular: Regular rhythm.  ?Lungs: Normal work of breathing. ?Neurologic: No focal deficits.  ? ?Lab Results  ?Component Value Date  ? CREATININE 0.74 04/05/2021  ? BUN 17 04/05/2021  ? NA 144 04/05/2021  ? K 4.0 04/05/2021  ? CL 103 04/05/2021  ? CO2 28 04/05/2021  ? ?Lab Results  ?Component Value Date  ? ALT 24 02/20/2021  ? AST 19 02/20/2021  ? ALKPHOS 67 02/20/2021  ? BILITOT 0.2 (L) 02/20/2021  ? ?Lab Results  ?Component Value Date  ? HGBA1C 6.0 (H) 02/12/2021  ? HGBA1C 5.9 (H) 11/24/2020  ?  HGBA1C 6.2 (H) 05/12/2020  ? ?Lab Results  ?Component Value Date  ? INSULIN 7.4 11/24/2020  ? INSULIN 12.4 05/12/2020  ? ?Lab Results  ?Component Value Date  ? TSH 2.300 02/20/2021  ? ?Lab Results  ?Component Value Date  ? CHOL 180 11/24/2020  ? HDL 59 11/24/2020  ? LDLCALC 110 (H) 11/24/2020  ? TRIG 57 11/24/2020  ? ?Lab Results  ?Component Value  Date  ? VD25OH 36.3 11/24/2020  ? VD25OH 40.8 05/12/2020  ? ?Lab Results  ?Component Value Date  ? WBC 6.7 02/20/2021  ? HGB 12.5 02/20/2021  ? HCT 36.6 02/20/2021  ? MCV 90.6 02/20/2021  ? PLT 315 02/20/2021  ? ?No results found for: IRON, TIBC, FERRITIN ? ?Attestation Statements:  ? ?Reviewed by clinician on day of visit: allergies, medications, problem list, medical history, surgical history, family history, social history, and previous encounter notes. ? ?Time spent on visit including pre-visit chart review and post-visit care and charting was 30 minutes.  ? ?I, Lizbeth Bark, RMA, am acting as Location manager for Everardo Pacific, FNP. ? ?I have reviewed the above documentation for accuracy and completeness, and I agree with the above. Everardo Pacific, FNP  ?

## 2021-06-18 ENCOUNTER — Other Ambulatory Visit (HOSPITAL_COMMUNITY): Payer: Self-pay

## 2021-06-21 ENCOUNTER — Other Ambulatory Visit (HOSPITAL_COMMUNITY): Payer: Self-pay

## 2021-07-08 ENCOUNTER — Ambulatory Visit (INDEPENDENT_AMBULATORY_CARE_PROVIDER_SITE_OTHER): Payer: 59 | Admitting: Physician Assistant

## 2021-07-20 ENCOUNTER — Other Ambulatory Visit (HOSPITAL_COMMUNITY): Payer: Self-pay

## 2021-08-05 ENCOUNTER — Encounter (INDEPENDENT_AMBULATORY_CARE_PROVIDER_SITE_OTHER): Payer: Self-pay | Admitting: Nurse Practitioner

## 2021-08-05 ENCOUNTER — Ambulatory Visit (INDEPENDENT_AMBULATORY_CARE_PROVIDER_SITE_OTHER): Payer: 59 | Admitting: Nurse Practitioner

## 2021-08-05 VITALS — BP 106/52 | HR 56 | Temp 97.9°F | Ht 67.0 in | Wt 199.0 lb

## 2021-08-05 DIAGNOSIS — E669 Obesity, unspecified: Secondary | ICD-10-CM | POA: Diagnosis not present

## 2021-08-05 DIAGNOSIS — E7849 Other hyperlipidemia: Secondary | ICD-10-CM | POA: Diagnosis not present

## 2021-08-05 DIAGNOSIS — R7303 Prediabetes: Secondary | ICD-10-CM | POA: Diagnosis not present

## 2021-08-05 DIAGNOSIS — Z6831 Body mass index (BMI) 31.0-31.9, adult: Secondary | ICD-10-CM | POA: Diagnosis not present

## 2021-08-05 DIAGNOSIS — I10 Essential (primary) hypertension: Secondary | ICD-10-CM

## 2021-08-05 DIAGNOSIS — Z6833 Body mass index (BMI) 33.0-33.9, adult: Secondary | ICD-10-CM | POA: Diagnosis not present

## 2021-08-06 LAB — COMPREHENSIVE METABOLIC PANEL
ALT: 20 IU/L (ref 0–32)
AST: 19 IU/L (ref 0–40)
Albumin/Globulin Ratio: 1.3 (ref 1.2–2.2)
Albumin: 4.4 g/dL (ref 3.8–4.9)
Alkaline Phosphatase: 75 IU/L (ref 44–121)
BUN/Creatinine Ratio: 16 (ref 9–23)
BUN: 13 mg/dL (ref 6–24)
Bilirubin Total: 0.5 mg/dL (ref 0.0–1.2)
CO2: 28 mmol/L (ref 20–29)
Calcium: 10.2 mg/dL (ref 8.7–10.2)
Chloride: 102 mmol/L (ref 96–106)
Creatinine, Ser: 0.8 mg/dL (ref 0.57–1.00)
Globulin, Total: 3.5 g/dL (ref 1.5–4.5)
Glucose: 99 mg/dL (ref 70–99)
Potassium: 4 mmol/L (ref 3.5–5.2)
Sodium: 143 mmol/L (ref 134–144)
Total Protein: 7.9 g/dL (ref 6.0–8.5)
eGFR: 87 mL/min/{1.73_m2} (ref >59)

## 2021-08-06 LAB — LIPID PANEL WITH LDL/HDL RATIO
Cholesterol, Total: 208 mg/dL — ABNORMAL HIGH (ref 100–199)
HDL: 74 mg/dL (ref >39)
LDL Chol Calc (NIH): 124 mg/dL — ABNORMAL HIGH (ref 0–99)
LDL/HDL Ratio: 1.7 ratio (ref 0.0–3.2)
Triglycerides: 53 mg/dL (ref 0–149)
VLDL Cholesterol Cal: 10 mg/dL (ref 5–40)

## 2021-08-06 LAB — HEMOGLOBIN A1C
Est. average glucose Bld gHb Est-mCnc: 131 mg/dL
Hgb A1c MFr Bld: 6.2 % — ABNORMAL HIGH (ref 4.8–5.6)

## 2021-08-06 LAB — INSULIN, RANDOM: INSULIN: 7.2 u[IU]/mL (ref 2.6–24.9)

## 2021-08-12 NOTE — Progress Notes (Signed)
Chief Complaint:   OBESITY Kimberly Boyd is here to discuss her progress with her obesity treatment plan along with follow-up of her obesity related diagnoses. [name] is on practicing portion control and making smarter food choices, such as increasing vegetables and decreasing simple carbohydrates and states she is following her eating plan approximately 75% of the time. [name] states she is walking for 40 minutes 6 times per week.  Today's visit was #: 15 Starting weight: 213 lbs Starting date: 05/12/2020 Today's weight: 199 lbs Today's date: 08/05/2021 Total lbs lost to date: 14 lbs Total lbs lost since last in-office visit: 0  Interim History: [name] feels like she has gotten off track since her last visit. She hasn't been exercising.  Recently started back and plans to start resistance training. She is drinking water but not enough. She craves salty foods. She tried [place] in the past and stopped due to side effects. She has questions about Intermittent fasting today. She states knee pain is overall better since having surgery.   Subjective:   1. Essential hypertension [name]'s blood pressure looks good today 106/52. She is taking Maxzide 37.5-25 mg. She denies chest pain, shortness of breath, and palpitations.   2. Pre-diabetes [name] has never been on medications.   3. Other hyperlipidemia [name] has never been on medications.   Assessment/Plan:   1. Essential hypertension [name] will continue to follow up with her primary care physician. She will continue her medications as directed. Labs were obtained today. She is working on healthy weight loss and exercise to improve blood pressure control. We will watch for signs of hypotension as she continues her lifestyle modifications.  - Comprehensive metabolic panel  2. Pre-diabetes [name] will continue to work on weight loss, exercise, and decreasing simple carbohydrates to help decrease the risk of diabetes. Labs were  obtained today.   - Hemoglobin A1c - Comprehensive metabolic panel - Insulin, random  3. Other hyperlipidemia Cardiovascular risk and specific lipid/LDL goals reviewed.  We discussed several lifestyle modifications today and [name] will continue to work on diet, exercise and weight loss efforts. Orders and follow up as documented in patient record. Labs were obtained today.   Counseling Intensive lifestyle modifications are the first line treatment for this issue. Dietary changes: Increase soluble fiber. Decrease simple carbohydrates. Exercise changes: Moderate to vigorous-intensity aerobic activity 150 minutes per week if tolerated. Lipid-lowering medications: see documented in medical record.  - Comprehensive metabolic panel - Lipid Panel With LDL/HDL Ratio  4. Obesity: Current BMI 31.2 [name] is currently in the action stage of change. As such, her goal is to continue with weight loss efforts. She has agreed to practicing portion control and making smarter food choices, such as increasing vegetables and decreasing simple carbohydrates. [name] was encouraged Journaling.   Exercise goals:  As is.   Behavioral modification strategies: increasing lean protein intake, increasing water intake, no skipping meals, and planning for success.  [name] has agreed to follow-up with our clinic in 4 weeks. She was informed of the importance of frequent follow-up visits to maximize her success with intensive lifestyle modifications for her multiple health conditions.   Objective:   Blood pressure (!) 106/52, pulse (!) 56, temperature 97.9 F (36.6 C), height '5\' 7"'$  (1.702 m), weight 199 lb (90.3 kg), SpO2 100 %. Body mass index is 31.17 kg/m.  General: Cooperative, alert, well developed, in no acute distress. HEENT: Conjunctivae and lids unremarkable. Cardiovascular: Regular rhythm.  Lungs: Normal work of breathing. Neurologic:  No focal deficits.   Lab Results  Component Value Date    CREATININE 0.80 08/05/2021   BUN 13 08/05/2021   NA 143 08/05/2021   K 4.0 08/05/2021   CL 102 08/05/2021   CO2 28 08/05/2021   Lab Results  Component Value Date   ALT 20 08/05/2021   AST 19 08/05/2021   ALKPHOS 75 08/05/2021   BILITOT 0.5 08/05/2021   Lab Results  Component Value Date   HGBA1C 6.2 (H) 08/05/2021   HGBA1C 6.0 (H) 02/12/2021   HGBA1C 5.9 (H) 11/24/2020   HGBA1C 6.2 (H) 05/12/2020   Lab Results  Component Value Date   INSULIN 7.2 08/05/2021   INSULIN 7.4 11/24/2020   INSULIN 12.4 05/12/2020   Lab Results  Component Value Date   TSH 2.300 02/20/2021   Lab Results  Component Value Date   CHOL 208 (H) 08/05/2021   HDL 74 08/05/2021   LDLCALC 124 (H) 08/05/2021   TRIG 53 08/05/2021   Lab Results  Component Value Date   VD25OH 36.3 11/24/2020   VD25OH 40.8 05/12/2020   Lab Results  Component Value Date   WBC 6.7 02/20/2021   HGB 12.5 02/20/2021   HCT 36.6 02/20/2021   MCV 90.6 02/20/2021   PLT 315 02/20/2021   No results found for: IRON, TIBC, FERRITIN  Attestation Statements:   Reviewed by clinician on day of visit: allergies, medications, problem list, medical history, surgical history, family history, social history, and previous encounter notes.  I, Lizbeth Bark, RMA, am acting as Location manager for Everardo Pacific, FNP.  I have reviewed the above documentation for accuracy and completeness, and I agree with the above. Everardo Pacific, FNP

## 2021-08-24 ENCOUNTER — Other Ambulatory Visit (HOSPITAL_COMMUNITY): Payer: Self-pay

## 2021-09-07 ENCOUNTER — Encounter (INDEPENDENT_AMBULATORY_CARE_PROVIDER_SITE_OTHER): Payer: Self-pay | Admitting: Nurse Practitioner

## 2021-09-07 ENCOUNTER — Ambulatory Visit (INDEPENDENT_AMBULATORY_CARE_PROVIDER_SITE_OTHER): Payer: 59 | Admitting: Nurse Practitioner

## 2021-09-07 ENCOUNTER — Other Ambulatory Visit (HOSPITAL_COMMUNITY): Payer: Self-pay

## 2021-09-07 VITALS — BP 117/81 | HR 59 | Temp 98.2°F | Ht 67.0 in | Wt 200.0 lb

## 2021-09-07 DIAGNOSIS — E669 Obesity, unspecified: Secondary | ICD-10-CM | POA: Diagnosis not present

## 2021-09-07 DIAGNOSIS — Z6831 Body mass index (BMI) 31.0-31.9, adult: Secondary | ICD-10-CM

## 2021-09-07 DIAGNOSIS — E7849 Other hyperlipidemia: Secondary | ICD-10-CM

## 2021-09-07 DIAGNOSIS — R7303 Prediabetes: Secondary | ICD-10-CM | POA: Diagnosis not present

## 2021-09-07 MED ORDER — METFORMIN HCL 500 MG PO TABS
500.0000 mg | ORAL_TABLET | Freq: Every day | ORAL | 0 refills | Status: DC
  Filled 2021-09-07: qty 30, 30d supply, fill #0

## 2021-09-08 NOTE — Progress Notes (Signed)
Chief Complaint:   OBESITY Kimberly Boyd is here to discuss her progress with her obesity treatment plan along with follow-up of her obesity related diagnoses. Kimberly Boyd is on practicing portion control and making smarter food choices, such as increasing vegetables and decreasing simple carbohydrates and states she is following her eating plan approximately 60% of the time. Kimberly Boyd states she is walking for 40 minutes 4 times per week.  Today's visit was #: 45 Starting weight: 213 lbs Starting date: 05/12/2020 Today's weight: 200 lbs Today's date: 09/07/2021 Total lbs lost to date: 13 Total lbs lost since last in-office visit: 0  Interim History: Kimberly Boyd has not been journaling.  She is following portion [employer].  She has gotten off track and finds it hard getting back on track.  She started walking again over the past week.  She tends to get off track, eats more and is more moody at the end of the month.  She has tried [place] in the past, and stopped both due to side effects.  Subjective:   1. Other hyperlipidemia Kimberly Boyd is not on medications.  She reports she has been eating terrible over the past month.  I discussed labs with the patient today.  2. Pre-diabetes Kimberly Boyd's last A1c was 6.2.  She has never been on metformin.  She is struggling with hunger and cravings especially in the evenings.  I discussed labs with the patient today.  Assessment/Plan:   1. Other hyperlipidemia Cardiovascular risk and specific lipid/LDL goals reviewed.  We discussed several lifestyle modifications today and Kimberly Boyd will continue to work on diet, exercise and weight loss efforts. Orders and follow up as documented in patient record.   Counseling Intensive lifestyle modifications are the first line treatment for this issue. Dietary changes: Increase soluble fiber. Decrease simple carbohydrates. Exercise changes: Moderate to vigorous-intensity aerobic activity 150 minutes per week if  tolerated. Lipid-lowering medications: see documented in medical record.  2. Pre-diabetes Handouts on insulin resistance and prediabetes, as well as metformin was given to the patient today. Kimberly Boyd agreed to start metformin 500 mg daily with breakfast, with no refills.  Side effects were discussed.  - metFORMIN (GLUCOPHAGE) 500 MG tablet; Take 1 tablet (500 mg total) by mouth daily with breakfast.  Dispense: 30 tablet; Refill: 0  3. Obesity: Current BMI 31.4 Kimberly Boyd is currently in the action stage of change. As such, her goal is to continue with weight loss efforts. She has agreed to keeping a food journal and adhering to recommended goals of 1400 calories and 90 grams of protein daily.   Exercise goals: As is.   Behavioral modification strategies: increasing lean protein intake, increasing water intake, and keeping a strict food journal.  Kimberly Boyd has agreed to follow-up with our clinic in 4 weeks. She was informed of the importance of frequent follow-up visits to maximize her success with intensive lifestyle modifications for her multiple health conditions.   Objective:   Blood pressure 117/81, pulse (!) 59, temperature 98.2 F (36.8 C), height '5\' 7"'$  (1.702 m), weight 200 lb (90.7 kg), SpO2 98 %. Body mass index is 31.32 kg/m.  General: Cooperative, alert, well developed, in no acute distress. HEENT: Conjunctivae and lids unremarkable. Cardiovascular: Regular rhythm.  Lungs: Normal work of breathing. Neurologic: No focal deficits.   Lab Results  Component Value Date   CREATININE 0.80 08/05/2021   BUN 13 08/05/2021   NA 143 08/05/2021   K 4.0 08/05/2021   CL 102 08/05/2021   CO2  28 08/05/2021   Lab Results  Component Value Date   ALT 20 08/05/2021   AST 19 08/05/2021   ALKPHOS 75 08/05/2021   BILITOT 0.5 08/05/2021   Lab Results  Component Value Date   HGBA1C 6.2 (H) 08/05/2021   HGBA1C 6.0 (H) 02/12/2021   HGBA1C 5.9 (H) 11/24/2020   HGBA1C 6.2 (H) 05/12/2020    Lab Results  Component Value Date   INSULIN 7.2 08/05/2021   INSULIN 7.4 11/24/2020   INSULIN 12.4 05/12/2020   Lab Results  Component Value Date   TSH 2.300 02/20/2021   Lab Results  Component Value Date   CHOL 208 (H) 08/05/2021   HDL 74 08/05/2021   LDLCALC 124 (H) 08/05/2021   TRIG 53 08/05/2021   Lab Results  Component Value Date   VD25OH 36.3 11/24/2020   VD25OH 40.8 05/12/2020   Lab Results  Component Value Date   WBC 6.7 02/20/2021   HGB 12.5 02/20/2021   HCT 36.6 02/20/2021   MCV 90.6 02/20/2021   PLT 315 02/20/2021   No results found for: "IRON", "TIBC", "FERRITIN"  Attestation Statements:   Reviewed by clinician on day of visit: allergies, medications, problem list, medical history, surgical history, family history, social history, and previous encounter notes.   Wilhemena Durie, am acting as Location manager for [employer], FNP-C.  I have reviewed the above documentation for accuracy and completeness, and I agree with the above. Everardo Pacific, FNP

## 2021-09-16 ENCOUNTER — Other Ambulatory Visit (HOSPITAL_COMMUNITY): Payer: Self-pay

## 2021-09-17 ENCOUNTER — Other Ambulatory Visit (HOSPITAL_COMMUNITY): Payer: Self-pay

## 2021-09-17 MED ORDER — TRIAMTERENE-HCTZ 37.5-25 MG PO TABS
ORAL_TABLET | ORAL | 0 refills | Status: DC
  Filled 2021-09-17: qty 90, 90d supply, fill #0

## 2021-09-17 MED ORDER — PROPRANOLOL HCL ER 120 MG PO CP24
ORAL_CAPSULE | ORAL | 0 refills | Status: DC
  Filled 2021-09-17: qty 90, 90d supply, fill #0

## 2021-09-21 ENCOUNTER — Other Ambulatory Visit (HOSPITAL_COMMUNITY): Payer: Self-pay

## 2021-10-01 ENCOUNTER — Encounter (INDEPENDENT_AMBULATORY_CARE_PROVIDER_SITE_OTHER): Payer: Self-pay | Admitting: Nurse Practitioner

## 2021-10-04 ENCOUNTER — Ambulatory Visit (INDEPENDENT_AMBULATORY_CARE_PROVIDER_SITE_OTHER): Payer: 59 | Admitting: Nurse Practitioner

## 2021-10-04 NOTE — Telephone Encounter (Signed)
Patient's appointment has been cancelled.

## 2021-10-08 ENCOUNTER — Other Ambulatory Visit (HOSPITAL_COMMUNITY): Payer: Self-pay

## 2021-10-08 DIAGNOSIS — R7303 Prediabetes: Secondary | ICD-10-CM | POA: Diagnosis not present

## 2021-10-08 DIAGNOSIS — F419 Anxiety disorder, unspecified: Secondary | ICD-10-CM | POA: Diagnosis not present

## 2021-10-08 DIAGNOSIS — Z23 Encounter for immunization: Secondary | ICD-10-CM | POA: Diagnosis not present

## 2021-10-08 DIAGNOSIS — R002 Palpitations: Secondary | ICD-10-CM | POA: Diagnosis not present

## 2021-10-08 DIAGNOSIS — M25561 Pain in right knee: Secondary | ICD-10-CM | POA: Diagnosis not present

## 2021-10-08 DIAGNOSIS — I10 Essential (primary) hypertension: Secondary | ICD-10-CM | POA: Diagnosis not present

## 2021-10-08 DIAGNOSIS — Z0000 Encounter for general adult medical examination without abnormal findings: Secondary | ICD-10-CM | POA: Diagnosis not present

## 2021-10-08 DIAGNOSIS — E669 Obesity, unspecified: Secondary | ICD-10-CM | POA: Diagnosis not present

## 2021-10-08 MED ORDER — TRIAMTERENE-HCTZ 37.5-25 MG PO TABS
1.0000 | ORAL_TABLET | Freq: Every morning | ORAL | 3 refills | Status: DC
  Filled 2021-10-08 – 2022-01-01 (×2): qty 90, 90d supply, fill #0
  Filled 2022-04-07: qty 90, 90d supply, fill #1
  Filled 2022-07-26 – 2022-07-27 (×2): qty 90, 90d supply, fill #2

## 2021-10-08 MED ORDER — PROPRANOLOL HCL ER 120 MG PO CP24
120.0000 mg | ORAL_CAPSULE | Freq: Every day | ORAL | 3 refills | Status: DC
  Filled 2021-10-08 – 2022-01-01 (×2): qty 90, 90d supply, fill #0
  Filled 2022-04-07: qty 90, 90d supply, fill #1
  Filled 2022-07-26 – 2022-07-27 (×2): qty 90, 90d supply, fill #2

## 2021-10-08 MED ORDER — POTASSIUM CHLORIDE CRYS ER 10 MEQ PO TBCR
EXTENDED_RELEASE_TABLET | ORAL | 3 refills | Status: DC
  Filled 2021-10-08 – 2021-12-25 (×2): qty 90, 90d supply, fill #0

## 2021-10-20 ENCOUNTER — Encounter (INDEPENDENT_AMBULATORY_CARE_PROVIDER_SITE_OTHER): Payer: Self-pay

## 2021-10-22 ENCOUNTER — Other Ambulatory Visit (HOSPITAL_COMMUNITY): Payer: Self-pay

## 2021-11-09 DIAGNOSIS — M25511 Pain in right shoulder: Secondary | ICD-10-CM | POA: Diagnosis not present

## 2021-11-18 ENCOUNTER — Encounter (INDEPENDENT_AMBULATORY_CARE_PROVIDER_SITE_OTHER): Payer: Self-pay | Admitting: Nurse Practitioner

## 2021-11-18 ENCOUNTER — Other Ambulatory Visit (HOSPITAL_COMMUNITY): Payer: Self-pay

## 2021-11-18 ENCOUNTER — Ambulatory Visit (INDEPENDENT_AMBULATORY_CARE_PROVIDER_SITE_OTHER): Payer: 59 | Admitting: Nurse Practitioner

## 2021-11-18 VITALS — BP 106/71 | HR 64 | Temp 98.3°F | Ht 67.0 in | Wt 200.0 lb

## 2021-11-18 DIAGNOSIS — E7849 Other hyperlipidemia: Secondary | ICD-10-CM | POA: Diagnosis not present

## 2021-11-18 DIAGNOSIS — R7303 Prediabetes: Secondary | ICD-10-CM

## 2021-11-18 DIAGNOSIS — Z6831 Body mass index (BMI) 31.0-31.9, adult: Secondary | ICD-10-CM

## 2021-11-18 DIAGNOSIS — E66811 Obesity, class 1: Secondary | ICD-10-CM

## 2021-11-18 DIAGNOSIS — E559 Vitamin D deficiency, unspecified: Secondary | ICD-10-CM

## 2021-11-18 DIAGNOSIS — R5383 Other fatigue: Secondary | ICD-10-CM

## 2021-11-18 DIAGNOSIS — E669 Obesity, unspecified: Secondary | ICD-10-CM

## 2021-11-18 MED ORDER — METFORMIN HCL 500 MG PO TABS
500.0000 mg | ORAL_TABLET | Freq: Every day | ORAL | 0 refills | Status: DC
  Filled 2021-11-18: qty 30, 30d supply, fill #0

## 2021-11-19 LAB — LIPID PANEL WITH LDL/HDL RATIO
Cholesterol, Total: 207 mg/dL — ABNORMAL HIGH (ref 100–199)
HDL: 72 mg/dL (ref >39)
LDL Chol Calc (NIH): 124 mg/dL — ABNORMAL HIGH (ref 0–99)
LDL/HDL Ratio: 1.7 ratio (ref 0.0–3.2)
Triglycerides: 62 mg/dL (ref 0–149)
VLDL Cholesterol Cal: 11 mg/dL (ref 5–40)

## 2021-11-19 LAB — CBC WITH DIFFERENTIAL/PLATELET
Basophils Absolute: 0 10*3/uL (ref 0.0–0.2)
Basos: 1 %
EOS (ABSOLUTE): 0 10*3/uL (ref 0.0–0.4)
Eos: 1 %
Hematocrit: 39.2 % (ref 34.0–46.6)
Hemoglobin: 13.1 g/dL (ref 11.1–15.9)
Immature Grans (Abs): 0 10*3/uL (ref 0.0–0.1)
Immature Granulocytes: 0 %
Lymphocytes Absolute: 1.8 10*3/uL (ref 0.7–3.1)
Lymphs: 45 %
MCH: 31.1 pg (ref 26.6–33.0)
MCHC: 33.4 g/dL (ref 31.5–35.7)
MCV: 93 fL (ref 79–97)
Monocytes Absolute: 0.4 10*3/uL (ref 0.1–0.9)
Monocytes: 9 %
Neutrophils Absolute: 1.8 10*3/uL (ref 1.4–7.0)
Neutrophils: 44 %
Platelets: 294 10*3/uL (ref 150–450)
RBC: 4.21 x10E6/uL (ref 3.77–5.28)
RDW: 13 % (ref 11.7–15.4)
WBC: 4 10*3/uL (ref 3.4–10.8)

## 2021-11-19 LAB — COMPREHENSIVE METABOLIC PANEL
ALT: 21 IU/L (ref 0–32)
AST: 18 IU/L (ref 0–40)
Albumin/Globulin Ratio: 1.1 — ABNORMAL LOW (ref 1.2–2.2)
Albumin: 4.1 g/dL (ref 3.8–4.9)
Alkaline Phosphatase: 70 IU/L (ref 44–121)
BUN/Creatinine Ratio: 20 (ref 9–23)
BUN: 14 mg/dL (ref 6–24)
Bilirubin Total: 0.4 mg/dL (ref 0.0–1.2)
CO2: 27 mmol/L (ref 20–29)
Calcium: 9.9 mg/dL (ref 8.7–10.2)
Chloride: 100 mmol/L (ref 96–106)
Creatinine, Ser: 0.71 mg/dL (ref 0.57–1.00)
Globulin, Total: 3.6 g/dL (ref 1.5–4.5)
Glucose: 99 mg/dL (ref 70–99)
Potassium: 3.7 mmol/L (ref 3.5–5.2)
Sodium: 140 mmol/L (ref 134–144)
Total Protein: 7.7 g/dL (ref 6.0–8.5)
eGFR: 100 mL/min/{1.73_m2} (ref >59)

## 2021-11-19 LAB — HEMOGLOBIN A1C
Est. average glucose Bld gHb Est-mCnc: 126 mg/dL
Hgb A1c MFr Bld: 6 % — ABNORMAL HIGH (ref 4.8–5.6)

## 2021-11-19 LAB — INSULIN, RANDOM: INSULIN: 6.6 u[IU]/mL (ref 2.6–24.9)

## 2021-11-19 LAB — VITAMIN B12: Vitamin B-12: 580 pg/mL (ref 232–1245)

## 2021-11-19 LAB — TSH: TSH: 1.59 u[IU]/mL (ref 0.450–4.500)

## 2021-11-19 LAB — FERRITIN: Ferritin: 55 ng/mL (ref 15–150)

## 2021-11-22 NOTE — Progress Notes (Unsigned)
Chief Complaint:   OBESITY Kimberly Boyd is here to discuss her progress with her obesity treatment plan along with follow-up of her obesity related diagnoses. Kimberly Boyd is on practicing portion control and making smarter food choices, such as increasing vegetables and decreasing simple carbohydrates and states she is following her eating plan approximately ?% of the time. Pat states she is walking and bike 60-120 minutes 4 times per week.  Today's visit was #: 7 Starting weight: 213 lbs Starting date: 05/12/2021 Today's weight: 200 lbs Today's date: 11/18/2021 Total lbs lost to date: 13 lbs Total lbs lost since last in-office visit: 0  Interim History: Kimberly Boyd was seen here last on 09/01/21. She has lost 2 family members since her last visit. Struggling with hunger and cravings. She has taken [place] in the past. Trying to follow [place]. Does not want to follow Cat 2 or LCP. Drinking water w/flavoring, Almond milk and rarely denies sodas. Going on vacation in October.  Subjective:   1. Pre-diabetes Kimberly Boyd is taking Metformin 500 mg 3 times a per week. Takes it at night due to some fatigue in the am. Hard to tell if helping or not. Struggles with polyphagia and cravings.   2. Other hyperlipidemia Kimberly Boyd has never been on medication.  3. Vitamin D insufficiency Kimberly Boyd has taken Vit D 2,000 IU off and on. Has taken Vit D 50,000 IU in the past. Notes some fatigue.  4. Other fatigue Kimberly Boyd struggles with fatigue. Has taken Vit D and Vit B12 in the past. Stopped Vit B12 due to  side effects. Has history of anemia. Had PICA in the past and took iron.  Assessment/Plan:   1. Pre-diabetes We will obtain labs today. We will refill Metformin 500 mg daily for 1 month with 0 refills.  Velera will continue to work on weight loss, exercise, and decreasing simple carbohydrates to help decrease the risk of diabetes.    -Refill metFORMIN (GLUCOPHAGE) 500 MG tablet; Take 1 tablet (500 mg  total) by mouth daily with breakfast.  Dispense: 30 tablet; Refill: 0  - Comprehensive metabolic panel - Hemoglobin A1c - Insulin, random  2. Other hyperlipidemia We will obtain labs today.  - Comprehensive metabolic panel - Lipid Panel With LDL/HDL Ratio  3. Vitamin D insufficiency We will obtain labs today.  - Comprehensive metabolic panel  4. Other fatigue We will obtain labs today.  - Vitamin B12 - TSH - CBC with Differential/Platelet - Ferritin - Iron  5. Obesity: Current BMI 31.4 Kimberly Boyd is currently in the action stage of change. As such, her goal is to continue with weight loss efforts. She has agreed to keeping a food journal and adhering to recommended goals of 1200 calories and 75+ grams of protein.   Exercise goals: As is. Resistance training.  We will obtain labs today.  Behavioral modification strategies: increasing lean protein intake, increasing vegetables, and increasing water intake.  Kimberly Boyd has agreed to follow-up with our clinic in 4 weeks. She was informed of the importance of frequent follow-up visits to maximize her success with intensive lifestyle modifications for her multiple health conditions.   Kimberly Boyd was informed we would discuss her lab results at her next visit unless there is a critical issue that needs to be addressed sooner. Kimberly Boyd agreed to keep her next visit at the agreed upon time to discuss these results.  Objective:   Blood pressure 106/71, pulse 64, temperature 98.3 F (36.8 C), height '5\' 7"'$  (1.702 m), weight 200  lb (90.7 kg), SpO2 99 %. Body mass index is 31.32 kg/m.  General: Cooperative, alert, well developed, in no acute distress. HEENT: Conjunctivae and lids unremarkable. Cardiovascular: Regular rhythm.  Lungs: Normal work of breathing. Neurologic: No focal deficits.   Lab Results  Component Value Date   CREATININE 0.71 11/18/2021   BUN 14 11/18/2021   NA 140 11/18/2021   K 3.7 11/18/2021   CL 100 11/18/2021   CO2  27 11/18/2021   Lab Results  Component Value Date   ALT 21 11/18/2021   AST 18 11/18/2021   ALKPHOS 70 11/18/2021   BILITOT 0.4 11/18/2021   Lab Results  Component Value Date   HGBA1C 6.0 (H) 11/18/2021   HGBA1C 6.2 (H) 08/05/2021   HGBA1C 6.0 (H) 02/12/2021   HGBA1C 5.9 (H) 11/24/2020   HGBA1C 6.2 (H) 05/12/2020   Lab Results  Component Value Date   INSULIN 6.6 11/18/2021   INSULIN 7.2 08/05/2021   INSULIN 7.4 11/24/2020   INSULIN 12.4 05/12/2020   Lab Results  Component Value Date   TSH 1.590 11/18/2021   Lab Results  Component Value Date   CHOL 207 (H) 11/18/2021   HDL 72 11/18/2021   LDLCALC 124 (H) 11/18/2021   TRIG 62 11/18/2021   Lab Results  Component Value Date   VD25OH 36.3 11/24/2020   VD25OH 40.8 05/12/2020   Lab Results  Component Value Date   WBC 4.0 11/18/2021   HGB 13.1 11/18/2021   HCT 39.2 11/18/2021   MCV 93 11/18/2021   PLT 294 11/18/2021   Lab Results  Component Value Date   FERRITIN 55 11/18/2021   Attestation Statements:   Reviewed by clinician on day of visit: allergies, medications, problem list, medical history, surgical history, family history, social history, and previous encounter notes.  I, Brendell Tyus, RMA, am acting as transcriptionist for Everardo Pacific, FNP.  I have reviewed the above documentation for accuracy and completeness, and I agree with the above. Everardo Pacific, FNP

## 2021-12-25 ENCOUNTER — Other Ambulatory Visit (HOSPITAL_COMMUNITY): Payer: Self-pay

## 2021-12-27 ENCOUNTER — Ambulatory Visit (INDEPENDENT_AMBULATORY_CARE_PROVIDER_SITE_OTHER): Payer: 59 | Admitting: Adult Health

## 2021-12-27 DIAGNOSIS — D225 Melanocytic nevi of trunk: Secondary | ICD-10-CM | POA: Diagnosis not present

## 2021-12-27 DIAGNOSIS — Z1283 Encounter for screening for malignant neoplasm of skin: Secondary | ICD-10-CM | POA: Diagnosis not present

## 2021-12-27 DIAGNOSIS — L908 Other atrophic disorders of skin: Secondary | ICD-10-CM | POA: Diagnosis not present

## 2022-01-01 ENCOUNTER — Other Ambulatory Visit (HOSPITAL_COMMUNITY): Payer: Self-pay

## 2022-01-03 ENCOUNTER — Other Ambulatory Visit (HOSPITAL_COMMUNITY): Payer: Self-pay

## 2022-01-03 MED ORDER — TRETINOIN 0.025 % EX CREA
TOPICAL_CREAM | CUTANEOUS | 99 refills | Status: DC
  Filled 2022-01-03: qty 45, 30d supply, fill #0

## 2022-01-05 ENCOUNTER — Other Ambulatory Visit (HOSPITAL_COMMUNITY): Payer: Self-pay

## 2022-01-10 ENCOUNTER — Ambulatory Visit (INDEPENDENT_AMBULATORY_CARE_PROVIDER_SITE_OTHER): Payer: 59 | Admitting: Adult Health

## 2022-02-16 DIAGNOSIS — M1711 Unilateral primary osteoarthritis, right knee: Secondary | ICD-10-CM | POA: Diagnosis not present

## 2022-02-17 DIAGNOSIS — N951 Menopausal and female climacteric states: Secondary | ICD-10-CM | POA: Diagnosis not present

## 2022-02-17 DIAGNOSIS — I10 Essential (primary) hypertension: Secondary | ICD-10-CM | POA: Diagnosis not present

## 2022-02-17 DIAGNOSIS — Z01419 Encounter for gynecological examination (general) (routine) without abnormal findings: Secondary | ICD-10-CM | POA: Diagnosis not present

## 2022-02-17 DIAGNOSIS — N952 Postmenopausal atrophic vaginitis: Secondary | ICD-10-CM | POA: Diagnosis not present

## 2022-02-22 ENCOUNTER — Other Ambulatory Visit (HOSPITAL_COMMUNITY): Payer: Self-pay

## 2022-04-07 ENCOUNTER — Other Ambulatory Visit: Payer: Self-pay

## 2022-04-13 DIAGNOSIS — I10 Essential (primary) hypertension: Secondary | ICD-10-CM | POA: Diagnosis not present

## 2022-04-13 DIAGNOSIS — Z6833 Body mass index (BMI) 33.0-33.9, adult: Secondary | ICD-10-CM | POA: Diagnosis not present

## 2022-04-13 DIAGNOSIS — M1711 Unilateral primary osteoarthritis, right knee: Secondary | ICD-10-CM | POA: Diagnosis not present

## 2022-04-13 DIAGNOSIS — N951 Menopausal and female climacteric states: Secondary | ICD-10-CM | POA: Diagnosis not present

## 2022-04-13 DIAGNOSIS — R7303 Prediabetes: Secondary | ICD-10-CM | POA: Diagnosis not present

## 2022-04-13 DIAGNOSIS — E6609 Other obesity due to excess calories: Secondary | ICD-10-CM | POA: Diagnosis not present

## 2022-04-13 DIAGNOSIS — R635 Abnormal weight gain: Secondary | ICD-10-CM | POA: Diagnosis not present

## 2022-04-13 DIAGNOSIS — M25561 Pain in right knee: Secondary | ICD-10-CM | POA: Diagnosis not present

## 2022-04-13 DIAGNOSIS — F419 Anxiety disorder, unspecified: Secondary | ICD-10-CM | POA: Diagnosis not present

## 2022-04-13 DIAGNOSIS — R002 Palpitations: Secondary | ICD-10-CM | POA: Diagnosis not present

## 2022-05-12 DIAGNOSIS — H5203 Hypermetropia, bilateral: Secondary | ICD-10-CM | POA: Diagnosis not present

## 2022-05-12 DIAGNOSIS — H52203 Unspecified astigmatism, bilateral: Secondary | ICD-10-CM | POA: Diagnosis not present

## 2022-05-18 ENCOUNTER — Other Ambulatory Visit: Payer: Self-pay | Admitting: Obstetrics and Gynecology

## 2022-05-18 DIAGNOSIS — Z1231 Encounter for screening mammogram for malignant neoplasm of breast: Secondary | ICD-10-CM

## 2022-05-20 ENCOUNTER — Other Ambulatory Visit (HOSPITAL_COMMUNITY): Payer: Self-pay

## 2022-05-20 DIAGNOSIS — G4700 Insomnia, unspecified: Secondary | ICD-10-CM | POA: Diagnosis not present

## 2022-05-20 DIAGNOSIS — N951 Menopausal and female climacteric states: Secondary | ICD-10-CM | POA: Diagnosis not present

## 2022-05-20 MED ORDER — GABAPENTIN 100 MG PO CAPS
100.0000 mg | ORAL_CAPSULE | Freq: Every day | ORAL | 1 refills | Status: DC
  Filled 2022-05-20: qty 30, 30d supply, fill #0

## 2022-05-27 ENCOUNTER — Other Ambulatory Visit (HOSPITAL_COMMUNITY): Payer: Self-pay

## 2022-06-20 ENCOUNTER — Telehealth: Payer: Commercial Managed Care - PPO | Admitting: Physician Assistant

## 2022-06-20 ENCOUNTER — Other Ambulatory Visit (HOSPITAL_COMMUNITY): Payer: Self-pay

## 2022-06-20 DIAGNOSIS — M109 Gout, unspecified: Secondary | ICD-10-CM | POA: Diagnosis not present

## 2022-06-20 MED ORDER — COLCHICINE 0.6 MG PO TABS
ORAL_TABLET | ORAL | 0 refills | Status: DC
  Filled 2022-06-20: qty 15, 7d supply, fill #0

## 2022-06-20 NOTE — Progress Notes (Signed)
Virtual Visit Consent   Kimberly Boyd, you are scheduled for a virtual visit with a [facility] provider today. Just as with appointments in the office, your consent must be obtained to participate. Your consent will be active for this visit and any virtual visit you may have with one of our providers in the next 365 days. If you have a MyChart account, a copy of this consent can be sent to you electronically.  As this is a virtual visit, video technology does not allow for your provider to perform a traditional examination. This may limit your provider's ability to fully assess your condition. If your provider identifies any concerns that need to be evaluated in person or the need to arrange testing (such as labs, EKG, etc.), we will make arrangements to do so. Although advances in technology are sophisticated, we cannot ensure that it will always work on either your end or our end. If the connection with a video visit is poor, the visit may have to be switched to a telephone visit. With either a video or telephone visit, we are not always able to ensure that we have a secure connection.  By engaging in this virtual visit, you consent to the provision of healthcare and authorize for your insurance to be billed (if applicable) for the services provided during this visit. Depending on your insurance coverage, you may receive a charge related to this service.  I need to obtain your verbal consent now. Are you willing to proceed with your visit today? Kimberly Boyd has provided verbal consent on 06/20/2022 for a virtual visit (video or telephone). Piedad ClimesWilliam Cody Markez [place], [place]PA-C  Date: 06/20/2022 2:12 PM  Virtual Visit via Video Note   I, Piedad ClimesWilliam Cody Gracielynn Birkel, connected with  Kimberly Boyd  ([id number], 1965/08/27) on 06/20/22 at  1:45 PM EDT by a video-enabled telemedicine application and verified that I am speaking with the correct person using two identifiers.  Location: Patient: Virtual Visit Location  Patient: Home Provider: Virtual Visit Location Provider: Home Office   I discussed the limitations of evaluation and management by telemedicine and the availability of in person appointments. The patient expressed understanding and agreed to proceed.    History of Present Illness: Kimberly Boyd is a 57 y.o. who identifies as a female who was assigned female at birth, and is being seen today for concern of gout in R great toe. Symptoms starting a few days ago with pain and tenderness, warmth to the 1st MTP. Has history for gout but thankfully not for quite some time. Took an herbal medicine starting a week ago that is high in niacin. Denies substantial alcohol consumption.    HPI: HPI  Problems:  Patient Active Problem List   Diagnosis Date Noted   Primary osteoarthritis of right knee 02/23/2021   Other hyperlipidemia 12/22/2020   Prediabetes 05/13/2020   Arthritis of right knee 05/13/2020   Asymptomatic varicose veins of bilateral lower extremities 05/13/2020   Dysplastic nevus 05/13/2020   Elevated erythrocyte sedimentation rate 05/13/2020   Gout 05/13/2020   Gouty arthropathy 05/13/2020   Hot flashes 05/13/2020   Hyperglycemia 05/13/2020   Insomnia 05/13/2020   Menopause 05/13/2020   Iron deficiency anemia due to chronic blood loss 05/13/2020   Pica 05/13/2020   Pure hypercholesterolemia 05/13/2020   Right knee pain 05/13/2020   Sacroiliitis, not elsewhere classified 05/13/2020   Vitamin D insufficiency 05/13/2020   Class 1 obesity with serious comorbidity and body mass index (BMI) of  33.0 to 33.9 in adult    Essential hypertension 08/03/2017   Osteoarthritis of right knee 05/20/2015   Fibroid, uterine    Fever 09/30/2010   Supraventricular tachycardia-probable concealed accessory pathway 08/31/2010   Stress 08/31/2010   ALLERGIC RHINITIS 11/20/2008   GERD 11/20/2008   PALPITATIONS 11/20/2008   CHEST PAIN 11/20/2008    Allergies:  Allergies  Allergen Reactions    Doxycycline Other (See Comments)    Esophagitis; opened up in my esophagus   Medications:  Current Outpatient Medications:    colchicine 0.6 MG tablet, Take 2 tablets immediately, then 1 tablet twice daily for up to max of 7 days, Disp: 15 tablet, Rfl: 0   ALPRAZolam (XANAX) 0.5 MG tablet, Take 0.5 mg by mouth daily as needed for anxiety., Disp: , Rfl:    Biotin 2500 MCG CAPS, Take 2,500 mcg by mouth daily. Gummy (Patient not taking: Reported on 11/18/2021), Disp: , Rfl:    Cholecalciferol (VITAMIN D3) 50 MCG (2000 UT) TABS, Take 4,000 Units by mouth in the morning., Disp: , Rfl:    Cholecalciferol 100 MCG (4000 UT) CAPS, Take 1 capsule (4,000 Units total) by mouth daily., Disp: 30 capsule, Rfl:    ELDERBERRY PO, Take 1 tablet by mouth 3 (three) times a week., Disp: , Rfl:    gabapentin (NEURONTIN) 100 MG capsule, Take 1 capsule (100 mg total) by mouth at bedtime., Disp: 30 capsule, Rfl: 1   Magnesium Oxide 400 MG CAPS, Take 1 capsule (400 mg total) by mouth daily. (Patient not taking: Reported on 11/18/2021), Disp: 90 capsule, Rfl: 3   metFORMIN (GLUCOPHAGE) 500 MG tablet, Take 1 tablet (500 mg total) by mouth daily with breakfast., Disp: 30 tablet, Rfl: 0   omeprazole (PRILOSEC) 20 MG capsule, Take 1 capsule by mouth once a day, Disp: 90 capsule, Rfl: 0   potassium chloride (KLOR-CON M) 10 MEQ tablet, Take 1 tablet (10 mEq total) by mouth every other day., Disp: 45 tablet, Rfl: 3   potassium chloride (KLOR-CON M) 10 MEQ tablet, Take 1 tablet by mouth with food once a day., Disp: 90 tablet, Rfl: 3   Probiotic Product (PROBIOTIC PO), Take 1 capsule by mouth daily., Disp: , Rfl:    propranolol ER (INDERAL LA) 120 MG 24 hr capsule, Take 1 capsule (120 mg total) by mouth daily., Disp: 90 capsule, Rfl: 3   tretinoin (RETIN-A) 0.025 % cream, Apply to affected areas on face at bedtime, Disp: 45 g, Rfl: PRN   triamterene-hydrochlorothiazide (MAXZIDE-25) 37.5-25 MG tablet, Take 1 tablet by mouth in the  morning daily, Disp: 90 tablet, Rfl: 1   triamterene-hydrochlorothiazide (MAXZIDE-25) 37.5-25 MG tablet, Take 1 tablet by mouth every morning., Disp: 90 tablet, Rfl: 3   vitamin C (ASCORBIC ACID) 500 MG tablet, Take 1,000 mg by mouth daily., Disp: , Rfl:    zinc sulfate 220 (50 Zn) MG capsule, Take 220 mg by mouth 3 (three) times a week., Disp: , Rfl:   Observations/Objective: Patient is well-developed, well-nourished in no acute distress.  Resting comfortably at home.  Head is normocephalic, atraumatic.  No labored breathing. Speech is clear and coherent with logical content.  Patient is alert and oriented at baseline.   Assessment and Plan: 1. Podagra - colchicine 0.6 MG tablet; Take 2 tablets immediately, then 1 tablet twice daily for up to max of 7 days  Dispense: 15 tablet; Refill: 0  Classic symptoms. Risk factors -- Niacin-derivatives, decreased hydration, thiazide diuretic use. Will start treatment with colchicine as  directed and tart cherry extract. Avoidance of triggers and increased hydration. Follow-up with PCP for ongoing management and assessment of general uric acid levels.   Follow Up Instructions: I discussed the assessment and treatment plan with the patient. The patient was provided an opportunity to ask questions and all were answered. The patient agreed with the plan and demonstrated an understanding of the instructions.  A copy of instructions were sent to the patient via MyChart unless otherwise noted below.   The patient was advised to call back or seek an in-person evaluation if the symptoms worsen or if the condition fails to improve as anticipated.  Time:  I spent 10 minutes with the patient via telehealth technology discussing the above problems/concerns.    Piedad Climes, PA-C

## 2022-06-20 NOTE — Patient Instructions (Signed)
Gout  Gout is painful swelling of your joints. Gout is a type of arthritis. It is caused by having too much uric acid in your body. Uric acid is a chemical that is made when your body breaks down substances called purines. If your body has too much uric acid, sharp crystals can form and build up in your joints. This causes pain and swelling. Gout attacks can happen quickly and be very painful (acute gout). Over time, the attacks can affect more joints and happen more often (chronic gout). What are the causes? Gout is caused by too much uric acid in your blood. This can happen because: Your kidneys do not remove enough uric acid from your blood. Your body makes too much uric acid. You eat too many foods that are high in purines. These foods include organ meats, some seafood, and beer. Trauma or stress can bring on an attack. What increases the risk? Having a family history of gout. Being female and middle-aged. Being female and having gone through menopause. Having an organ transplant. Taking certain medicines. Having certain conditions, such as: Being very overweight (obese). Lead poisoning. Kidney disease. A skin condition called psoriasis. Other risks include: Losing weight too quickly. Not having enough water in the body (being dehydrated). Drinking alcohol, especially beer. Drinking beverages that are sweetened with a type of sugar called fructose. What are the signs or symptoms? An attack of acute gout often starts at night and usually happens in just one joint. The most common place is the big toe. Other joints that may be affected include joints of the feet, ankle, knee, fingers, wrist, or elbow. Symptoms may include: Very bad pain. Warmth. Swelling. Stiffness. Tenderness. The affected joint may be very painful to touch. Shiny, red, or purple skin. Chills and fever. Chronic gout may cause symptoms more often. More joints may be involved. You may also have white or yellow lumps  (tophi) on your hands or feet or in other areas near your joints. How is this treated? Treatment for an acute attack may include medicines for pain and swelling, such as: NSAIDs, such as ibuprofen. Steroids taken by mouth or injected into a joint. Colchicine. This can be given by mouth or through an IV tube. Treatment to prevent future attacks may include: Taking small doses of NSAIDs or colchicine daily. Using a medicine that reduces uric acid levels in your blood, such as allopurinol. Making changes to your diet. You may need to see a food expert (dietitian) about what to eat and drink to prevent gout. Follow these instructions at home: During a gout attack  If told, put ice on the painful area. To do this: Put ice in a plastic bag. Place a towel between your skin and the bag. Leave the ice on for 20 minutes, 2-3 times a day. Take off the ice if your skin turns bright red. This is very important. If you cannot feel pain, heat, or cold, you have a greater risk of damage to the area. Raise the painful joint above the level of your heart as often as you can. Rest the joint as much as possible. If the joint is in your leg, you may be given crutches. Follow instructions from your doctor about what you cannot eat or drink. Avoiding future gout attacks Eat a low-purine diet. Avoid foods and drinks such as: Liver. Kidney. Anchovies. Asparagus. Herring. Mushrooms. Mussels. Beer. Stay at a healthy weight. If you want to lose weight, talk with your doctor. Do not   lose weight too fast. Start or continue an exercise plan as told by your doctor. Eating and drinking Avoid drinks sweetened by fructose. Drink enough fluids to keep your pee (urine) pale yellow. If you drink alcohol: Limit how much you have to: 0-1 drink a day for women who are not pregnant. 0-2 drinks a day for men. Know how much alcohol is in a drink. In the [place] one drink equals one 12 oz bottle of beer (355 mL), one 5 oz  glass of wine (148 mL), or one 1 oz glass of hard liquor (44 mL). General instructions Take over-the-counter and prescription medicines only as told by your doctor. Ask your doctor if you should avoid driving or using machines while you are taking your medicine. Return to your normal activities when your doctor says that it is safe. Keep all follow-up visits. Where to find more information National Institutes of Health: www.niams.nih.gov Contact a doctor if: You have another gout attack. You still have symptoms of a gout attack after 10 days of treatment. You have problems (side effects) because of your medicines. You have chills or a fever. You have burning pain when you pee (urinate). You have pain in your lower back or belly. Get help right away if: You have very bad pain. Your pain cannot be controlled. You cannot pee. Summary Gout is painful swelling of the joints. The most common site of pain is the big toe, but it can affect other joints. Medicines and avoiding some foods can help to prevent and treat gout attacks. This information is not intended to replace advice given to you by your health care provider. Make sure you discuss any questions you have with your health care provider. Document Revised: 12/02/2020 Document Reviewed: 12/02/2020 Elsevier Patient Education  2023 [employer] Inc.  

## 2022-07-06 ENCOUNTER — Ambulatory Visit
Admission: RE | Admit: 2022-07-06 | Discharge: 2022-07-06 | Disposition: A | Discharge status: Home / Self Care | Payer: Commercial Managed Care - PPO | Source: Ambulatory Visit | Attending: Obstetrics and Gynecology | Admitting: Obstetrics and Gynecology

## 2022-07-06 DIAGNOSIS — Z1231 Encounter for screening mammogram for malignant neoplasm of breast: Secondary | ICD-10-CM | POA: Diagnosis not present

## 2022-07-26 ENCOUNTER — Other Ambulatory Visit (HOSPITAL_COMMUNITY): Payer: Self-pay

## 2022-07-27 ENCOUNTER — Other Ambulatory Visit (HOSPITAL_COMMUNITY): Payer: Self-pay

## 2022-07-27 DIAGNOSIS — R7303 Prediabetes: Secondary | ICD-10-CM | POA: Diagnosis not present

## 2022-07-27 DIAGNOSIS — R1012 Left upper quadrant pain: Secondary | ICD-10-CM | POA: Diagnosis not present

## 2022-07-27 DIAGNOSIS — Z6835 Body mass index (BMI) 35.0-35.9, adult: Secondary | ICD-10-CM | POA: Diagnosis not present

## 2022-07-27 MED ORDER — LINZESS 72 MCG PO CAPS
72.0000 ug | ORAL_CAPSULE | Freq: Every day | ORAL | 0 refills | Status: AC
Start: 2022-07-27 — End: ?
  Filled 2022-07-27: qty 30, 30d supply, fill #0

## 2022-08-03 DIAGNOSIS — N951 Menopausal and female climacteric states: Secondary | ICD-10-CM | POA: Diagnosis not present

## 2022-08-13 ENCOUNTER — Other Ambulatory Visit (HOSPITAL_COMMUNITY): Payer: Self-pay

## 2022-08-13 MED ORDER — WEGOVY 0.25 MG/0.5ML ~~LOC~~ SOAJ
0.2500 mg | SUBCUTANEOUS | 1 refills | Status: DC
  Filled 2022-08-13 (×2): qty 2, 28d supply, fill #0

## 2022-08-15 ENCOUNTER — Other Ambulatory Visit (HOSPITAL_COMMUNITY): Payer: Self-pay

## 2022-08-15 ENCOUNTER — Encounter: Payer: Self-pay | Admitting: Pain Medicine

## 2022-08-18 ENCOUNTER — Other Ambulatory Visit (HOSPITAL_COMMUNITY): Payer: Self-pay

## 2022-08-19 ENCOUNTER — Other Ambulatory Visit: Payer: Self-pay | Admitting: Pain Medicine

## 2022-08-19 DIAGNOSIS — R1012 Left upper quadrant pain: Secondary | ICD-10-CM

## 2022-08-29 ENCOUNTER — Other Ambulatory Visit (HOSPITAL_COMMUNITY): Payer: Self-pay

## 2022-08-31 DIAGNOSIS — K5909 Other constipation: Secondary | ICD-10-CM | POA: Diagnosis not present

## 2022-08-31 DIAGNOSIS — I10 Essential (primary) hypertension: Secondary | ICD-10-CM | POA: Diagnosis not present

## 2022-08-31 DIAGNOSIS — R1012 Left upper quadrant pain: Secondary | ICD-10-CM | POA: Diagnosis not present

## 2022-08-31 DIAGNOSIS — R7303 Prediabetes: Secondary | ICD-10-CM | POA: Diagnosis not present

## 2022-08-31 DIAGNOSIS — Z6835 Body mass index (BMI) 35.0-35.9, adult: Secondary | ICD-10-CM | POA: Diagnosis not present

## 2022-09-01 ENCOUNTER — Ambulatory Visit
Admission: RE | Admit: 2022-09-01 | Discharge: 2022-09-01 | Disposition: A | Discharge status: Home / Self Care | Payer: Commercial Managed Care - PPO | Source: Ambulatory Visit | Attending: Pain Medicine | Admitting: Pain Medicine

## 2022-09-01 DIAGNOSIS — E279 Disorder of adrenal gland, unspecified: Secondary | ICD-10-CM | POA: Diagnosis not present

## 2022-09-01 DIAGNOSIS — R1012 Left upper quadrant pain: Secondary | ICD-10-CM

## 2022-09-01 DIAGNOSIS — D259 Leiomyoma of uterus, unspecified: Secondary | ICD-10-CM | POA: Diagnosis not present

## 2022-09-01 MED ORDER — IOPAMIDOL (ISOVUE-300) INJECTION 61%
100.0000 mL | Freq: Once | INTRAVENOUS | Status: AC | PRN
  Administered 2022-09-01: 100 mL via INTRAVENOUS

## 2022-09-20 ENCOUNTER — Other Ambulatory Visit (HOSPITAL_COMMUNITY): Payer: Self-pay

## 2022-09-25 ENCOUNTER — Ambulatory Visit
Admission: EM | Admit: 2022-09-25 | Discharge: 2022-09-25 | Disposition: A | Discharge status: Home / Self Care | Payer: Commercial Managed Care - PPO | Attending: Internal Medicine | Admitting: Internal Medicine

## 2022-09-25 ENCOUNTER — Ambulatory Visit (INDEPENDENT_AMBULATORY_CARE_PROVIDER_SITE_OTHER): Payer: Commercial Managed Care - PPO

## 2022-09-25 DIAGNOSIS — M25511 Pain in right shoulder: Secondary | ICD-10-CM | POA: Diagnosis not present

## 2022-09-25 DIAGNOSIS — S46911A Strain of unspecified muscle, fascia and tendon at shoulder and upper arm level, right arm, initial encounter: Secondary | ICD-10-CM | POA: Diagnosis not present

## 2022-09-25 MED ORDER — CYCLOBENZAPRINE HCL 10 MG PO TABS
10.0000 mg | ORAL_TABLET | Freq: Every day | ORAL | 0 refills | Status: AC
Start: 2022-09-25 — End: 2022-09-30

## 2022-09-25 MED ORDER — CYCLOBENZAPRINE HCL 10 MG PO TABS
10.0000 mg | ORAL_TABLET | Freq: Two times a day (BID) | ORAL | 0 refills | Status: DC | PRN
Start: 2022-09-25 — End: 2022-09-25

## 2022-09-25 MED ORDER — PREDNISONE 10 MG (21) PO TBPK
ORAL_TABLET | Freq: Every day | ORAL | 0 refills | Status: DC
Start: 2022-09-25 — End: 2022-10-26

## 2022-09-25 NOTE — ED Triage Notes (Signed)
Pt presents to UC w/ c/o right shoulder pain radiating to arm. Pt states she leaned on it while getting up about 2 days ago. Advil helps some. Painful with movement.

## 2022-09-25 NOTE — Discharge Instructions (Addendum)
Start prednisone taper as prescribed.  You may take Flexeril at night as needed.  Please of this medication will make you drowsy.  Do not drink alcohol or drive on this medication.  Heat to the shoulder as needed and rest.  Please follow-up with your PCP if your symptoms do not improve.  Please go to the ER for any worsening symptoms.  I hope you feel better soon!

## 2022-09-25 NOTE — ED Provider Notes (Signed)
[facility]    CSN: [id number] Arrival date & time: 09/25/22  1148      History   Chief Complaint Chief Complaint  Patient presents with   Shoulder Pain    HPI Kimberly Boyd is a 57 y.o. female presents for evaluation of shoulder pain.  Patient reports 2 nights ago she went to roll over in bed and used her over-the-counter prop herself up and felt a sharp pain in her right lateral shoulder that has been persistent since that time.  It is nonradiating.  It is worse with movement of the shoulder.  Denies any swelling or bruising.  No numbness or tingling.  No history of right shoulder injuries/surgeries/fractures in the past.  She been taking ibuprofen and using a topical CBD cream with minimal improvement.  No other concerns at this time.   Shoulder Pain   Past Medical History:  Diagnosis Date   Anemia    Anxiety    Arthritis    Arthritis of left foot    Arthritis of right knee    Back pain years.    chronic low back pain    Chronic back pain    Complication of anesthesia    tachycardia, difficulty waking up   GERD (gastroesophageal reflux disease)    Heart murmur    questionable   Hypertension    Obesity    Palpitations    Pre-diabetes    SVT (supraventricular tachycardia)     Patient Active Problem List   Diagnosis Date Noted   Primary osteoarthritis of right knee 02/23/2021   Other hyperlipidemia 12/22/2020   Prediabetes 05/13/2020   Arthritis of right knee 05/13/2020   Asymptomatic varicose veins of bilateral lower extremities 05/13/2020   Dysplastic nevus 05/13/2020   Elevated erythrocyte sedimentation rate 05/13/2020   Gout 05/13/2020   Gouty arthropathy 05/13/2020   Hot flashes 05/13/2020   Hyperglycemia 05/13/2020   Insomnia 05/13/2020   Menopause 05/13/2020   Iron deficiency anemia due to chronic blood loss 05/13/2020   Pica 05/13/2020   Pure hypercholesterolemia 05/13/2020   Right knee pain 05/13/2020   Sacroiliitis, not elsewhere  classified (HCC) 05/13/2020   Vitamin D insufficiency 05/13/2020   Class 1 obesity with serious comorbidity and body mass index (BMI) of 33.0 to 33.9 in adult    Essential hypertension 08/03/2017   Osteoarthritis of right knee 05/20/2015   Fibroid, uterine    Fever 09/30/2010   Supraventricular tachycardia-probable concealed accessory pathway 08/31/2010   Stress 08/31/2010   ALLERGIC RHINITIS 11/20/2008   GERD 11/20/2008   PALPITATIONS 11/20/2008   CHEST PAIN 11/20/2008    Past Surgical History:  Procedure Laterality Date   BREAST BIOPSY Right    Benign approx age 65   cervical dysplasia laser     dermoid cyst removal     IR GENERIC HISTORICAL  06/04/2014   IR RADIOLOGIST EVAL & MGMT 06/04/2014 Simonne Come, MD [facility] INTERV RAD   LAPAROSCOPIC CHOLECYSTECTOMY     LIPOMA RESECTION     and redone   ORIF ANKLE FRACTURE  08/09/2011   Procedure: OPEN REDUCTION INTERNAL FIXATION (ORIF) ANKLE FRACTURE;  Surgeon: Toni Arthurs, MD;  Location: [facility] OR;  Service: Orthopedics;  Laterality: Right;  ORIF Right Ankle Lateral Malleolus   scar revision for the lipoma     TOTAL KNEE ARTHROPLASTY Right 02/23/2021   Procedure: RIGHT TOTAL KNEE ARTHROPLASTY;  Surgeon: Marcene Corning, MD;  Location: [facility] ORS;  Service: Orthopedics;  Laterality: Right;   WISDOM TOOTH  EXTRACTION      OB History     Gravida  2   Para  2   Term      Preterm      AB      Living         SAB      IAB      Ectopic      Multiple      Live Births               Home Medications    Prior to Admission medications   Medication Sig Start Date End Date Taking? Authorizing Provider  predniSONE (STERAPRED UNI-PAK 21 TAB) 10 MG (21) TBPK tablet Take by mouth daily. Take 6 tabs by mouth daily  for 1 day, then 5 tabs for 1 day, then 4 tabs for 1 day, then 3 tabs for 1 day, 2 tabs for 1 day, then 1 tab by mouth daily for 1 days 09/25/22  Yes Radford Pax, NP  ALPRAZolam Prudy Feeler) 0.5 MG tablet Take 0.5 mg by mouth  daily as needed for anxiety.    [provider]  Biotin 2500 MCG CAPS Take 2,500 mcg by mouth daily. Gummy Patient not taking: Reported on 11/18/2021    [provider]  Cholecalciferol (VITAMIN D3) 50 MCG (2000 UT) TABS Take 4,000 Units by mouth in the morning.    [provider]  Cholecalciferol 100 MCG (4000 UT) CAPS Take 1 capsule (4,000 Units total) by mouth daily. 12/22/20   [place]mire, Thermon Leyland, FNP  colchicine 0.6 MG tablet Take 2 tablets immediately, then 1 tablet twice daily for up to max of 7 days 06/20/22   Waldon Merl, PA-C  cyclobenzaprine (FLEXERIL) 10 MG tablet Take 1 tablet (10 mg total) by mouth at bedtime for 5 days. 09/25/22 09/30/22  Radford Pax, NP  ELDERBERRY PO Take 1 tablet by mouth 3 (three) times a week.    [provider]  gabapentin (NEURONTIN) 100 MG capsule Take 1 capsule (100 mg total) by mouth at bedtime. 05/20/22     linaclotide (LINZESS) 72 MCG capsule Take 1 capsule (72 mcg total) by mouth daily at least 30 minutes before the first meal of the day on an empty stomach 07/27/22     Magnesium Oxide 400 MG CAPS Take 1 capsule (400 mg total) by mouth daily. Patient not taking: Reported on 11/18/2021 04/29/21   Duke Salvia, MD  metFORMIN (GLUCOPHAGE) 500 MG tablet Take 1 tablet (500 mg total) by mouth daily with breakfast. 11/18/21   Irene Limbo, FNP  omeprazole (PRILOSEC) 20 MG capsule Take 1 capsule by mouth once a day 02/19/21     potassium chloride (KLOR-CON M) 10 MEQ tablet Take 1 tablet (10 mEq total) by mouth every other day. 06/07/21   Duke Salvia, MD  potassium chloride (KLOR-CON M) 10 MEQ tablet Take 1 tablet by mouth with food once a day. 10/08/21     Probiotic Product (PROBIOTIC PO) Take 1 capsule by mouth daily.    [provider]  propranolol ER (INDERAL LA) 120 MG 24 hr capsule Take 1 capsule (120 mg total) by mouth daily. 10/08/21     Semaglutide-Weight Management (WEGOVY) 0.25 MG/0.5ML SOAJ Inject 0.25  mg into the skin once a week. 08/12/22     tretinoin (RETIN-A) 0.025 % cream Apply to affected areas on face at bedtime 12/27/21     triamterene-hydrochlorothiazide (MAXZIDE-25) 37.5-25 MG tablet Take 1 tablet by mouth  in the morning daily 03/17/21     triamterene-hydrochlorothiazide (MAXZIDE-25) 37.5-25 MG tablet Take 1 tablet by mouth every morning. 10/08/21     vitamin C (ASCORBIC ACID) 500 MG tablet Take 1,000 mg by mouth daily.    [provider]  zinc sulfate 220 (50 Zn) MG capsule Take 220 mg by mouth 3 (three) times a week.    [provider]    Family History Family History  Problem Relation Age of Onset   High blood pressure Mother    Cancer Mother    High blood pressure Father    High Cholesterol Father    Stroke Father    Kidney disease Father    Cancer Father     Social History Social History   Tobacco Use   Smoking status: Never    Passive exposure: Never   Smokeless tobacco: Never  Vaping Use   Vaping status: Never Used  Substance Use Topics   Alcohol use: Yes    Comment: rare wine/mixed drink   Drug use: No     Allergies   Doxycycline   Review of Systems Review of Systems  Musculoskeletal:        Right shoulder pain     Physical Exam Triage Vital Signs ED Triage Vitals [09/25/22 1321]  Encounter Vitals Group     BP 133/85     Systolic BP Percentile      Diastolic BP Percentile      Pulse Rate 60     Resp 16     Temp 98.2 F (36.8 C)     Temp Source Oral     SpO2 98 %     Weight      Height      Head Circumference      Peak Flow      Pain Score      Pain Loc      Pain Education      Exclude from Growth Chart    No data found.  Updated Vital Signs BP 133/85 (BP Location: Left Arm)   Pulse 60   Temp 98.2 F (36.8 C) (Oral)   Resp 16   SpO2 98%   Visual Acuity Right Eye Distance:   Left Eye Distance:   Bilateral Distance:    Right Eye Near:   Left Eye Near:    Bilateral Near:     Physical Exam Vitals  and nursing note reviewed.  Constitutional:      General: She is not in acute distress.    Appearance: Normal appearance. She is not ill-appearing.  HENT:     Head: Normocephalic and atraumatic.  Eyes:     Pupils: Pupils are equal, round, and reactive to light.  Cardiovascular:     Rate and Rhythm: Normal rate.  Pulmonary:     Effort: Pulmonary effort is normal.  Musculoskeletal:     Right shoulder: Tenderness present. No swelling, deformity, effusion, laceration, bony tenderness or crepitus. Decreased range of motion. Normal strength. Normal pulse.       Arms:  Skin:    General: Skin is warm and dry.  Neurological:     General: No focal deficit present.     Mental Status: She is alert and oriented to person, place, and time.  Psychiatric:        Mood and Affect: Mood normal.        Behavior: Behavior normal.      UC Treatments / Results  Labs (all labs ordered  are listed, but only abnormal results are displayed) Labs Reviewed - No data to display  EKG   Radiology DG Shoulder Right  Result Date: 09/25/2022 CLINICAL DATA:  Pain for 2 days. EXAM: RIGHT SHOULDER - 2+ VIEW COMPARISON:  None Available. FINDINGS: There is no evidence of fracture or dislocation. There is no evidence of arthropathy or other focal bone abnormality. Soft tissues are unremarkable. IMPRESSION: Negative. Electronically Signed   By: Ted Mcalpine M.D.   On: 09/25/2022 14:13    Procedures Procedures (including critical care time)  Medications Ordered in UC Medications - No data to display  Initial Impression / Assessment and Plan / UC Course  I have reviewed the triage vital signs and the nursing notes.  Pertinent labs & imaging results that were available during my care of the patient were reviewed by me and considered in my medical decision making (see chart for details).     Reviewed symptoms and exam with patient.  No red flags.  X-ray negative for fracture.  Discussed shoulder  strain.  Prednisone taper and Flexeril as needed at night.  Side effect profile reviewed.  Heat and rest.  PCP follow-up if symptoms do not improve.  ER precautions reviewed and patient verbalized understanding. Final Clinical Impressions(s) / UC Diagnoses   Final diagnoses:  Acute pain of right shoulder  Strain of right shoulder, initial encounter     Discharge Instructions      Start prednisone taper as prescribed.  You may take Flexeril at night as needed.  Please of this medication will make you drowsy.  Do not drink alcohol or drive on this medication.  Heat to the shoulder as needed and rest.  Please follow-up with your PCP if your symptoms do not improve.  Please go to the ER for any worsening symptoms.  I hope you feel better soon!     ED Prescriptions     Medication Sig Dispense Auth. Provider   predniSONE (STERAPRED UNI-PAK 21 TAB) 10 MG (21) TBPK tablet Take by mouth daily. Take 6 tabs by mouth daily  for 1 day, then 5 tabs for 1 day, then 4 tabs for 1 day, then 3 tabs for 1 day, 2 tabs for 1 day, then 1 tab by mouth daily for 1 days 21 tablet Radford Pax, NP   cyclobenzaprine (FLEXERIL) 10 MG tablet  (Status: Discontinued) Take 1 tablet (10 mg total) by mouth 3 times/day as needed-between meals & bedtime for muscle spasms. 5 tablet Radford Pax, NP   cyclobenzaprine (FLEXERIL) 10 MG tablet Take 1 tablet (10 mg total) by mouth at bedtime for 5 days. 5 tablet Radford Pax, NP      PDMP not reviewed this encounter.   Radford Pax, NP 09/25/22 1422

## 2022-09-27 DIAGNOSIS — Z0289 Encounter for other administrative examinations: Secondary | ICD-10-CM

## 2022-10-04 ENCOUNTER — Other Ambulatory Visit (HOSPITAL_COMMUNITY): Payer: Self-pay

## 2022-10-25 NOTE — Progress Notes (Signed)
.smr  Office: [phone]  /  Fax: [phone]  WEIGHT SUMMARY AND BIOMETRICS  Vitals Temp: 98.1 F (36.7 C) BP: 131/80 Pulse Rate: 64 SpO2: 94 %   Anthropometric Measurements Height: 5\' 7"  (1.702 m) Weight: 217 lb (98.4 kg) BMI (Calculated): 33.98 Weight at Last Visit: 200lb Weight Lost Since Last Visit: 0 Weight Gained Since Last Visit: 17lb Starting Weight: 213lb Total Weight Loss (lbs): 0 lb (0 kg) Peak Weight: 190lb   Body Composition  Body Fat %: 42.6 % Fat Mass (lbs): 92.6 lbs Muscle Mass (lbs): 118.4 lbs Total Body Water (lbs): 82.8 lbs Visceral Fat Rating : 11   Other Clinical Data RMR: 1440 Fasting: Yes Labs: yes Today's Visit #: 18 Starting Date: 05/12/21     HPI  Chief Complaint: OBESITY  Kimberly Boyd is here to discuss her progress with her obesity treatment plan. She is on the keeping a food journal and adhering to recommended goals of 1200-1300 calories and 75 grams of  protein and states she is following her eating plan approximately 0 % of the time. She states she is exercising walking/lifting weights 45-50 minutes 3-4 times per week.   Interval History:  LAST in office visit 11/18/2021 with Irene Limbo, FNP.   Since last office visit she is up 17 lbs.  She had gotten off her plan and is wanted to get back on track with her nutrition and exercise.   She has started working out over the past couple of months and started lifting weights over the past 2 weeks.  Bio impedence scale was reviewed with the patient:  Up 8.6 lbs in muscle mass  Up 7.4 lbs adipose mass  Up 7 lbs total body water  Stress- Some increased work stress. Has some good strategies in place to help manage .  Sleep- not consistently restorative. Awakens early, some night hot flashes. Exercise-Getting back on track with walking and strengthening program Hydration-Advised to increase water intake to at least 80 oz daily.    Repeated IC today:  REE is decreased to  1440 ( previous REE 1882) and lower than predicted. Lower by 442 calories.  Discussed changing to Category 1 nutrition plan to promote weight loss and continue to work on strengthening and getting in appropriate amount of protein to promote increase in REE.   Fasting labs obtained today.  She was informed we would discuss her lab results at her next visit unless there is a critical issue that needs to be addressed sooner. She agreed to keep her next visit at the agreed upon time to discuss these results.   Pharmacotherapy: Previously on metformin for insulin resistance/prediabetes. Some GI side effects at times but willing to try again and will resume at low dose initially.  Briefly on Wegovy in the past with good result- stopped as no insurance coverage.   TREATMENT PLAN FOR OBESITY:  Recommended Dietary Goals  Bethann is currently in the action stage of change. As such, her goal is to continue weight management plan. She has agreed to the Category 1 Plan.  Behavioral Intervention  We discussed the following Behavioral Modification Strategies today: increasing lean protein intake, decreasing simple carbohydrates , increasing vegetables, increasing lower glycemic fruits, increasing water intake, work on meal planning and preparation, work on managing stress, creating time for self-care and relaxation measures, continue to practice mindfulness when eating, and planning for success.  Additional resources provided today: NA  Recommended Physical Activity Goals  Jahaira has been advised to work up to 150  minutes of moderate intensity aerobic activity a week and strengthening exercises 2-3 times per week for cardiovascular health, weight loss maintenance and preservation of muscle mass.   She has agreed to Continue current level of physical activity    Pharmacotherapy We discussed various medication options to help Chaeli with her weight loss efforts and we both agreed to resume metformin for  primary indication of insulin resistance/prediabetes.   Return in about 4 weeks (around 11/23/2022).Marland Kitchen She was informed of the importance of frequent follow up visits to maximize her success with intensive lifestyle modifications for her multiple health conditions.  PHYSICAL EXAM:  Blood pressure 131/80, pulse 64, temperature 98.1 F (36.7 C), height 5\' 7"  (1.702 m), weight 217 lb (98.4 kg), SpO2 94%. Body mass index is 33.99 kg/m.  General: She is overweight, cooperative, alert, well developed, and in no acute distress. PSYCH: Has normal mood, affect and thought process.   Cardiovascular: HR 60's regular, BP 131/80 which is high for patient.  Lungs: Normal breathing effort, no conversational dyspnea. Neuro: no focal deficit  DIAGNOSTIC DATA REVIEWED:  BMET    Component Value Date/Time   NA 140 11/18/2021 0751   K 3.7 11/18/2021 0751   CL 100 11/18/2021 0751   CO2 27 11/18/2021 0751   GLUCOSE 99 11/18/2021 0751   GLUCOSE 141 (H) 02/20/2021 0107   BUN 14 11/18/2021 0751   CREATININE 0.71 11/18/2021 0751   CREATININE 0.61 08/01/2013 1414   CALCIUM 9.9 11/18/2021 0751   GFRNONAA >60 02/20/2021 0107   GFRNONAA >89 08/01/2013 1414   GFRAA >90 11/26/2013 0758   GFRAA >89 08/01/2013 1414   Lab Results  Component Value Date   HGBA1C 6.0 (H) 11/18/2021   HGBA1C 6.2 (H) 05/12/2020   Lab Results  Component Value Date   INSULIN 6.6 11/18/2021   INSULIN 12.4 05/12/2020   Lab Results  Component Value Date   TSH 1.590 11/18/2021   CBC    Component Value Date/Time   WBC 4.0 11/18/2021 0751   WBC 6.7 02/20/2021 0107   RBC 4.21 11/18/2021 0751   RBC 4.04 02/20/2021 0107   HGB 13.1 11/18/2021 0751   HCT 39.2 11/18/2021 0751   PLT 294 11/18/2021 0751   MCV 93 11/18/2021 0751   MCH 31.1 11/18/2021 0751   MCH 30.9 02/20/2021 0107   MCHC 33.4 11/18/2021 0751   MCHC 34.2 02/20/2021 0107   RDW 13.0 11/18/2021 0751   Iron Studies    Component Value Date/Time   FERRITIN 55  11/18/2021 0751   Lipid Panel     Component Value Date/Time   CHOL 207 (H) 11/18/2021 0751   TRIG 62 11/18/2021 0751   HDL 72 11/18/2021 0751   LDLCALC 124 (H) 11/18/2021 0751   Hepatic Function Panel     Component Value Date/Time   PROT 7.7 11/18/2021 0751   ALBUMIN 4.1 11/18/2021 0751   AST 18 11/18/2021 0751   ALT 21 11/18/2021 0751   ALKPHOS 70 11/18/2021 0751   BILITOT 0.4 11/18/2021 0751      Component Value Date/Time   TSH 1.590 11/18/2021 0751   Nutritional Lab Results  Component Value Date   VD25OH 36.3 11/24/2020   VD25OH 40.8 05/12/2020    ASSOCIATED CONDITIONS ADDRESSED TODAY  ASSESSMENT AND PLAN  Problem List Items Addressed This Visit     Essential hypertension   Prediabetes - Primary   Relevant Medications   metFORMIN (GLUCOPHAGE) 500 MG tablet   Other Relevant Orders   CMP14+EGFR  Hemoglobin A1c   Insulin, random   Vitamin D insufficiency   Relevant Orders   VITAMIN D 25 Hydroxy (Vit-D Deficiency, Fractures)   Other hyperlipidemia   Relevant Orders   Lipid Panel With LDL/HDL Ratio   Other Visit Diagnoses     Other fatigue       Relevant Orders   Vitamin B12   CBC with Differential/Platelet   TSH   Metabolic testing/ [facility]       Generalized obesity Start BMI 33.36 05/12/2020       Relevant Medications   metFORMIN (GLUCOPHAGE) 500 MG tablet   BMI 34.0-34.9,adult Current BMI 34.0         Prediabetes Last A1c was 6.0- not at goal/ Insulin 6.6- nearing goal  Medication(s): None Polyphagia:Yes She is getting back on track with her nutrition plan to decrease simple carbohydrates, increase lean proteins and exercise to promote weight loss, improve glycemic control and prevent progression to Type 2 diabetes.   Lab Results  Component Value Date   HGBA1C 6.0 (H) 11/18/2021   HGBA1C 6.2 (H) 08/05/2021   HGBA1C 6.0 (H) 02/12/2021   HGBA1C 5.9 (H) 11/24/2020   HGBA1C 6.2 (H) 05/12/2020   Lab Results  Component Value Date   INSULIN 6.6  11/18/2021   INSULIN 7.2 08/05/2021   INSULIN 7.4 11/24/2020   INSULIN 12.4 05/12/2020    Plan: Start Metformin 500 mg once daily breakfast Continue working on nutrition plan to decrease simple carbohydrates, increase lean proteins and exercise to promote weight loss, improve glycemic control and prevent progression to Type 2 diabetes.  Recheck fasting labs today.   Hypertension Hypertension asymptomatic, reasonably well controlled, and no significant medication side effects noted.  Medication(s): Maxzide 37.5-25 mg daily   Propranolol 120 mg daily Renal function normal.   BP Readings from Last 3 Encounters:  10/26/22 131/80  09/25/22 133/85  11/18/21 106/71   Lab Results  Component Value Date   CREATININE 0.71 11/18/2021   CREATININE 0.80 08/05/2021   CREATININE 0.74 04/05/2021   No results found for: "GFR"  Plan: Continue Maxzide and propranolol.  Continue to work on nutrition plan to promote weight loss and improve BP control.  Recheck labs today.   Vitamin D Deficiency Vitamin D is not at goal of 50.  Most recent vitamin D level was 36.3. She is on OTC vitamin D.  Lab Results  Component Value Date   VD25OH 36.3 11/24/2020   VD25OH 40.8 05/12/2020    Plan: Continue OTC vitamin D and confirm dosing at next visit.  Low vitamin D levels can be associated with adiposity and may result in leptin resistance and weight gain. Also associated with fatigue. Currently on vitamin D supplementation without any adverse effects.  Recheck vitamin D level today.   Hyperlipidemia LDL is not at goal. Medication(s): None Cardiovascular risk factors: dyslipidemia, hypertension, and obesity (BMI >= 30 kg/m2)  Lab Results  Component Value Date   CHOL 207 (H) 11/18/2021   HDL 72 11/18/2021   LDLCALC 124 (H) 11/18/2021   TRIG 62 11/18/2021   Lab Results  Component Value Date   ALT 21 11/18/2021   AST 18 11/18/2021   ALKPHOS 70 11/18/2021   BILITOT 0.4 11/18/2021   The  10-year ASCVD risk score (Arnett DK, et al., 2019) is: 4.6%   Values used to calculate the score:     Age: 38 years     Sex: Female     Is Non-Hispanic African American: Yes  Diabetic: No     Tobacco smoker: No     Systolic Blood Pressure: 131 mmHg     Is BP treated: Yes     HDL Cholesterol: 72 mg/dL     Total Cholesterol: 207 mg/dL  Plan:  Continue to work on nutrition plan -decreasing simple carbohydrates, increasing lean proteins, decreasing saturated fats and cholesterol , avoiding trans fats and exercise as able to promote weight loss, improve lipids and decrease cardiovascular risks. Recheck lipid panel today. May be candidate for statin therapy.   Fatigue:  Endorses fatigue.  Plan: Recheck CBC, B12, vitamin D and TSH today.   Metabolic testing/[facility]: Indirect calorimetry was repeated today. REE is decreased to 1440 ( previous REE 1882) and lower than predicted. Lower by 442 calories.  Discussed changing to Category 1 nutrition plan to promote weight loss and continue to work on strengthening and getting in appropriate amount of protein to promote increase in REE.   ATTESTASTION STATEMENTS:  Reviewed by clinician on day of visit: allergies, medications, problem list, medical history, surgical history, family history, social history, and previous encounter notes.   I have personally spent 46 minutes total time today in preparation, patient care, nutritional counseling and documentation for this visit, including the following: review of clinical lab tests; review of medical tests/procedures/services.       , PA-C

## 2022-10-26 ENCOUNTER — Encounter (INDEPENDENT_AMBULATORY_CARE_PROVIDER_SITE_OTHER): Payer: Self-pay | Admitting: Physician Assistant

## 2022-10-26 ENCOUNTER — Ambulatory Visit (INDEPENDENT_AMBULATORY_CARE_PROVIDER_SITE_OTHER): Payer: Commercial Managed Care - PPO | Admitting: Physician Assistant

## 2022-10-26 ENCOUNTER — Other Ambulatory Visit (HOSPITAL_COMMUNITY): Payer: Self-pay

## 2022-10-26 VITALS — BP 131/80 | HR 64 | Temp 98.1°F | Ht 67.0 in | Wt 217.0 lb

## 2022-10-26 DIAGNOSIS — E7849 Other hyperlipidemia: Secondary | ICD-10-CM

## 2022-10-26 DIAGNOSIS — E559 Vitamin D deficiency, unspecified: Secondary | ICD-10-CM | POA: Diagnosis not present

## 2022-10-26 DIAGNOSIS — E669 Obesity, unspecified: Secondary | ICD-10-CM | POA: Diagnosis not present

## 2022-10-26 DIAGNOSIS — R7303 Prediabetes: Secondary | ICD-10-CM | POA: Diagnosis not present

## 2022-10-26 DIAGNOSIS — R0602 Shortness of breath: Secondary | ICD-10-CM

## 2022-10-26 DIAGNOSIS — I10 Essential (primary) hypertension: Secondary | ICD-10-CM

## 2022-10-26 DIAGNOSIS — Z6833 Body mass index (BMI) 33.0-33.9, adult: Secondary | ICD-10-CM | POA: Diagnosis not present

## 2022-10-26 DIAGNOSIS — Z6834 Body mass index (BMI) 34.0-34.9, adult: Secondary | ICD-10-CM

## 2022-10-26 DIAGNOSIS — R5383 Other fatigue: Secondary | ICD-10-CM

## 2022-10-26 MED ORDER — METFORMIN HCL 500 MG PO TABS
500.0000 mg | ORAL_TABLET | Freq: Every day | ORAL | 0 refills | Status: DC
Start: 2022-10-26 — End: 2022-11-01
  Filled 2022-10-26: qty 30, 30d supply, fill #0

## 2022-10-27 ENCOUNTER — Encounter (INDEPENDENT_AMBULATORY_CARE_PROVIDER_SITE_OTHER): Payer: Self-pay | Admitting: Physician Assistant

## 2022-10-28 ENCOUNTER — Other Ambulatory Visit (HOSPITAL_COMMUNITY): Payer: Self-pay

## 2022-10-28 LAB — CBC WITH DIFFERENTIAL/PLATELET
Basophils Absolute: 0 10*3/uL (ref 0.0–0.2)
Basos: 1 %
EOS (ABSOLUTE): 0.1 10*3/uL (ref 0.0–0.4)
Eos: 1 %
Hematocrit: 39.4 % (ref 34.0–46.6)
Hemoglobin: 13.2 g/dL (ref 11.1–15.9)
Immature Grans (Abs): 0 10*3/uL (ref 0.0–0.1)
Immature Granulocytes: 0 %
Lymphocytes Absolute: 1.8 10*3/uL (ref 0.7–3.1)
Lymphs: 44 %
MCH: 30.7 pg (ref 26.6–33.0)
MCHC: 33.5 g/dL (ref 31.5–35.7)
MCV: 92 fL (ref 79–97)
Monocytes Absolute: 0.4 10*3/uL (ref 0.1–0.9)
Monocytes: 10 %
Neutrophils Absolute: 1.8 10*3/uL (ref 1.4–7.0)
Neutrophils: 44 %
Platelets: 252 10*3/uL (ref 150–450)
RBC: 4.3 x10E6/uL (ref 3.77–5.28)
RDW: 13.5 % (ref 11.7–15.4)
WBC: 4.2 10*3/uL (ref 3.4–10.8)

## 2022-10-28 LAB — INSULIN, RANDOM: INSULIN: 7.9 u[IU]/mL (ref 2.6–24.9)

## 2022-10-28 LAB — CMP14+EGFR
ALT: 35 IU/L — ABNORMAL HIGH (ref 0–32)
AST: 33 IU/L (ref 0–40)
Albumin: 4.3 g/dL (ref 3.8–4.9)
Alkaline Phosphatase: 71 IU/L (ref 44–121)
BUN/Creatinine Ratio: 16 (ref 9–23)
BUN: 12 mg/dL (ref 6–24)
Bilirubin Total: 0.4 mg/dL (ref 0.0–1.2)
CO2: 26 mmol/L (ref 20–29)
Calcium: 10 mg/dL (ref 8.7–10.2)
Chloride: 102 mmol/L (ref 96–106)
Creatinine, Ser: 0.76 mg/dL (ref 0.57–1.00)
Globulin, Total: 3.6 g/dL (ref 1.5–4.5)
Glucose: 90 mg/dL (ref 70–99)
Potassium: 4.1 mmol/L (ref 3.5–5.2)
Sodium: 139 mmol/L (ref 134–144)
Total Protein: 7.9 g/dL (ref 6.0–8.5)
eGFR: 92 mL/min/{1.73_m2} (ref >59)

## 2022-10-28 LAB — LIPID PANEL WITH LDL/HDL RATIO
Cholesterol, Total: 185 mg/dL (ref 100–199)
HDL: 68 mg/dL (ref >39)
LDL Chol Calc (NIH): 107 mg/dL — ABNORMAL HIGH (ref 0–99)
LDL/HDL Ratio: 1.6 ratio (ref 0.0–3.2)
Triglycerides: 52 mg/dL (ref 0–149)
VLDL Cholesterol Cal: 10 mg/dL (ref 5–40)

## 2022-10-28 LAB — VITAMIN D 25 HYDROXY (VIT D DEFICIENCY, FRACTURES): Vit D, 25-Hydroxy: 37.9 ng/mL (ref 30.0–100.0)

## 2022-10-28 LAB — HEMOGLOBIN A1C
Est. average glucose Bld gHb Est-mCnc: 140 mg/dL
Hgb A1c MFr Bld: 6.5 % — ABNORMAL HIGH (ref 4.8–5.6)

## 2022-10-28 LAB — TSH: TSH: 1.61 u[IU]/mL (ref 0.450–4.500)

## 2022-10-28 LAB — VITAMIN B12: Vitamin B-12: 557 pg/mL (ref 232–1245)

## 2022-10-28 MED ORDER — TRIAMTERENE-HCTZ 37.5-25 MG PO TABS
1.0000 | ORAL_TABLET | Freq: Every morning | ORAL | 0 refills | Status: DC
  Filled 2022-10-28: qty 90, 90d supply, fill #0

## 2022-10-28 MED ORDER — PROPRANOLOL HCL ER 120 MG PO CP24
120.0000 mg | ORAL_CAPSULE | Freq: Every day | ORAL | 0 refills | Status: DC
  Filled 2022-10-28: qty 90, 90d supply, fill #0

## 2022-10-31 DIAGNOSIS — E1165 Type 2 diabetes mellitus with hyperglycemia: Secondary | ICD-10-CM | POA: Insufficient documentation

## 2022-10-31 NOTE — Progress Notes (Signed)
.smr  Office: [phone]  /  Fax: [phone]  WEIGHT SUMMARY AND BIOMETRICS  Vitals Temp: 98.2 F (36.8 C) BP: (!) 142/86 Pulse Rate: (!) 56 SpO2: 99 %   Anthropometric Measurements Height: 5\' 7"  (1.702 m) Weight: 214 lb (97.1 kg) BMI (Calculated): 33.51 Weight at Last Visit: 217lb Weight Lost Since Last Visit: 3lb Weight Gained Since Last Visit: 0lb Starting Weight: 213lb Total Weight Loss (lbs): 0 lb (0 kg) Peak Weight: 190lb   Body Composition  Body Fat %: 41.8 % Fat Mass (lbs): 89.6 lbs Muscle Mass (lbs): 118.4 lbs Total Body Water (lbs): 81 lbs Visceral Fat Rating : 11   Other Clinical Data RMR: 1440 Fasting: no Labs: no Today's Visit #: 19 Starting Date: 05/12/21     HPI  Chief Complaint: OBESITY  Kimberly Boyd is here to discuss her progress with her obesity treatment plan. She is on the the Category 1 Plan and states she is following her eating plan approximately 75 % of the time. She states she is exercising walking/weight training 30-60 minutes 4-6 times per week.   Interval History:  Since last office visit she is down 3 lbs Bio impedence scale reviewed with the patient:  Maintained muscle mass Down 3 lbs adipose mass in ~ 1 week!   Pharmacotherapy: metformin initially started for prediabetes/ insulin resistance.   We have reviewed the risks and benefits of using a GLP-1RA/GIP medications, specifically Mounjaro for treatment of Type 2 diabetes. The patient denies a personal or family history of medullary thyroid cancer or MENII. The patient denies a history of pancreatitis. The potential risks and benefits of this GLP-1 RA/GIP therapy were reviewed with the patient, and alternative treatment options were discussed. All questions were answered, and the patient wishes to move forward with this medication, Mounjaro. She had some constipation issues in the past and was prescribed Linzess, but only took a few doses and then stopped. Manages by  increasing fiber in diet and plenty of water and exercise. Will monitor closely for constipation. Discussed taking miralax 1/2 cap every 2-3 days to start to make sure no issues and monitor.    TREATMENT PLAN FOR OBESITY:  Recommended Dietary Goals  Kimberly Boyd is currently in the action stage of change. As such, her goal is to continue weight management plan. She has agreed to the Category 1 Plan.  Behavioral Intervention  We discussed the following Behavioral Modification Strategies today: increasing lean protein intake, decreasing simple carbohydrates , increasing vegetables, increasing lower glycemic fruits, increasing fiber rich foods, avoiding skipping meals, increasing water intake, emotional eating strategies and understanding the difference between hunger signals and cravings, continue to practice mindfulness when eating, and planning for success.  Additional resources provided today: NA  Recommended Physical Activity Goals  Kimberly Boyd has been advised to work up to 150 minutes of moderate intensity aerobic activity a week and strengthening exercises 2-3 times per week for cardiovascular health, weight loss maintenance and preservation of muscle mass.   She has agreed to Continue current level of physical activity    Pharmacotherapy We discussed various medication options to help Kimberly Boyd with her weight loss efforts and we both agreed to begin [facility] for Type 2 diabetes management.    Return in about 4 weeks (around 11/29/2022).Marland Kitchen She was informed of the importance of frequent follow up visits to maximize her success with intensive lifestyle modifications for her multiple health conditions.  PHYSICAL EXAM:  Blood pressure (!) 142/86, pulse (!) 56, temperature 98.2 F (36.8 C), height  5\' 7"  (1.702 m), weight 214 lb (97.1 kg), SpO2 99%. Body mass index is 33.52 kg/m.  General: She is overweight, cooperative, alert, well developed, and in no acute distress. PSYCH: Has normal mood,  affect and thought process.   Cardiovascular: HR 50's , BP 142/86- high for patient- monitor Lungs: Normal breathing effort, no conversational dyspnea.  DIAGNOSTIC DATA REVIEWED:  BMET    Component Value Date/Time   NA 139 10/26/2022 1030   K 4.1 10/26/2022 1030   CL 102 10/26/2022 1030   CO2 26 10/26/2022 1030   GLUCOSE 90 10/26/2022 1030   GLUCOSE 141 (H) 02/20/2021 0107   BUN 12 10/26/2022 1030   CREATININE 0.76 10/26/2022 1030   CREATININE 0.61 08/01/2013 1414   CALCIUM 10.0 10/26/2022 1030   GFRNONAA >60 02/20/2021 0107   GFRNONAA >89 08/01/2013 1414   GFRAA >90 11/26/2013 0758   GFRAA >89 08/01/2013 1414   Lab Results  Component Value Date   HGBA1C 6.5 (H) 10/26/2022   HGBA1C 6.2 (H) 05/12/2020   Lab Results  Component Value Date   INSULIN 7.9 10/26/2022   INSULIN 12.4 05/12/2020   Lab Results  Component Value Date   TSH 1.610 10/26/2022   CBC    Component Value Date/Time   WBC 4.2 10/26/2022 1030   WBC 6.7 02/20/2021 0107   RBC 4.30 10/26/2022 1030   RBC 4.04 02/20/2021 0107   HGB 13.2 10/26/2022 1030   HCT 39.4 10/26/2022 1030   PLT 252 10/26/2022 1030   MCV 92 10/26/2022 1030   MCH 30.7 10/26/2022 1030   MCH 30.9 02/20/2021 0107   MCHC 33.5 10/26/2022 1030   MCHC 34.2 02/20/2021 0107   RDW 13.5 10/26/2022 1030   Iron Studies    Component Value Date/Time   FERRITIN 55 11/18/2021 0751   Lipid Panel     Component Value Date/Time   CHOL 185 10/26/2022 1030   TRIG 52 10/26/2022 1030   HDL 68 10/26/2022 1030   LDLCALC 107 (H) 10/26/2022 1030   Hepatic Function Panel     Component Value Date/Time   PROT 7.9 10/26/2022 1030   ALBUMIN 4.3 10/26/2022 1030   AST 33 10/26/2022 1030   ALT 35 (H) 10/26/2022 1030   ALKPHOS 71 10/26/2022 1030   BILITOT 0.4 10/26/2022 1030      Component Value Date/Time   TSH 1.610 10/26/2022 1030   Nutritional Lab Results  Component Value Date   VD25OH 37.9 10/26/2022   VD25OH 36.3 11/24/2020   VD25OH  40.8 05/12/2020    ASSOCIATED CONDITIONS ADDRESSED TODAY  ASSESSMENT AND PLAN  Problem List Items Addressed This Visit     Essential hypertension   Generalized obesity   Relevant Medications   tirzepatide (MOUNJARO) 2.5 MG/0.5ML Pen   Vitamin D insufficiency   Relevant Medications   Vitamin D, Ergocalciferol, (DRISDOL) 1.25 MG (50000 UNIT) CAPS capsule   Other hyperlipidemia   Type 2 diabetes mellitus with hyperglycemia, without long-term current use of insulin (HCC) New diagnosis - Primary   Relevant Medications   tirzepatide (MOUNJARO) 2.5 MG/0.5ML Pen   BMI 33.0-33.9,adult Current BMI 33.5   New diagnosis of Type 2 Diabetes Mellitus with other specified complication, without long-term current use of insulin Labs were reviewed today and discussed with the patient.  HgbA1c is not at goal. Last A1c was 6.5- new high for patient.  The patient denies a personal or family history of medullary thyroid cancer or MENII. The patient denies a history of pancreatitis. The  potential risks and benefits of this GLP-1 were reviewed with the patient, and alternative treatment options were discussed. All questions were answered, and the patient wishes to move forward with this medication.  She is Continue working on nutrition plan to decrease simple carbohydrates, increase lean proteins and exercise to promote weight loss and improve glycemic control .   Medication(s): None  Lab Results  Component Value Date   HGBA1C 6.5 (H) 10/26/2022   HGBA1C 6.0 (H) 11/18/2021   HGBA1C 6.2 (H) 08/05/2021   Lab Results  Component Value Date   LDLCALC 107 (H) 10/26/2022   CREATININE 0.76 10/26/2022   No results found for: "GFR"  Plan: Start Mounjaro 2.5 mg SQ weekly and titrate accordingly.  Continue working on nutrition plan to decrease simple carbohydrates, increase lean proteins and exercise to promote weight loss and improve glycemic control. Will plan to recheck labs in 2-3 months after  starting Mounjaro.  She had some constipation issues in the past and was prescribed Linzess, but only took a few doses and then stopped. Manages by increasing fiber in diet and plenty of water and exercise. Will monitor closely for constipation. Discussed taking miralax 1/2 cap every 2-3 days to start to make sure no issues and monitor.   Hyperlipidemia Labs were reviewed today and discussed with the patient.   LDL is not at goal but much improved. HDL 68- at goal. Trig 52- at goal.  Medication(s): None Cardiovascular risk factors: diabetes mellitus, dyslipidemia, hypertension, and obesity (BMI >= 30 kg/m2)  Lab Results  Component Value Date   CHOL 185 10/26/2022   HDL 68 10/26/2022   LDLCALC 107 (H) 10/26/2022   TRIG 52 10/26/2022   Lab Results  Component Value Date   ALT 35 (H) 10/26/2022   AST 33 10/26/2022   ALKPHOS 71 10/26/2022   BILITOT 0.4 10/26/2022   The 10-year ASCVD risk score (Arnett DK, et al., 2019) is: 13.2%   Values used to calculate the score:     Age: 57 years     Sex: Female     Is Non-Hispanic African American: Yes     Diabetic: Yes     Tobacco smoker: No     Systolic Blood Pressure: 142 mmHg     Is BP treated: Yes     HDL Cholesterol: 68 mg/dL     Total Cholesterol: 185 mg/dL  Plan: Continue to work on Engineer, technical sales -decreasing simple carbohydrates, increasing lean proteins, decreasing saturated fats and cholesterol , avoiding trans fats and exercise as able to promote weight loss, improve lipids and decrease cardiovascular risks. Starting [facility] which should decrease CV risks.  Will plan to recheck fasting labs in 2-3 months and if does not continue to improve, may want to add statin.    Hypertension Hypertension reasonably well controlled, no significant medication side effects noted, and needs further observation.  Medication(s): Maxzide 37.5-25 mg daily Renal function normal.  BP up a bit today. Monitor as should improve with weight loss and  treatment with GLP-1 RA/GIP medication.   BP Readings from Last 3 Encounters:  11/01/22 (!) 142/86  10/26/22 131/80  09/25/22 133/85   Lab Results  Component Value Date   CREATININE 0.76 10/26/2022   CREATININE 0.71 11/18/2021   CREATININE 0.80 08/05/2021   No results found for: "GFR"  Plan: Continue all antihypertensives at current dosages. Monitor BP closely following treatment with Mounjaro.  Continue to work on nutrition plan to promote weight loss and improve BP control.  Vitamin D Deficiency Vitamin D is not at goal of 50.  Most recent vitamin D level was 37.9. She is on OTC vitamin D 3 4000 units daily. Lab Results  Component Value Date   VD25OH 37.9 10/26/2022   VD25OH 36.3 11/24/2020   VD25OH 40.8 05/12/2020    Plan: Start  prescription ergocalciferol 50,000 IU weekly Low vitamin D levels can be associated with adiposity and may result in leptin resistance and weight gain. Also associated with fatigue. Currently on vitamin D supplementation without any adverse effects.  Recheck vitamin D level in 2-3 months to optimize supplemenation/avoid over supplementation.,  ATTESTASTION STATEMENTS:  Reviewed by clinician on day of visit: allergies, medications, problem list, medical history, surgical history, family history, social history, and previous encounter notes.   I have personally spent 42 minutes total time today in preparation, patient care, nutritional counseling and documentation for this visit, including the following: review of clinical lab tests; review of medical tests/procedures/services.      Lamar Naef, PA-C

## 2022-11-01 ENCOUNTER — Ambulatory Visit (INDEPENDENT_AMBULATORY_CARE_PROVIDER_SITE_OTHER): Payer: Commercial Managed Care - PPO | Admitting: Physician Assistant

## 2022-11-01 ENCOUNTER — Telehealth (INDEPENDENT_AMBULATORY_CARE_PROVIDER_SITE_OTHER): Payer: Self-pay | Admitting: Physician Assistant

## 2022-11-01 ENCOUNTER — Encounter (INDEPENDENT_AMBULATORY_CARE_PROVIDER_SITE_OTHER): Payer: Self-pay | Admitting: Physician Assistant

## 2022-11-01 ENCOUNTER — Other Ambulatory Visit (HOSPITAL_COMMUNITY): Payer: Self-pay

## 2022-11-01 VITALS — BP 142/86 | HR 56 | Temp 98.2°F | Ht 67.0 in | Wt 214.0 lb

## 2022-11-01 DIAGNOSIS — E559 Vitamin D deficiency, unspecified: Secondary | ICD-10-CM | POA: Diagnosis not present

## 2022-11-01 DIAGNOSIS — E785 Hyperlipidemia, unspecified: Secondary | ICD-10-CM | POA: Diagnosis not present

## 2022-11-01 DIAGNOSIS — Z6833 Body mass index (BMI) 33.0-33.9, adult: Secondary | ICD-10-CM | POA: Diagnosis not present

## 2022-11-01 DIAGNOSIS — E1165 Type 2 diabetes mellitus with hyperglycemia: Secondary | ICD-10-CM

## 2022-11-01 DIAGNOSIS — E7849 Other hyperlipidemia: Secondary | ICD-10-CM

## 2022-11-01 DIAGNOSIS — Z6831 Body mass index (BMI) 31.0-31.9, adult: Secondary | ICD-10-CM | POA: Insufficient documentation

## 2022-11-01 DIAGNOSIS — Z7985 Long-term (current) use of injectable non-insulin antidiabetic drugs: Secondary | ICD-10-CM

## 2022-11-01 DIAGNOSIS — I10 Essential (primary) hypertension: Secondary | ICD-10-CM | POA: Diagnosis not present

## 2022-11-01 DIAGNOSIS — Z6832 Body mass index (BMI) 32.0-32.9, adult: Secondary | ICD-10-CM | POA: Insufficient documentation

## 2022-11-01 DIAGNOSIS — E669 Obesity, unspecified: Secondary | ICD-10-CM | POA: Diagnosis not present

## 2022-11-01 MED ORDER — VITAMIN D (ERGOCALCIFEROL) 1.25 MG (50000 UNIT) PO CAPS
50000.0000 [IU] | ORAL_CAPSULE | ORAL | 0 refills | Status: DC
Start: 2022-11-01 — End: 2022-11-28
  Filled 2022-11-01: qty 4, 28d supply, fill #0

## 2022-11-01 MED ORDER — TIRZEPATIDE 2.5 MG/0.5ML ~~LOC~~ SOAJ
2.5000 mg | SUBCUTANEOUS | 0 refills | Status: DC
Start: 2022-11-01 — End: 2022-11-28
  Filled 2022-11-01: qty 2, 28d supply, fill #0

## 2022-11-01 NOTE — Telephone Encounter (Signed)
Prior authorization done via cover my meds for patients Mounjaro. Waiting on determination.  

## 2022-11-02 ENCOUNTER — Other Ambulatory Visit (HOSPITAL_COMMUNITY): Payer: Self-pay

## 2022-11-03 NOTE — Telephone Encounter (Signed)
PA for Greggory Keen was approved through 11/01/2023. Pt. Notified via Mychart. 8:45 11/03/2022 KP

## 2022-11-09 ENCOUNTER — Other Ambulatory Visit: Payer: Self-pay

## 2022-11-09 ENCOUNTER — Other Ambulatory Visit (HOSPITAL_COMMUNITY): Payer: Self-pay

## 2022-11-09 DIAGNOSIS — R002 Palpitations: Secondary | ICD-10-CM | POA: Diagnosis not present

## 2022-11-09 DIAGNOSIS — Z6834 Body mass index (BMI) 34.0-34.9, adult: Secondary | ICD-10-CM | POA: Diagnosis not present

## 2022-11-09 DIAGNOSIS — M25561 Pain in right knee: Secondary | ICD-10-CM | POA: Diagnosis not present

## 2022-11-09 DIAGNOSIS — I10 Essential (primary) hypertension: Secondary | ICD-10-CM | POA: Diagnosis not present

## 2022-11-09 DIAGNOSIS — Z0000 Encounter for general adult medical examination without abnormal findings: Secondary | ICD-10-CM | POA: Diagnosis not present

## 2022-11-09 DIAGNOSIS — K5909 Other constipation: Secondary | ICD-10-CM | POA: Diagnosis not present

## 2022-11-09 DIAGNOSIS — Z86010 Personal history of colonic polyps: Secondary | ICD-10-CM | POA: Diagnosis not present

## 2022-11-09 DIAGNOSIS — F419 Anxiety disorder, unspecified: Secondary | ICD-10-CM | POA: Diagnosis not present

## 2022-11-09 DIAGNOSIS — R1012 Left upper quadrant pain: Secondary | ICD-10-CM | POA: Diagnosis not present

## 2022-11-09 DIAGNOSIS — R7303 Prediabetes: Secondary | ICD-10-CM | POA: Diagnosis not present

## 2022-11-09 MED ORDER — LINZESS 72 MCG PO CAPS
72.0000 ug | ORAL_CAPSULE | Freq: Every day | ORAL | 3 refills | Status: DC
  Filled 2022-11-09: qty 90, 90d supply, fill #0

## 2022-11-09 MED ORDER — PROPRANOLOL HCL ER 120 MG PO CP24
120.0000 mg | ORAL_CAPSULE | Freq: Every day | ORAL | 3 refills | Status: DC
  Filled 2022-11-09 – 2023-01-30 (×3): qty 90, 90d supply, fill #0
  Filled 2023-05-27: qty 90, 90d supply, fill #1

## 2022-11-09 MED ORDER — TRIAMTERENE-HCTZ 37.5-25 MG PO TABS
1.0000 | ORAL_TABLET | Freq: Every morning | ORAL | 3 refills | Status: DC
  Filled 2022-11-09 – 2023-01-30 (×3): qty 90, 90d supply, fill #0

## 2022-11-09 MED ORDER — ALPRAZOLAM 0.5 MG PO TABS
0.5000 mg | ORAL_TABLET | Freq: Two times a day (BID) | ORAL | 0 refills | Status: AC | PRN
  Filled 2022-11-09: qty 30, 15d supply, fill #0

## 2022-11-15 ENCOUNTER — Encounter (INDEPENDENT_AMBULATORY_CARE_PROVIDER_SITE_OTHER): Payer: Self-pay | Admitting: Physician Assistant

## 2022-11-15 DIAGNOSIS — I820 Budd-Chiari syndrome: Secondary | ICD-10-CM | POA: Diagnosis not present

## 2022-11-15 DIAGNOSIS — L918 Other hypertrophic disorders of the skin: Secondary | ICD-10-CM | POA: Diagnosis not present

## 2022-11-15 DIAGNOSIS — L562 Photocontact dermatitis [berloque dermatitis]: Secondary | ICD-10-CM | POA: Diagnosis not present

## 2022-11-18 ENCOUNTER — Other Ambulatory Visit (HOSPITAL_COMMUNITY): Payer: Self-pay

## 2022-11-28 ENCOUNTER — Other Ambulatory Visit (HOSPITAL_COMMUNITY): Payer: Self-pay

## 2022-11-28 ENCOUNTER — Encounter (INDEPENDENT_AMBULATORY_CARE_PROVIDER_SITE_OTHER): Payer: Self-pay | Admitting: Physician Assistant

## 2022-11-28 ENCOUNTER — Ambulatory Visit (INDEPENDENT_AMBULATORY_CARE_PROVIDER_SITE_OTHER): Payer: Commercial Managed Care - PPO | Admitting: Physician Assistant

## 2022-11-28 VITALS — BP 109/73 | HR 64 | Temp 97.7°F | Ht 67.0 in | Wt 214.0 lb

## 2022-11-28 DIAGNOSIS — E1165 Type 2 diabetes mellitus with hyperglycemia: Secondary | ICD-10-CM | POA: Diagnosis not present

## 2022-11-28 DIAGNOSIS — Z7985 Long-term (current) use of injectable non-insulin antidiabetic drugs: Secondary | ICD-10-CM

## 2022-11-28 DIAGNOSIS — Z6833 Body mass index (BMI) 33.0-33.9, adult: Secondary | ICD-10-CM

## 2022-11-28 DIAGNOSIS — E559 Vitamin D deficiency, unspecified: Secondary | ICD-10-CM

## 2022-11-28 DIAGNOSIS — I152 Hypertension secondary to endocrine disorders: Secondary | ICD-10-CM

## 2022-11-28 DIAGNOSIS — E1159 Type 2 diabetes mellitus with other circulatory complications: Secondary | ICD-10-CM

## 2022-11-28 DIAGNOSIS — E669 Obesity, unspecified: Secondary | ICD-10-CM

## 2022-11-28 MED ORDER — VITAMIN D (ERGOCALCIFEROL) 1.25 MG (50000 UNIT) PO CAPS
50000.0000 [IU] | ORAL_CAPSULE | ORAL | 0 refills | Status: DC
  Filled 2022-11-28: qty 4, 28d supply, fill #0

## 2022-11-28 MED ORDER — TIRZEPATIDE 5 MG/0.5ML ~~LOC~~ SOAJ
5.0000 mg | SUBCUTANEOUS | 0 refills | Status: DC
Start: 2022-11-28 — End: 2022-12-28
  Filled 2022-11-28: qty 2, 28d supply, fill #0

## 2022-11-28 NOTE — Progress Notes (Signed)
.[facility]  Office: [phone]  /  Fax: [phone]  WEIGHT SUMMARY AND BIOMETRICS  Vitals Temp: 97.7 F (36.5 C) BP: 109/73 Pulse Rate: 64 SpO2: 97 %   Anthropometric Measurements Height: 5\' 7"  (1.702 m) Weight: 214 lb (97.1 kg) BMI (Calculated): 33.51 Weight at Last Visit: 214 lb Weight Lost Since Last Visit: 0 Weight Gained Since Last Visit: 0 Starting Weight: 213 lb Total Weight Loss (lbs): 0 lb (0 kg) Peak Weight: 220 lb   Body Composition  Body Fat %: 44.2 % Fat Mass (lbs): 95 lbs Muscle Mass (lbs): 113.8 lbs Total Body Water (lbs): 81.2 lbs Visceral Fat Rating : 12   Other Clinical Data Fasting: no Labs: no Today's Visit #: 20 Starting Date: 05/12/20     HPI  Chief Complaint: OBESITY  Kimberly Boyd is here to discuss her progress with her obesity treatment plan. She is on the the Category 1 Plan and states she is following her eating plan approximately 50 % of the time. She states she is exercising walking/weights 45-50 minutes 3-4 times per week.   Interval History:  Since last office visit she maintained weight.  The patient is a 57 year old female with a history of obesity, newly diagnosed type 2 diabetes, hypertension, and vitamin D deficiency. She presents for a follow-up of her obesity treatment plan. She reports no significant changes with the initiation of Mounjaro 2.5 mg weekly for diabetes management, but notes some decrease in appetite and portion sizes. She also reports noticing some changes in her clothing fit, suggesting possible weight loss. The patient has been engaging in weight lifting as part of her weight loss regimen. She has been experiencing constipation, for which she has taken Miralax. She has not reported any adverse effects from her vitamin D supplementation. The patient's blood pressure has been noted to be lower, and she has been advised to stay hydrated. The patient's ALT levels were slightly elevated, possibly indicating metabolic  associated fatty liver disease, although a previous CT scan did not show any focal liver issues.   Pharmacotherapy: Mounjaro 2.5 mg weekly. Denies mass in neck, dysphagia, dyspepsia, persistent hoarseness, abdominal pain, or N/V/Constipation or diarrhea. Has annual eye exam. Mood is stable.    TREATMENT PLAN FOR OBESITY:  Recommended Dietary Goals  Kimberly Boyd is currently in the action stage of change. As such, her goal is to continue weight management plan. She has agreed to the Category 1 Plan.  Behavioral Intervention  We discussed the following Behavioral Modification Strategies today: increasing lean protein intake, decreasing simple carbohydrates , increasing vegetables, increasing lower glycemic fruits, increasing fiber rich foods, avoiding skipping meals, increasing water intake, emotional eating strategies and understanding the difference between hunger signals and cravings, work on managing stress, creating time for self-care and relaxation measures, continue to practice mindfulness when eating, and planning for success.  Additional resources provided today: NA  Recommended Physical Activity Goals  Kimberly Boyd has been advised to work up to 150 minutes of moderate intensity aerobic activity a week and strengthening exercises 2-3 times per week for cardiovascular health, weight loss maintenance and preservation of muscle mass.   She has agreed to Continue current level of physical activity    Pharmacotherapy We discussed various medication options to help Kimberly Boyd with her weight loss efforts and we both agreed to increase Mounjaro to 5 mg weekly for Type 2 diabetes.    Return in about 4 weeks (around 12/26/2022).Marland Kitchen She was informed of the importance of frequent follow up visits to maximize her  success with intensive lifestyle modifications for her multiple health conditions.  PHYSICAL EXAM:  Blood pressure 109/73, pulse 64, temperature 97.7 F (36.5 C), height 5\' 7"  (1.702 m), weight  214 lb (97.1 kg), SpO2 97%. Body mass index is 33.52 kg/m.  General: She is overweight, cooperative, alert, well developed, and in no acute distress. PSYCH: Has normal mood, affect and thought process.   Cardiovascular: HR 60's BP 109/73 Lungs: Normal breathing effort, no conversational dyspnea.  DIAGNOSTIC DATA REVIEWED:  BMET    Component Value Date/Time   NA 139 10/26/2022 1030   K 4.1 10/26/2022 1030   CL 102 10/26/2022 1030   CO2 26 10/26/2022 1030   GLUCOSE 90 10/26/2022 1030   GLUCOSE 141 (H) 02/20/2021 0107   BUN 12 10/26/2022 1030   CREATININE 0.76 10/26/2022 1030   CREATININE 0.61 08/01/2013 1414   CALCIUM 10.0 10/26/2022 1030   GFRNONAA >60 02/20/2021 0107   GFRNONAA >89 08/01/2013 1414   GFRAA >90 11/26/2013 0758   GFRAA >89 08/01/2013 1414   Lab Results  Component Value Date   HGBA1C 6.5 (H) 10/26/2022   HGBA1C 6.2 (H) 05/12/2020   Lab Results  Component Value Date   INSULIN 7.9 10/26/2022   INSULIN 12.4 05/12/2020   Lab Results  Component Value Date   TSH 1.610 10/26/2022   CBC    Component Value Date/Time   WBC 4.2 10/26/2022 1030   WBC 6.7 02/20/2021 0107   RBC 4.30 10/26/2022 1030   RBC 4.04 02/20/2021 0107   HGB 13.2 10/26/2022 1030   HCT 39.4 10/26/2022 1030   PLT 252 10/26/2022 1030   MCV 92 10/26/2022 1030   MCH 30.7 10/26/2022 1030   MCH 30.9 02/20/2021 0107   MCHC 33.5 10/26/2022 1030   MCHC 34.2 02/20/2021 0107   RDW 13.5 10/26/2022 1030   Iron Studies    Component Value Date/Time   FERRITIN 55 11/18/2021 0751   Lipid Panel     Component Value Date/Time   CHOL 185 10/26/2022 1030   TRIG 52 10/26/2022 1030   HDL 68 10/26/2022 1030   LDLCALC 107 (H) 10/26/2022 1030   Hepatic Function Panel     Component Value Date/Time   PROT 7.9 10/26/2022 1030   ALBUMIN 4.3 10/26/2022 1030   AST 33 10/26/2022 1030   ALT 35 (H) 10/26/2022 1030   ALKPHOS 71 10/26/2022 1030   BILITOT 0.4 10/26/2022 1030      Component Value  Date/Time   TSH 1.610 10/26/2022 1030   Nutritional Lab Results  Component Value Date   VD25OH 37.9 10/26/2022   VD25OH 36.3 11/24/2020   VD25OH 40.8 05/12/2020    ASSOCIATED CONDITIONS ADDRESSED TODAY  ASSESSMENT AND PLAN  Problem List Items Addressed This Visit     Hypertension associated with diabetes (HCC)   Generalized obesity   Vitamin D insufficiency   Type 2 diabetes mellitus with hyperglycemia, without long-term current use of insulin (HCC) New diagnosis - Primary   BMI 33.0-33.9,adult Current BMI 33.5  Obesity and Type 2 Diabetes Recently started on Mounjaro 2.5 mg weekly. Noted some decrease in appetite and portion sizes. Completed loading dose and ready to increase dose. -Increase Mounjaro to 5 mg weekly. -Continue mindful eating and portion control. -Recheck A1c in 2 months.  Constipation History of constipation, currently on Linzess but not taking on regular basis, but has available if needed. Noted some improvement with Miralax. -Continue Miralax if needed.  -Monitor bowel movements closely.  Vitamin D Deficiency No  reported issues with current supplementation, Ergocalciferol 50,000 units weekly. No N/V or muscle weakness reported.  -Continue/Refill current Vitamin D supplementation- Ergocalciferol 50,000 units weekly.  -Recheck Vitamin D levels in 2 months.  Hypertension Blood pressure noted to be 109/73, which is lower than previous readings. Discussed potential effects of Mounjaro on blood pressure. -Monitor blood pressure closely. Continue triamterence-hydrochlorothiazide 37.5-25 mg daily Takes propranolol ER 120 mg daily for palpitations as well.  -Ensure adequate hydration. Continue to work on nutrition plan to promote weight loss and improve BP control.   Nutrition Discussed protein intake and potential impact on kidney function. Reviewed breakfast options and discussed importance of portion control. -Ensure at least 75 grams of protein intake  daily. -Continue mindful eating and portion control.  Follow-up in 4 weeks to assess progress and adjust treatment plan as necessary.  ATTESTASTION STATEMENTS:  Reviewed by clinician on day of visit: allergies, medications, problem list, medical history, surgical history, family history, social history, and previous encounter notes.   I have personally spent 46 minutes total time today in preparation, patient care, nutritional counseling and documentation for this visit, including the following: review of clinical lab tests; review of medical tests/procedures/services.      Juron Vorhees, PA-C

## 2022-11-29 ENCOUNTER — Other Ambulatory Visit (HOSPITAL_COMMUNITY): Payer: Self-pay

## 2022-12-12 ENCOUNTER — Encounter (INDEPENDENT_AMBULATORY_CARE_PROVIDER_SITE_OTHER): Payer: Self-pay | Admitting: Physician Assistant

## 2022-12-13 ENCOUNTER — Other Ambulatory Visit (HOSPITAL_COMMUNITY): Payer: Self-pay

## 2022-12-14 ENCOUNTER — Telehealth: Payer: Commercial Managed Care - PPO | Admitting: Physician Assistant

## 2022-12-14 DIAGNOSIS — M109 Gout, unspecified: Secondary | ICD-10-CM

## 2022-12-14 MED ORDER — COLCHICINE 0.6 MG PO TABS
ORAL_TABLET | ORAL | 0 refills | Status: DC
Start: 2022-12-14 — End: 2023-12-07

## 2022-12-14 NOTE — Progress Notes (Signed)
Virtual Visit Consent   Kimberly Boyd, you are scheduled for a virtual visit with a [facility] provider today. Just as with appointments in the office, your consent must be obtained to participate. Your consent will be active for this visit and any virtual visit you may have with one of our providers in the next 365 days. If you have a MyChart account, a copy of this consent can be sent to you electronically.  As this is a virtual visit, video technology does not allow for your provider to perform a traditional examination. This may limit your provider's ability to fully assess your condition. If your provider identifies any concerns that need to be evaluated in person or the need to arrange testing (such as labs, EKG, etc.), we will make arrangements to do so. Although advances in technology are sophisticated, we cannot ensure that it will always work on either your end or our end. If the connection with a video visit is poor, the visit may have to be switched to a telephone visit. With either a video or telephone visit, we are not always able to ensure that we have a secure connection.  By engaging in this virtual visit, you consent to the provision of healthcare and authorize for your insurance to be billed (if applicable) for the services provided during this visit. Depending on your insurance coverage, you may receive a charge related to this service.  I need to obtain your verbal consent now. Are you willing to proceed with your visit today? Kimberly Boyd has provided verbal consent on 12/14/2022 for a virtual visit (video or telephone). Kimberly Boyd, [place]  Date: 12/14/2022 3:52 PM  Virtual Visit via Video Note   I, Kimberly Boyd, connected with  Kimberly Boyd  ([id number], January 10, 1966) on 12/14/22 at  3:30 PM EDT by a video-enabled telemedicine application and verified that I am speaking with the correct person using two identifiers.  Location: Patient: Virtual Visit Location  Patient: Home Provider: Virtual Visit Location Provider: Home Office   I discussed the limitations of evaluation and management by telemedicine and the availability of in person appointments. The patient expressed understanding and agreed to proceed.    History of Present Illness: Kimberly Boyd is a 57 y.o. who identifies as a female who was assigned female at birth, and is being seen today for possible gout of R index finger. Has history of gout in other sites but states this feels almost identical otherwise. @ days ago woke up with pain and swelling in DIP of R index finger. Denies trauma or injury. Denies nail bed swelling or drainage. Able to move the joint albeit with pain. Denies fever, chills, malaise. Notes no alcohol consumption. Increase in dietary protein recently as well as poor hydration. Is on thiazide diuretic as well.   HPI: HPI  Problems:  Patient Active Problem List   Diagnosis Date Noted   BMI 33.0-33.9,adult Current BMI 33.5 11/01/2022   Type 2 diabetes mellitus with hyperglycemia, without long-term current use of insulin (HCC) New diagnosis 10/31/2022   Primary osteoarthritis of right knee 02/23/2021   Other hyperlipidemia 12/22/2020   Prediabetes 05/13/2020   Arthritis of right knee 05/13/2020   Asymptomatic varicose veins of bilateral lower extremities 05/13/2020   Dysplastic nevus 05/13/2020   Elevated erythrocyte sedimentation rate 05/13/2020   Gout 05/13/2020   Gouty arthropathy 05/13/2020   Hot flashes 05/13/2020   Hyperglycemia 05/13/2020   Insomnia 05/13/2020   Menopause 05/13/2020  Iron deficiency anemia due to chronic blood loss 05/13/2020   Pica 05/13/2020   Pure hypercholesterolemia 05/13/2020   Right knee pain 05/13/2020   Sacroiliitis, not elsewhere classified (HCC) 05/13/2020   Vitamin D insufficiency 05/13/2020   Generalized obesity    Hypertension associated with diabetes (HCC) 08/03/2017   Osteoarthritis of right knee 05/20/2015   Fibroid,  uterine    Fever 09/30/2010   Supraventricular tachycardia-probable concealed accessory pathway 08/31/2010   Stress 08/31/2010   Allergic rhinitis 11/20/2008   GERD 11/20/2008   PALPITATIONS 11/20/2008   CHEST PAIN 11/20/2008    Allergies:  Allergies  Allergen Reactions   Doxycycline Other (See Comments)    Esophagitis; opened up in my esophagus   Zoster Vac Recomb Adjuvanted     Other Reaction(s): severe fevers, chills   Medications:  Current Outpatient Medications:    colchicine 0.6 MG tablet, Take 2 tablets immediately, then 1 tablet twice daily for up to max of 7 days, Disp: 15 tablet, Rfl: 0   ALPRAZolam (XANAX) 0.5 MG tablet, Take 1 tablet (0.5 mg total) by mouth 2 (two) times daily as needed., Disp: 30 tablet, Rfl: 0   Cholecalciferol 100 MCG (4000 UT) CAPS, Take 1 capsule (4,000 Units total) by mouth daily., Disp: 30 capsule, Rfl:    ELDERBERRY PO, Take 1 tablet by mouth 3 (three) times a week., Disp: , Rfl:    linaclotide (LINZESS) 72 MCG capsule, Take 1 capsule (72 mcg total) by mouth daily at least 30 minutes before the first meal of the day on an empty stomach, Disp: 30 capsule, Rfl: 0   linaclotide (LINZESS) 72 MCG capsule, Take 1 capsule (72 mcg total) by mouth daily as needed at least 30 minutes before the first meal of the day on an empty stomach, Disp: 90 capsule, Rfl: 3   omeprazole (PRILOSEC) 20 MG capsule, Take 1 capsule by mouth once a day, Disp: 90 capsule, Rfl: 0   potassium chloride (KLOR-CON M) 10 MEQ tablet, Take 1 tablet by mouth with food once a day., Disp: 90 tablet, Rfl: 3   Probiotic Product (PROBIOTIC PO), Take 1 capsule by mouth daily., Disp: , Rfl:    propranolol ER (INDERAL LA) 120 MG 24 hr capsule, Take 1 capsule (120 mg total) by mouth daily., Disp: 90 capsule, Rfl: 3   tirzepatide (MOUNJARO) 5 MG/0.5ML Pen, Inject 5 mg into the skin once a week., Disp: 2 mL, Rfl: 0   tretinoin (RETIN-A) 0.025 % cream, Apply to affected areas on face at bedtime  (Patient not taking: Reported on 11/28/2022), Disp: 45 g, Rfl: PRN   triamterene-hydrochlorothiazide (MAXZIDE-25) 37.5-25 MG tablet, Take 1 tablet by mouth every morning., Disp: 90 tablet, Rfl: 3   vitamin C (ASCORBIC ACID) 500 MG tablet, Take 1,000 mg by mouth daily., Disp: , Rfl:    Vitamin D, Ergocalciferol, (DRISDOL) 1.25 MG (50000 UNIT) CAPS capsule, Take 1 capsule (50,000 Units total) by mouth every 7 (seven) days., Disp: 4 capsule, Rfl: 0  Observations/Objective: Patient is well-developed, well-nourished in no acute distress.  Resting comfortably  at home.  Head is normocephalic, atraumatic.  No labored breathing.  Speech is clear and coherent with logical content.  Patient is alert and oriented at baseline.  Redness and swelling of DIP of R index finger. Normal ROM demonstrated. No evidence of trauma, injury or puncture. No evidence of paronychia.  Assessment and Plan: 1. Acute gout of right hand, unspecified cause - colchicine 0.6 MG tablet; Take 2 tablets immediately, then  1 tablet twice daily for up to max of 7 days  Dispense: 15 tablet; Refill: 0  Supportive measures and OTC medications reviewed. Colchicine per orders. Needs follow-up with PCP. UC/ER precautions reviewed with patient. Work note declined.   Follow Up Instructions: I discussed the assessment and treatment plan with the patient. The patient was provided an opportunity to ask questions and all were answered. The patient agreed with the plan and demonstrated an understanding of the instructions.  A copy of instructions were sent to the patient via MyChart unless otherwise noted below.   The patient was advised to call back or seek an in-person evaluation if the symptoms worsen or if the condition fails to improve as anticipated.  Time:  I spent 10 minutes with the patient via telehealth technology discussing the above problems/concerns.    Kimberly Climes, PA-C

## 2022-12-14 NOTE — Patient Instructions (Signed)
Kimberly Boyd, thank you for joining Piedad Climes, PA-C for today's virtual visit.  While this provider is not your primary care provider (PCP), if your PCP is located in our provider database this encounter information will be shared with them immediately following your visit.   A [facility] MyChart account gives you access to today's visit and all your visits, tests, and labs performed at [facility] " click here if you don't have a [facility] MyChart account or go to [email or web address]  Consent: (Patient) Kimberly Boyd provided verbal consent for this virtual visit at the beginning of the encounter.  Current Medications:  Current Outpatient Medications:    ALPRAZolam (XANAX) 0.5 MG tablet, Take 1 tablet (0.5 mg total) by mouth 2 (two) times daily as needed., Disp: 30 tablet, Rfl: 0   Cholecalciferol 100 MCG (4000 UT) CAPS, Take 1 capsule (4,000 Units total) by mouth daily., Disp: 30 capsule, Rfl:    ELDERBERRY PO, Take 1 tablet by mouth 3 (three) times a week., Disp: , Rfl:    linaclotide (LINZESS) 72 MCG capsule, Take 1 capsule (72 mcg total) by mouth daily at least 30 minutes before the first meal of the day on an empty stomach, Disp: 30 capsule, Rfl: 0   linaclotide (LINZESS) 72 MCG capsule, Take 1 capsule (72 mcg total) by mouth daily as needed at least 30 minutes before the first meal of the day on an empty stomach, Disp: 90 capsule, Rfl: 3   omeprazole (PRILOSEC) 20 MG capsule, Take 1 capsule by mouth once a day, Disp: 90 capsule, Rfl: 0   potassium chloride (KLOR-CON M) 10 MEQ tablet, Take 1 tablet by mouth with food once a day., Disp: 90 tablet, Rfl: 3   Probiotic Product (PROBIOTIC PO), Take 1 capsule by mouth daily., Disp: , Rfl:    propranolol ER (INDERAL LA) 120 MG 24 hr capsule, Take 1 capsule (120 mg total) by mouth daily., Disp: 90 capsule, Rfl: 3   tirzepatide (MOUNJARO) 5 MG/0.5ML Pen, Inject 5 mg into the skin once a week., Disp: 2 mL, Rfl: 0    tretinoin (RETIN-A) 0.025 % cream, Apply to affected areas on face at bedtime (Patient not taking: Reported on 11/28/2022), Disp: 45 g, Rfl: PRN   triamterene-hydrochlorothiazide (MAXZIDE-25) 37.5-25 MG tablet, Take 1 tablet by mouth every morning., Disp: 90 tablet, Rfl: 3   vitamin C (ASCORBIC ACID) 500 MG tablet, Take 1,000 mg by mouth daily., Disp: , Rfl:    Vitamin D, Ergocalciferol, (DRISDOL) 1.25 MG (50000 UNIT) CAPS capsule, Take 1 capsule (50,000 Units total) by mouth every 7 (seven) days., Disp: 4 capsule, Rfl: 0   Medications ordered in this encounter:  No orders of the defined types were placed in this encounter.    *If you need refills on other medications prior to your next appointment, please contact your pharmacy*  Follow-Up: Call back or seek an in-person evaluation if the symptoms worsen or if the condition fails to improve as anticipated.  [facility] [phone]  Other Instructions Gout  Gout is painful swelling of your joints. Gout is a type of arthritis. It is caused by having too much uric acid in your body. Uric acid is a chemical that is made when your body breaks down substances called purines. If your body has too much uric acid, sharp crystals can form and build up in your joints. This causes pain and swelling. Gout attacks can happen quickly and be very painful (acute  gout). Over time, the attacks can affect more joints and happen more often (chronic gout). What are the causes? Gout is caused by too much uric acid in your blood. This can happen because: Your kidneys do not remove enough uric acid from your blood. Your body makes too much uric acid. You eat too many foods that are high in purines. These foods include organ meats, some seafood, and beer. Trauma or stress can bring on an attack. What increases the risk? Having a family history of gout. Being female and middle-aged. Being female and having gone through menopause. Having an organ  transplant. Taking certain medicines. Having certain conditions, such as: Being very overweight (obese). Lead poisoning. Kidney disease. A skin condition called psoriasis. Other risks include: Losing weight too quickly. Not having enough water in the body (being dehydrated). Drinking alcohol, especially beer. Drinking beverages that are sweetened with a type of sugar called fructose. What are the signs or symptoms? An attack of acute gout often starts at night and usually happens in just one joint. The most common place is the big toe. Other joints that may be affected include joints of the feet, ankle, knee, fingers, wrist, or elbow. Symptoms may include: Very bad pain. Warmth. Swelling. Stiffness. Tenderness. The affected joint may be very painful to touch. Shiny, red, or purple skin. Chills and fever. Chronic gout may cause symptoms more often. More joints may be involved. You may also have white or yellow lumps (tophi) on your hands or feet or in other areas near your joints. How is this treated? Treatment for an acute attack may include medicines for pain and swelling, such as: NSAIDs, such as ibuprofen. Steroids taken by mouth or injected into a joint. Colchicine. This can be given by mouth or through an IV tube. Treatment to prevent future attacks may include: Taking small doses of NSAIDs or colchicine daily. Using a medicine that reduces uric acid levels in your blood, such as allopurinol. Making changes to your diet. You may need to see a food expert (dietitian) about what to eat and drink to prevent gout. Follow these instructions at home: During a gout attack  If told, put ice on the painful area. To do this: Put ice in a plastic bag. Place a towel between your skin and the bag. Leave the ice on for 20 minutes, 2-3 times a day. Take off the ice if your skin turns bright red. This is very important. If you cannot feel pain, heat, or cold, you have a greater risk of  damage to the area. Raise the painful joint above the level of your heart as often as you can. Rest the joint as much as possible. If the joint is in your leg, you may be given crutches. Follow instructions from your doctor about what you cannot eat or drink. Avoiding future gout attacks Eat a low-purine diet. Avoid foods and drinks such as: Liver. Kidney. Anchovies. Asparagus. Herring. Mushrooms. Mussels. Beer. Stay at a healthy weight. If you want to lose weight, talk with your doctor. Do not lose weight too fast. Start or continue an exercise plan as told by your doctor. Eating and drinking Avoid drinks sweetened by fructose. Drink enough fluids to keep your pee (urine) pale yellow. If you drink alcohol: Limit how much you have to: 0-1 drink a day for women who are not pregnant. 0-2 drinks a day for men. Know how much alcohol is in a drink. In the [place] one drink equals one 12  oz bottle of beer (355 mL), one 5 oz glass of wine (148 mL), or one 1 oz glass of hard liquor (44 mL). General instructions Take over-the-counter and prescription medicines only as told by your doctor. Ask your doctor if you should avoid driving or using machines while you are taking your medicine. Return to your normal activities when your doctor says that it is safe. Keep all follow-up visits. Where to find more information [employer]: [email or web address]/ Contact a doctor if: You have another gout attack. You still have symptoms of a gout attack after 10 days of treatment. You have problems (side effects) because of your medicines. You have chills or a fever. You have burning pain when you pee (urinate). You have pain in your lower back or belly. Get help right away if: You have very bad pain. Your pain cannot be controlled. You cannot pee. Summary Gout is painful swelling of the joints. The most common site of pain is the big toe, but it can affect other joints. Medicines and  avoiding some foods can help to prevent and treat gout attacks. This information is not intended to replace advice given to you by your health care provider. Make sure you discuss any questions you have with your health care provider. Document Revised: 12/02/2020 Document Reviewed: 12/02/2020 Elsevier Patient Education  2024 Elsevier Inc.    If you have been instructed to have an in-person evaluation today at a local Urgent Care facility, please use the link below. It will take you to a list of all of our available [facility], including address, phone number and hours of operation. Please do not delay care.  [facility]  If you or a family member do not have a primary care provider, use the link below to schedule a visit and establish care. When you choose a [facility] primary care physician or advanced practice provider, you gain a long-term partner in health. Find a Primary Care Provider  Learn more about [facility] in-office and virtual care options: [facility] - Get Care Now

## 2022-12-16 ENCOUNTER — Other Ambulatory Visit (HOSPITAL_COMMUNITY): Payer: Self-pay

## 2022-12-19 ENCOUNTER — Other Ambulatory Visit (HOSPITAL_COMMUNITY): Payer: Self-pay

## 2022-12-19 MED ORDER — OMEPRAZOLE 20 MG PO CPDR
20.0000 mg | DELAYED_RELEASE_CAPSULE | Freq: Every day | ORAL | 0 refills | Status: AC | PRN
  Filled 2022-12-19: qty 90, 90d supply, fill #0

## 2022-12-21 ENCOUNTER — Other Ambulatory Visit (HOSPITAL_COMMUNITY): Payer: Self-pay

## 2022-12-26 ENCOUNTER — Encounter (INDEPENDENT_AMBULATORY_CARE_PROVIDER_SITE_OTHER): Payer: Self-pay | Admitting: Physician Assistant

## 2022-12-28 ENCOUNTER — Encounter (INDEPENDENT_AMBULATORY_CARE_PROVIDER_SITE_OTHER): Payer: Self-pay | Admitting: Physician Assistant

## 2022-12-28 ENCOUNTER — Other Ambulatory Visit (HOSPITAL_COMMUNITY): Payer: Self-pay

## 2022-12-28 ENCOUNTER — Other Ambulatory Visit: Payer: Self-pay

## 2022-12-28 ENCOUNTER — Ambulatory Visit (INDEPENDENT_AMBULATORY_CARE_PROVIDER_SITE_OTHER): Payer: Commercial Managed Care - PPO | Admitting: Physician Assistant

## 2022-12-28 VITALS — BP 103/67 | HR 80 | Temp 98.1°F | Ht 67.0 in | Wt 210.0 lb

## 2022-12-28 DIAGNOSIS — M109 Gout, unspecified: Secondary | ICD-10-CM

## 2022-12-28 DIAGNOSIS — E669 Obesity, unspecified: Secondary | ICD-10-CM

## 2022-12-28 DIAGNOSIS — E559 Vitamin D deficiency, unspecified: Secondary | ICD-10-CM

## 2022-12-28 DIAGNOSIS — I152 Hypertension secondary to endocrine disorders: Secondary | ICD-10-CM

## 2022-12-28 DIAGNOSIS — E1159 Type 2 diabetes mellitus with other circulatory complications: Secondary | ICD-10-CM | POA: Diagnosis not present

## 2022-12-28 DIAGNOSIS — Z6833 Body mass index (BMI) 33.0-33.9, adult: Secondary | ICD-10-CM

## 2022-12-28 DIAGNOSIS — E1165 Type 2 diabetes mellitus with hyperglycemia: Secondary | ICD-10-CM | POA: Diagnosis not present

## 2022-12-28 DIAGNOSIS — Z7985 Long-term (current) use of injectable non-insulin antidiabetic drugs: Secondary | ICD-10-CM

## 2022-12-28 MED ORDER — TIRZEPATIDE 7.5 MG/0.5ML ~~LOC~~ SOAJ
7.5000 mg | SUBCUTANEOUS | 0 refills | Status: DC
Start: 2022-12-28 — End: 2023-01-26
  Filled 2022-12-28: qty 2, 28d supply, fill #0

## 2022-12-28 NOTE — Progress Notes (Signed)
.smr  Office: [phone]  /  Fax: [phone]  WEIGHT SUMMARY AND BIOMETRICS  Vitals Temp: 98.1 F (36.7 C) BP: 103/67 Pulse Rate: 80 SpO2: 100 %   Anthropometric Measurements Height: 5\' 7"  (1.702 m) Weight: 210 lb (95.3 kg) BMI (Calculated): 32.88 Weight at Last Visit: 214 lb Weight Lost Since Last Visit: 4 lb Weight Gained Since Last Visit: 0 Starting Weight: 213 lb Total Weight Loss (lbs): 3 lb (1.361 kg) Peak Weight: 220 lb   Body Composition  Body Fat %: 43.6 % Fat Mass (lbs): 92 lbs Muscle Mass (lbs): 112.8 lbs Total Body Water (lbs): 78.2 lbs Visceral Fat Rating : 11   Other Clinical Data Fasting: yes Labs: no Today's Visit #: 21 Starting Date: 05/12/20     HPI  Chief Complaint: OBESITY  Kimberly Boyd is here to discuss her progress with her obesity treatment plan. She is on the the Category 1 Plan and practicing portion control and making smarter food choices, such as increasing vegetables and decreasing simple carbohydrates and states she is following her eating plan approximately 50 % of the time. She states she is exercising walking/weights 45 minutes 3 times per week. Discussed the use of AI scribe software for clinical note transcription with the patient, who gave verbal consent to proceed.  History of Present Illness     The patient, with a history of obesity, type two diabetes, hypertension, and vitamin D deficiency, presents for a follow-up of her obesity treatment plan.       Interval History:  Since last office visit she is down 4 lbs.  Bio impedence scale reviewed with the patient:  Muscle mass - 1 lb Adipose mass - 3 lbs.  Total body water -3 lbs  Feels appetite and hunger are increased over the past few weeks. Noted increased cravings.  The patient is due to go on a vacation to [place] and expresses concern about maintaining her diet and exercise routine during this period.Travel strategies discussed.    Pharmacotherapy: Mounjaro 5  mg weekly. Denies mass in neck, dysphagia, dyspepsia, persistent hoarseness, abdominal pain, or N/V/Constipation or diarrhea. Has annual eye exam. Mood is stable.    TREATMENT PLAN FOR OBESITY: Obesity Patient has lost 4 pounds/ 3 pounds of adipose tissue. However, she reports inconsistent appetite suppression. -Increase dose of Mounjaro and monitor for side effects such as nausea and fatigue. -Encourage consistent protein intake and hydration. -Consider increasing fiber intake with psyllium husk, starting with one tablet a day.  Recommended Dietary Goals  Kimberly Boyd is currently in the action stage of change. As such, her goal is to continue weight management plan. She has agreed to the Category 1 Plan and practicing portion control and making smarter food choices, such as increasing vegetables and decreasing simple carbohydrates.  Behavioral Intervention  We discussed the following Behavioral Modification Strategies today: staying on track while traveling and vacationing and continue to work on maintaining a reduced calorie state, getting the recommended amount of protein, incorporating whole foods, making healthy choices, staying well hydrated and practicing mindfulness when eating..  Additional resources provided today: NA  Recommended Physical Activity Goals  Kimberly Boyd has been advised to work up to 150 minutes of moderate intensity aerobic activity a week and strengthening exercises 2-3 times per week for cardiovascular health, weight loss maintenance and preservation of muscle mass.   She has agreed to Continue current level of physical activity  and Increase physical activity in their day and reduce sedentary time (increase NEAT).   Pharmacotherapy  We discussed various medication options to help Kimberly Boyd with her weight loss efforts and we both agreed to increase Mounjaro to 7.5 mg weekly.    Return in about 3 weeks (around 01/18/2023).Marland Kitchen She was informed of the importance of frequent  follow up visits to maximize her success with intensive lifestyle modifications for her multiple health conditions.  PHYSICAL EXAM:  Blood pressure 103/67, pulse 80, temperature 98.1 F (36.7 C), height 5\' 7"  (1.702 m), weight 210 lb (95.3 kg), SpO2 100%. Body mass index is 32.89 kg/m.  General: She is overweight, cooperative, alert, well developed, and in no acute distress. PSYCH: Has normal mood, affect and thought process.   Cardiovascular: HR 80's BP 103/67 Lungs: Normal breathing effort, no conversational dyspnea. Neuro: no focal deficit  DIAGNOSTIC DATA REVIEWED:  BMET    Component Value Date/Time   NA 139 10/26/2022 1030   K 4.1 10/26/2022 1030   CL 102 10/26/2022 1030   CO2 26 10/26/2022 1030   GLUCOSE 90 10/26/2022 1030   GLUCOSE 141 (H) 02/20/2021 0107   BUN 12 10/26/2022 1030   CREATININE 0.76 10/26/2022 1030   CREATININE 0.61 08/01/2013 1414   CALCIUM 10.0 10/26/2022 1030   GFRNONAA >60 02/20/2021 0107   GFRNONAA >89 08/01/2013 1414   GFRAA >90 11/26/2013 0758   GFRAA >89 08/01/2013 1414   Lab Results  Component Value Date   HGBA1C 6.5 (H) 10/26/2022   HGBA1C 6.2 (H) 05/12/2020   Lab Results  Component Value Date   INSULIN 7.9 10/26/2022   INSULIN 12.4 05/12/2020   Lab Results  Component Value Date   TSH 1.610 10/26/2022   CBC    Component Value Date/Time   WBC 4.2 10/26/2022 1030   WBC 6.7 02/20/2021 0107   RBC 4.30 10/26/2022 1030   RBC 4.04 02/20/2021 0107   HGB 13.2 10/26/2022 1030   HCT 39.4 10/26/2022 1030   PLT 252 10/26/2022 1030   MCV 92 10/26/2022 1030   MCH 30.7 10/26/2022 1030   MCH 30.9 02/20/2021 0107   MCHC 33.5 10/26/2022 1030   MCHC 34.2 02/20/2021 0107   RDW 13.5 10/26/2022 1030   Iron Studies    Component Value Date/Time   FERRITIN 55 11/18/2021 0751   Lipid Panel     Component Value Date/Time   CHOL 185 10/26/2022 1030   TRIG 52 10/26/2022 1030   HDL 68 10/26/2022 1030   LDLCALC 107 (H) 10/26/2022 1030    Hepatic Function Panel     Component Value Date/Time   PROT 7.9 10/26/2022 1030   ALBUMIN 4.3 10/26/2022 1030   AST 33 10/26/2022 1030   ALT 35 (H) 10/26/2022 1030   ALKPHOS 71 10/26/2022 1030   BILITOT 0.4 10/26/2022 1030      Component Value Date/Time   TSH 1.610 10/26/2022 1030   Nutritional Lab Results  Component Value Date   VD25OH 37.9 10/26/2022   VD25OH 36.3 11/24/2020   VD25OH 40.8 05/12/2020    ASSOCIATED CONDITIONS ADDRESSED TODAY  ASSESSMENT AND PLAN  Problem List Items Addressed This Visit     Hypertension associated with diabetes (HCC)   Relevant Medications   tirzepatide (MOUNJARO) 7.5 MG/0.5ML Pen   Generalized obesity   Relevant Medications   tirzepatide (MOUNJARO) 7.5 MG/0.5ML Pen   Podagra   Vitamin D insufficiency   Type 2 diabetes mellitus with hyperglycemia, without long-term current use of insulin (HCC) New diagnosis - Primary   Relevant Medications   tirzepatide (MOUNJARO) 7.5 MG/0.5ML Pen  Type 2 Diabetes Mellitus with other specified complication, without long-term current use of insulin HgbA1c is not at goal. Last A1c was 6.5  Medication(s): Mounjaro 5.0 mg SQ weekly Denies mass in neck, dysphagia, dyspepsia, persistent hoarseness, abdominal pain, or N/V/Constipation or diarrhea. Has annual eye exam. Mood is stable.  She is working  on nutrition plan to decrease simple carbohydrates, increase lean proteins and exercise to promote weight loss and improve glycemic control .  Lab Results  Component Value Date   HGBA1C 6.5 (H) 10/26/2022   HGBA1C 6.0 (H) 11/18/2021   HGBA1C 6.2 (H) 08/05/2021   Lab Results  Component Value Date   LDLCALC 107 (H) 10/26/2022   CREATININE 0.76 10/26/2022   No results found for: "GFR"  Plan: Continue and increase dose Mounjaro 7.5 mg SQ weekly Continue working  on nutrition plan to decrease simple carbohydrates, increase lean proteins and exercise to promote weight loss and improve glycemic  control.  Vitamin D Deficiency Vitamin D is not at goal of 50.  Most recent vitamin D level was 37.9, improved. PCP recommended OTC vitamin D. She is on OTC vitamin D3 2000 IU daily. Lab Results  Component Value Date   VD25OH 37.9 10/26/2022   VD25OH 36.3 11/24/2020   VD25OH 40.8 05/12/2020    Plan: Continue OTC vitamin D3 2000 IU daily Low vitamin D levels can be associated with adiposity and may result in leptin resistance and weight gain. Also associated with fatigue. Currently on vitamin D supplementation without any adverse effects.  Plan to recheck vitamin D level several times yearly to optimize supplementation/ avoid over supplementation.    Hypertension Well controlled with Triamterene 37.5-25mg  and also on propranolol for palpitations. Discussed the possibility of reducing dose due to potential blood pressure lowering effect of Mounjaro. -Monitor blood pressure at home and consider reducing Triamterene to 1/2 usual  dose if consistently low and keep log to review at next visit.    Gout Recent episode possibly related to increased protein intake and reduced hydration. -Increase hydration and adjust diet to reduce purine intake. -Continue current gout management per PCP and monitor closely.  Follow-up in 3 weeks to monitor progress and adjust treatment plan as necessary.  ATTESTASTION STATEMENTS:  Reviewed by clinician on day of visit: allergies, medications, problem list, medical history, surgical history, family history, social history, and previous encounter notes.   I have personally spent 41 minutes total time today in preparation, patient care, nutritional counseling and documentation for this visit, including the following: review of clinical lab tests; review of medical tests/procedures/services.      Elmira Olkowski, PA-C

## 2022-12-29 ENCOUNTER — Other Ambulatory Visit (HOSPITAL_COMMUNITY): Payer: Self-pay

## 2023-01-02 ENCOUNTER — Other Ambulatory Visit (HOSPITAL_COMMUNITY): Payer: Self-pay

## 2023-01-15 IMAGING — DX DG CHEST 2V
2 series · 2 of 2 positions shown · non-contrast
Comparison: 08/05/2011

CLINICAL DATA: Preop evaluation for right knee replacement

EXAM:
CHEST - 2 VIEW

[chest pa]
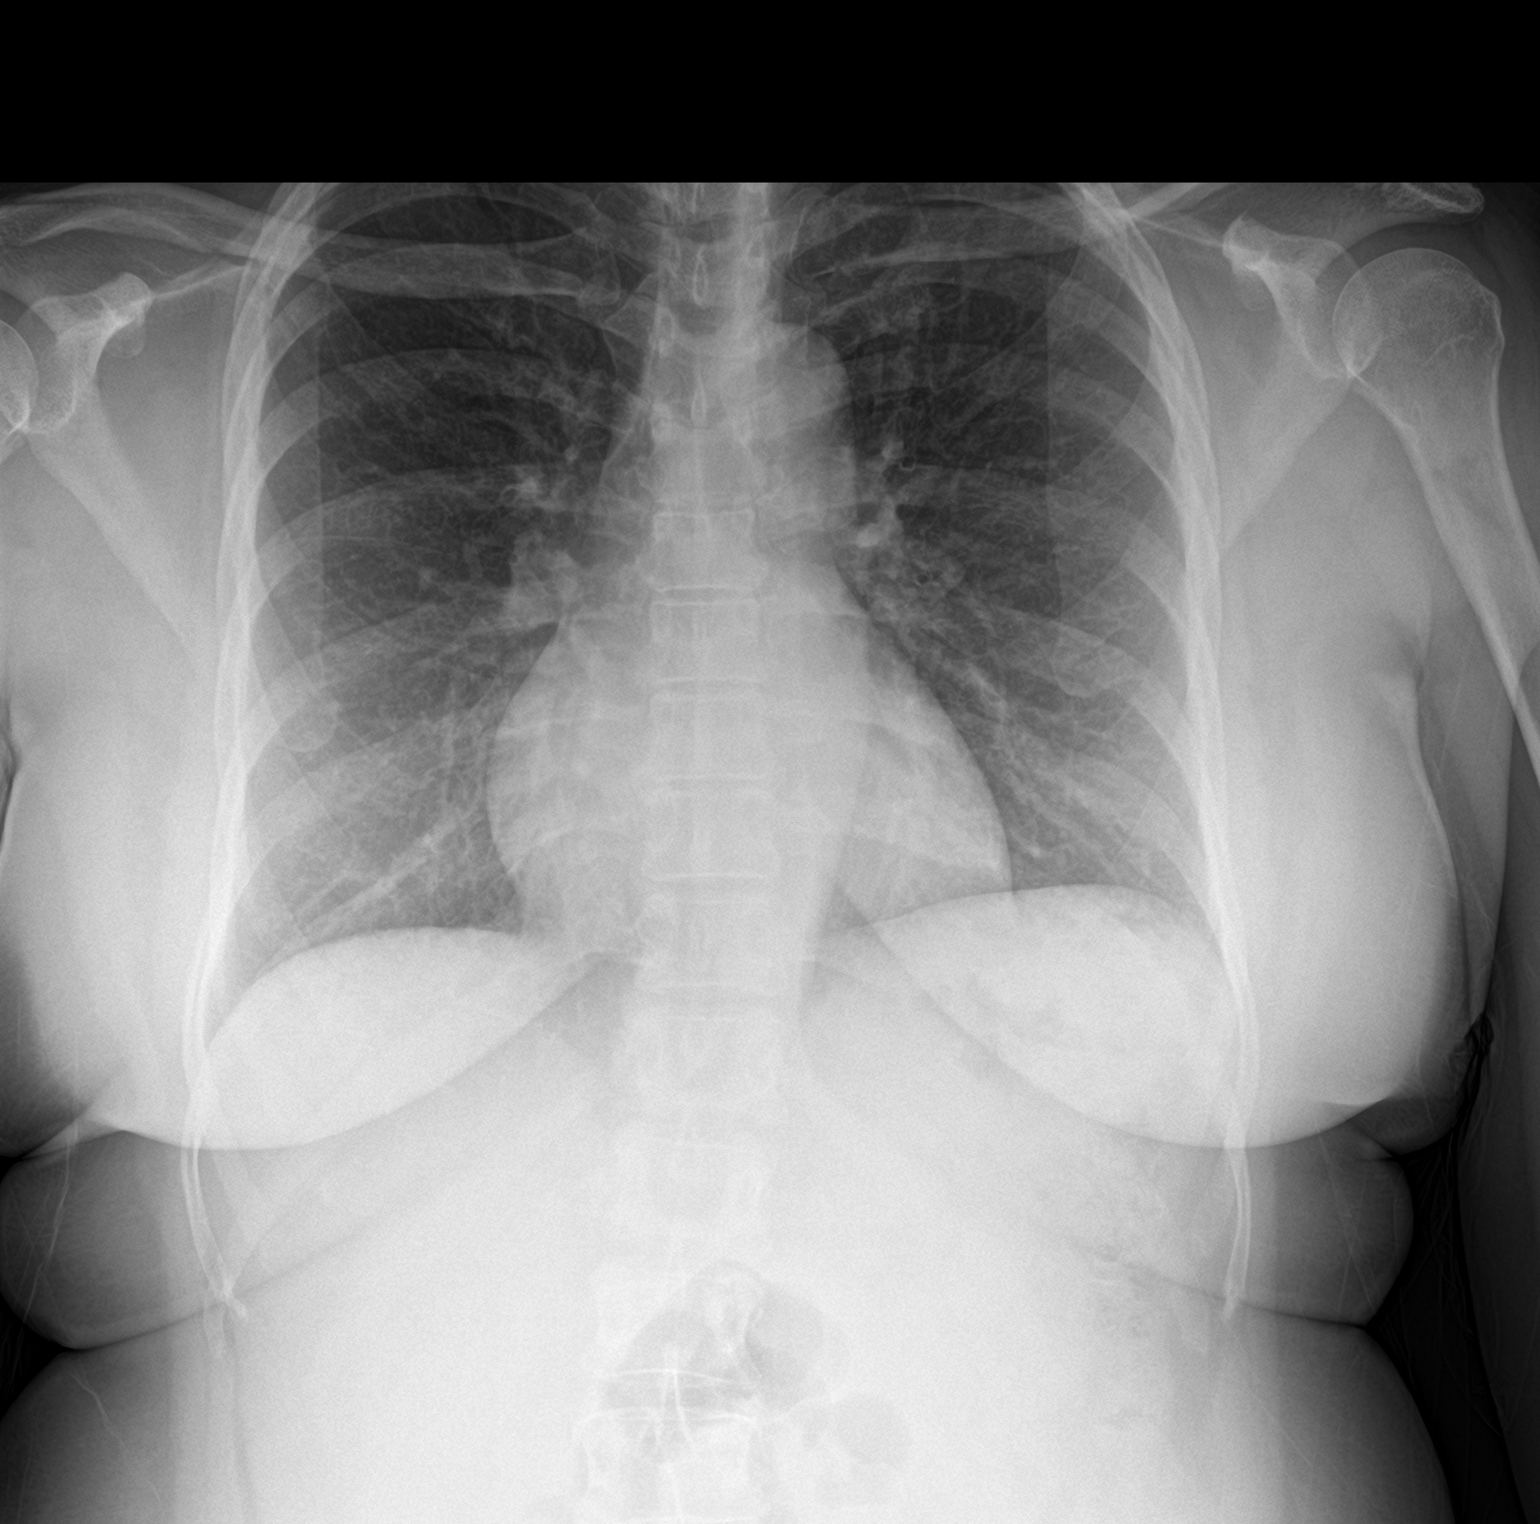

[chest lat]
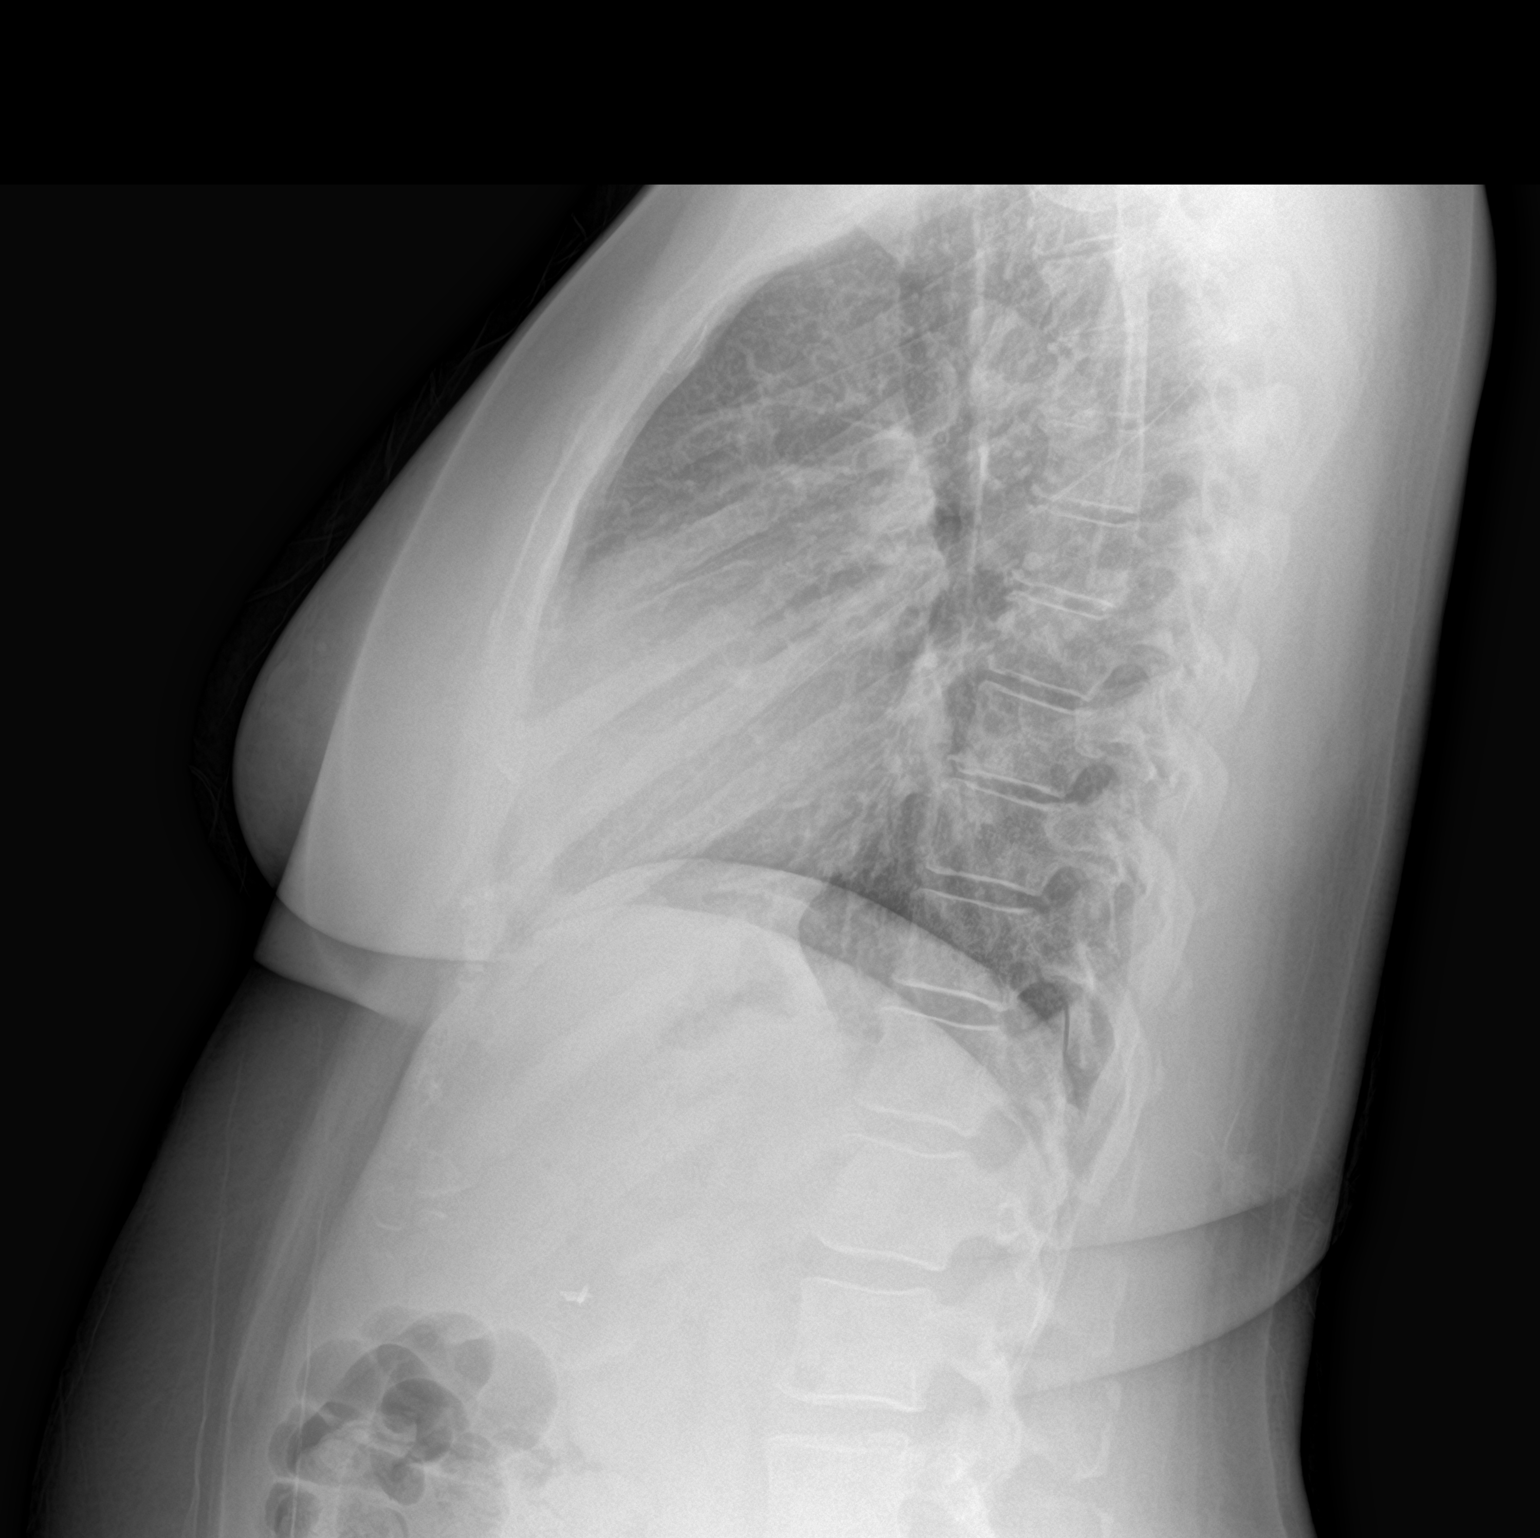

[2 of 2 positions shown; findings below may reference images not displayed]

FINDINGS: The heart size and mediastinal contours are within normal limits.
Both lungs are clear. The visualized skeletal structures are
unremarkable.
IMPRESSION: No active cardiopulmonary disease.

## 2023-01-18 ENCOUNTER — Ambulatory Visit (INDEPENDENT_AMBULATORY_CARE_PROVIDER_SITE_OTHER): Payer: Commercial Managed Care - PPO | Admitting: Physician Assistant

## 2023-01-26 ENCOUNTER — Encounter (INDEPENDENT_AMBULATORY_CARE_PROVIDER_SITE_OTHER): Payer: Self-pay | Admitting: Physician Assistant

## 2023-01-26 ENCOUNTER — Other Ambulatory Visit (HOSPITAL_COMMUNITY): Payer: Self-pay

## 2023-01-26 ENCOUNTER — Ambulatory Visit (INDEPENDENT_AMBULATORY_CARE_PROVIDER_SITE_OTHER): Payer: Commercial Managed Care - PPO | Admitting: Physician Assistant

## 2023-01-26 VITALS — BP 126/78 | HR 64 | Temp 97.8°F | Ht 67.0 in | Wt 206.0 lb

## 2023-01-26 DIAGNOSIS — E669 Obesity, unspecified: Secondary | ICD-10-CM | POA: Diagnosis not present

## 2023-01-26 DIAGNOSIS — Z7985 Long-term (current) use of injectable non-insulin antidiabetic drugs: Secondary | ICD-10-CM | POA: Diagnosis not present

## 2023-01-26 DIAGNOSIS — E1165 Type 2 diabetes mellitus with hyperglycemia: Secondary | ICD-10-CM

## 2023-01-26 DIAGNOSIS — I152 Hypertension secondary to endocrine disorders: Secondary | ICD-10-CM

## 2023-01-26 DIAGNOSIS — E559 Vitamin D deficiency, unspecified: Secondary | ICD-10-CM | POA: Diagnosis not present

## 2023-01-26 DIAGNOSIS — E1159 Type 2 diabetes mellitus with other circulatory complications: Secondary | ICD-10-CM | POA: Diagnosis not present

## 2023-01-26 DIAGNOSIS — M109 Gout, unspecified: Secondary | ICD-10-CM | POA: Diagnosis not present

## 2023-01-26 DIAGNOSIS — Z6832 Body mass index (BMI) 32.0-32.9, adult: Secondary | ICD-10-CM

## 2023-01-26 MED ORDER — TIRZEPATIDE 7.5 MG/0.5ML ~~LOC~~ SOAJ
7.5000 mg | SUBCUTANEOUS | 0 refills | Status: DC
  Filled 2023-01-26: qty 2, 28d supply, fill #0

## 2023-01-26 NOTE — Progress Notes (Signed)
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 She was recently diagnosed with type 2 diabetes and has a history of hypertension and vitamin D deficiency. The patient is currently on Mounjaro 7.5 mg weekly for diabetes management, triamterene-hydrochlorothiazide 37.5-25 mg daily for blood pressure control, propranolol 120 mg daily, and potassium chloride 10 mEq daily.  The patient reports a significant decrease in appetite since the increase in [place] dosage, consuming mostly protein-based meals. She is trying to meet her protein needs,  and we discussed goal of consuming approximately 75 grams per day.  She reports experiencing fatigue, particularly in the afternoon, and occasional nausea the day after her Mounjaro injection. We discussed that this should improve over the next few weeks.  She also notes a slight discomfort in her stomach, which she describes as a feeling of something not being quite right. She is having regular BM's with occasional use of Calm magnesium supplement up to twice weekly. She has no abdominal pain. No fevers, No RUQ tenderness. She has not had to take Linzess and this medication pre-dates her current treatment with [facility] for Type 2 diabetes.   The patient has been trying to maintain an active lifestyle, but admits to struggling with regular exercise and hydration. She reports a recent vacation and a change in season as factors affecting her exercise routine. She has exercise equipment at home and expresses a desire to get back into a regular exercise routine.  The patient also reports a recent episode of gout, which she believes may be related to her blood pressure medication and an increase in her consumption of red meat and shrimp for protein. She has been managing her gout by monitoring her diet and avoiding foods high in  purines. She has needed treatment with colchicine periodically.   Despite these challenges, the patient reports feeling better overall, with less inflammation and a noticeable difference in her clothes. She has maintained her muscle mass and has lost adipose tissue. She expresses a desire to continue with her current treatment plan and is scheduled for a follow-up visit in the coming months.  Marlowe is here to discuss her progress with her obesity treatment plan. She is on the Category 1 Plan and practicing portion control and making smarter food choices, such as increasing vegetables and decreasing simple carbohydrates and states she is following her eating plan approximately 70 % of the time. She states she is exercising walking 30-40 minutes 2 times per week.   OBJECTIVE: Visit Diagnoses: Problem List Items Addressed This Visit     Hypertension associated with diabetes (HCC)   Relevant Medications   tirzepatide (MOUNJARO) 7.5 MG/0.5ML Pen   Generalized obesity   Relevant Medications   tirzepatide (MOUNJARO) 7.5 MG/0.5ML Pen   Podagra   Vitamin D insufficiency   Type 2 diabetes mellitus with hyperglycemia, without long-term current use of insulin (HCC) New diagnosis - Primary   Relevant Medications   tirzepatide (MOUNJARO) 7.5 MG/0.5ML Pen   BMI 32.0-32.9,adult  Obesity Obesity management follow-up. On Mounjaro 7.5 mg weekly. Reports significant appetite suppression and weight loss (4.2 lbs adipose tissue). Muscle mass maintained. Mild nausea and fatigue likely related to hydration and protein intake. Discussed portion control strategies for holidays. Informed consent: Discussed risks (muscle mass loss, GI issues, dose adjustments) and benefits (weight loss, improved metabolic parameters). Alternatives: dietary modifications, other pharmacotherapies. - Refill/continue Mounjaro 7.5 mg weekly -  Encourage hydration (two Stanley 40 oz container of water daily) - Increase protein intake to 75  grams per day, consider protein drinks - Monitor for nausea, adjust hydration and protein intake as needed - Encourage regular exercise and establish a routine - Discuss holiday portion control strategies  Type 2 Diabetes Mellitus  Lab Results  Component Value Date   HGBA1C 6.5 (H) 10/26/2022   HGBA1C 6.0 (H) 11/18/2021   HGBA1C 6.2 (H) 08/05/2021   Lab Results  Component Value Date   LDLCALC 107 (H) 10/26/2022   CREATININE 0.76 10/26/2022   Recently diagnosed. Managed with Mounjaro 7.5 mg weekly.  Plan to recheck A1c in January.  Informed consent: Discussed benefits (HbA1c reduction, prevention of diabetic complications) and risks (GI side effects, hypoglycemia). Not on Statin therapy. Not on ACE or ARB.  Plan:  She is working  on nutrition plan to decrease simple carbohydrates, increase lean proteins and exercise to promote weight loss and improve glycemic control . Continue/Refill Mounjaro 7.5 mg weekly and monitor for SE.  - Schedule fasting labs and A1c/ Insulin/CMET check in January 2025  Hypertension :  Well-controlled on triamterene-hydrochlorothiazide 37.5-25 mg daily and propranolol 120 mg daily. Discussed potential need to adjust antihypertensive regimen as weight loss continues. - Continue current antihypertensive regimen Continue to work on nutrition plan to promote weight loss and improve BP control.    Gout Potentially exacerbated by blood pressure medication. Reports avoiding high-purine foods and managing symptoms. Discussed risks of gout flares with certain foods and medications. - Continue to avoid high-purine foods - Monitor for gout symptoms and adjust diet as needed  Vitamin D Insufficiency Last vitamin D Lab Results  Component Value Date   VD25OH 37.9 10/26/2022    Taking vitamin K2 and D3. Levels improved; recheck next year. - Continue vitamin K2 and D3 4000 units daily supplementation - Recheck vitamin D levels next year  General Health  Maintenance Discussed importance of hydration, regular exercise, and sun exposure for overall health. Encouraged balanced diet and avoiding high-purine foods to manage gout. - Encourage hydration - Encourage regular exercise - Encourage sun exposure for 10-15 minutes daily - Continue vitamin K2 and D3 supplementation  Follow-up - Schedule follow-up appointment on December 16th at 12:00 PM - Schedule follow-up appointment on January 13th at 8:00 AM for fasting labs.  Vitals Temp: 97.8 F (36.6 C) BP: 126/78 Pulse Rate: 64 SpO2: 98 %   Anthropometric Measurements Height: 5\' 7"  (1.702 m) Weight: 206 lb (93.4 kg) BMI (Calculated): 32.26 Weight at Last Visit: 210 lb Weight Lost Since Last Visit: 4 lb Weight Gained Since Last Visit: 0 Starting Weight: 213 lb Total Weight Loss (lbs): 7 lb (3.175 kg) Peak Weight: 220 lb   Body Composition  Body Fat %: 42.5 % Fat Mass (lbs): 87.8 lbs Muscle Mass (lbs): 112.6 lbs Total Body Water (lbs): 76.2 lbs Visceral Fat Rating : 11   Other Clinical Data Fasting: no Labs: no Today's Visit #: 22 Starting Date: 05/12/20   General: She is overweight, cooperative, alert, well developed, and in no acute distress. PSYCH: Has normal mood, affect and thought process.   Cardiovascular: HR 60's BP 126/78 Lungs: Normal breathing effort, no conversational dyspnea. Neuro: no focal deficit  ASSESSMENT AND PLAN:  Diet: Colbi is currently in the action stage of change. As such, her goal is to continue with weight loss efforts. She has agreed to Category 1 Plan and practicing portion control and making smarter food choices, such as increasing vegetables  and decreasing simple carbohydrates.  Exercise: Moriyah has been instructed to work up to a goal of 150 minutes of combined cardio and strengthening exercise per week for weight loss and overall health benefits.   Behavior Modification:  We discussed the following Behavioral Modification  Strategies today: increasing lean protein intake, decreasing simple carbohydrates, increasing vegetables, increase H2O intake, increase high fiber foods, no skipping meals, and holiday eating strategies. We discussed various medication options to help Mykeria with her weight loss efforts and we both agreed to continue Mounjaro 7.5 mg weekly for management of Type 2 diabetes.  Return in about 4 weeks (around 02/23/2023).Marland Kitchen She was informed of the importance of frequent follow up visits to maximize her success with intensive lifestyle modifications for her multiple health conditions.  Attestation Statements:   Reviewed by clinician on day of visit: allergies, medications, problem list, medical history, surgical history, family history, social history, and previous encounter notes.   Time spent on visit including pre-visit chart review and post-visit care and charting was 36 minutes.    Derril Franek, PA-C

## 2023-01-30 ENCOUNTER — Other Ambulatory Visit (HOSPITAL_COMMUNITY): Payer: Self-pay

## 2023-02-08 ENCOUNTER — Other Ambulatory Visit (HOSPITAL_COMMUNITY): Payer: Self-pay

## 2023-02-27 ENCOUNTER — Other Ambulatory Visit (HOSPITAL_COMMUNITY): Payer: Self-pay

## 2023-02-27 ENCOUNTER — Ambulatory Visit (INDEPENDENT_AMBULATORY_CARE_PROVIDER_SITE_OTHER): Payer: Commercial Managed Care - PPO | Admitting: Physician Assistant

## 2023-02-27 ENCOUNTER — Encounter (INDEPENDENT_AMBULATORY_CARE_PROVIDER_SITE_OTHER): Payer: Self-pay | Admitting: Physician Assistant

## 2023-02-27 VITALS — BP 110/73 | HR 65 | Temp 97.9°F | Ht 67.0 in | Wt 202.0 lb

## 2023-02-27 DIAGNOSIS — E669 Obesity, unspecified: Secondary | ICD-10-CM | POA: Diagnosis not present

## 2023-02-27 DIAGNOSIS — E559 Vitamin D deficiency, unspecified: Secondary | ICD-10-CM

## 2023-02-27 DIAGNOSIS — E1165 Type 2 diabetes mellitus with hyperglycemia: Secondary | ICD-10-CM | POA: Diagnosis not present

## 2023-02-27 DIAGNOSIS — Z7985 Long-term (current) use of injectable non-insulin antidiabetic drugs: Secondary | ICD-10-CM

## 2023-02-27 DIAGNOSIS — Z6831 Body mass index (BMI) 31.0-31.9, adult: Secondary | ICD-10-CM | POA: Diagnosis not present

## 2023-02-27 MED ORDER — TIRZEPATIDE 10 MG/0.5ML ~~LOC~~ SOAJ
10.0000 mg | SUBCUTANEOUS | 0 refills | Status: DC
Start: 2023-02-27 — End: 2023-03-27
  Filled 2023-02-27: qty 2, 28d supply, fill #0

## 2023-02-27 NOTE — Progress Notes (Signed)
SUBJECTIVE:  Chief Complaint: Obesity  Interim History: She is down 4 lbs since her last visit. Down 11 lbs overall .  TBW loss of 5.2% Kimberly Boyd, a 57 year old individual with a recent diagnosis of type 2 diabetes, hypertension, and vitamin D deficiency, presents with concerns about her dietary habits and the effectiveness of her current diabetes medication, Mounjaro. She reports an increase in consumption of sweets and questions whether the medication is working due to her perceived poor dietary habits. She has been on a 7.5 dose of Mounjaro for four weeks.  She also reports changes in her eating patterns and cravings. She has noticed an increase in her ability to consume larger quantities of food, even those not considered healthy, without feeling satiated. She also reports an increase in cravings, particularly for sweets.  In addition to her concerns about diabetes management, she also discusses her menopausal symptoms. She reports experiencing hot flashes, but overall feels that her symptoms have improved, attributing this improvement to weight loss associated with her diabetes medication.  She has had a decrease in muscle mass, which she attributes to a decrease in her weightlifting routine over the past two to three weeks. She acknowledges the need to reintegrate weightlifting into her routine to counteract this loss.  Lastly, she reports regular bowel movements, although she sometimes experiences strain. She manages this with magnesium citrate and a magnesium supplement called Calm. She also takes a daily vitamin D3K2 supplement. Kimberly Boyd is here to discuss her progress with her obesity treatment plan. She is on the Category 1 Plan and practicing portion control and making smarter food choices, such as increasing vegetables and decreasing simple carbohydrates and states she is following her eating plan approximately 50 % of the time. She states she is exercising walking 20-30 minutes 2  times per week.   OBJECTIVE: Visit Diagnoses: Problem List Items Addressed This Visit     Generalized obesity   Relevant Medications   tirzepatide (MOUNJARO) 10 MG/0.5ML Pen   Vitamin D insufficiency   Type 2 diabetes mellitus with hyperglycemia, without long-term current use of insulin (HCC) New diagnosis - Primary   Relevant Medications   tirzepatide (MOUNJARO) 10 MG/0.5ML Pen   BMI 31.0-31.9,adult   Obesity with new diagnosis of Type 2 Diabetes Mellitus Recently diagnosed, currently on Mounjaro 7.5 mg for four weeks. Reports increased cravings and difficulty adhering to dietary recommendations, especially during social events. Discussed increasing dose to 10 mg. Emphasized protein intake to manage cravings and prevent muscle mass loss. Reviewed potential side effects of Mounjaro, including vision disturbances (especially in those with diabetic retinopathy) and mood changes. Advised monitoring for these side effects and consulting an eye doctor if vision changes occur. Denies mass in neck, dysphagia, dyspepsia, persistent hoarseness, abdominal pain, or N/V/ or diarrhea. Has annual eye exam. Mood is stable.   - Increase/refill Mounjaro to 10 mg weekly and monitor response - Ensure adequate protein intake - Monitor for vision disturbances and mood changes - Encourage weightlifting to prevent muscle mass loss - Provide a list of high-protein foods    Vitamin D Deficiency Currently taking Vitamin D3K2 daily. Completed a four-week course of 50,000 units of vitamin D. - Order vitamin D level with next labs Low vitamin D levels can be associated with adiposity and may result in leptin resistance and weight gain. Also associated with fatigue. Currently on vitamin D supplementation without any adverse effects.   General Health Maintenance Discussed strategies for managing dietary intake during social events and  holidays. Emphasized protein loading before events to reduce cravings.  Recommended prepared high-protein meals as convenient options. - Encourage protein loading before social events - Recommend Kimberly Boyd Prepared Meats as high-protein meal options  Follow-up - Coordinate fasting labs with primary care provider - Request A1c, insulin level, lipids, and vitamin D level with next labs   Vitals Temp: 97.9 F (36.6 C) BP: 110/73 Pulse Rate: 65 SpO2: 100 %   Anthropometric Measurements Height: 5\' 7"  (1.702 m) Weight: 202 lb (91.6 kg) BMI (Calculated): 31.63 Weight at Last Visit: 206 lb Weight Lost Since Last Visit: 4 lb Weight Gained Since Last Visit: 0 Starting Weight: 213 lb Total Weight Loss (lbs): 11 lb (4.99 kg) Peak Weight: 220 lb   Body Composition  Body Fat %: 42.6 % Fat Mass (lbs): 86.2 lbs Muscle Mass (lbs): 110.4 lbs Total Body Water (lbs): 76.8 lbs Visceral Fat Rating : 11   Other Clinical Data Fasting: no Labs: no Today's Visit #: 23 Starting Date: 05/12/20     ASSESSMENT AND PLAN:  Diet: [name] is currently in the action stage of change. As such, her goal is to continue with weight loss efforts. She has agreed to Category 1 Plan and practicing portion control and making smarter food choices, such as increasing vegetables and decreasing simple carbohydrates.  Exercise: [name] has been instructed to work up to a goal of 150 minutes of combined cardio and strengthening exercise per week for weight loss and overall health benefits.   Behavior Modification:  We discussed the following Behavioral Modification Strategies today: increasing lean protein intake, decreasing simple carbohydrates, increasing vegetables, increase H2O intake, increase high fiber foods, no skipping meals, meal planning and cooking strategies, holiday eating strategies, and planning for success. We discussed various medication options to help [name] with her weight loss efforts and we both agreed to increase Mounjaro to 10 mg weekly for management of Type  2 diabetes.  Return in about 4 weeks (around 03/27/2023) for Fasting IC.Marland Kitchen She was informed of the importance of frequent follow up visits to maximize her success with intensive lifestyle modifications for her multiple health conditions.  Attestation Statements:   Reviewed by clinician on day of visit: allergies, medications, problem list, medical history, surgical history, family history, social history, and previous encounter notes.   Time spent on visit including pre-visit chart review and post-visit care and charting was 35 minutes.    Ashiah Karpowicz, PA-C

## 2023-02-28 DIAGNOSIS — D259 Leiomyoma of uterus, unspecified: Secondary | ICD-10-CM | POA: Diagnosis not present

## 2023-02-28 DIAGNOSIS — I10 Essential (primary) hypertension: Secondary | ICD-10-CM | POA: Diagnosis not present

## 2023-02-28 DIAGNOSIS — N951 Menopausal and female climacteric states: Secondary | ICD-10-CM | POA: Diagnosis not present

## 2023-02-28 DIAGNOSIS — Z01419 Encounter for gynecological examination (general) (routine) without abnormal findings: Secondary | ICD-10-CM | POA: Diagnosis not present

## 2023-03-26 NOTE — Progress Notes (Signed)
 SUBJECTIVE:  Chief Complaint: Obesity  Interim History: She is down 7 lbs since her last visit. Down 18 lbs overall TBW loss of 8.5% Carrisa Keller is a 58 year old female with a recent diagnosis of type 2 diabetes, hypertension, hyperlipidemia, and vitamin D  deficiency, presents for a follow-up visit regarding her obesity treatment plan. She has been on Mounjaro , which has resulted in a significant weight loss.  The patient reports being approximately 75% compliant with her meal plan, with a noted weight loss of seven pounds. She expresses satisfaction with the weight loss, noting a decrease in adipose tissue and maintenance of muscle mass. However, she reports some constipation, which she attributes to an increase in protein intake. She plans to manage this with increased water intake and possibly resuming Miralax.She also takes LInzess , but not on a regular basis.   The patient also reports poor sleep quality, waking up around 3:30-4:00 AM daily with difficulty returning to sleep due to an active mind. She has tried using magnesium  and a product called Calm to improve sleep, with variable success.  Regarding her diabetes management, the patient is hopeful that her A1C might be improved due to her weight loss and the effects of Mounjaro . She expresses curiosity about the long-term plan for her medication regimen, wondering if she will need to continue the medication indefinitely or if there will be changes in the dosage or frequency in the future.  The patient's goal is to reach a weight of 185 pounds, a weight she has previously achieved. She acknowledges the need for consistency in her sleep and dietary habits to achieve this goal. She expresses a desire to maintain her health throughout her weight loss journey.   Bonnell is here to discuss her progress with her obesity treatment plan. She is on the Category 1 Plan and practicing portion control and making smarter food choices, such as  increasing vegetables and decreasing simple carbohydrates and states she is following her eating plan approximately 75 % of the time. She states she is exercising walking 45 minutes 2-3 times per week.   OBJECTIVE: Visit Diagnoses: Problem List Items Addressed This Visit     Hypertension associated with diabetes (HCC)   Relevant Medications   tirzepatide  (MOUNJARO ) 10 MG/0.5ML Pen   Generalized obesity   Relevant Medications   tirzepatide  (MOUNJARO ) 10 MG/0.5ML Pen   Vitamin D  insufficiency   Type 2 diabetes mellitus with hyperglycemia, without long-term current use of insulin  (HCC) New diagnosis - Primary   Relevant Medications   tirzepatide  (MOUNJARO ) 10 MG/0.5ML Pen   Other Relevant Orders   CMP14+EGFR   Hemoglobin A1c   Insulin , random   Other Visit Diagnoses       Hyperlipidemia associated with type 2 diabetes mellitus (HCC)       Relevant Medications   tirzepatide  (MOUNJARO ) 10 MG/0.5ML Pen   Other Relevant Orders   Lipid Panel With LDL/HDL Ratio     Other fatigue       Relevant Orders   Vitamin B12   VITAMIN D  25 Hydroxy (Vit-D Deficiency, Fractures)     Sleep disorder, unspecified         BMI 30.0-30.9,adult Current BMI 30.7         Obesity Significant weight loss since starting Mounjaro . Current weight loss of 7 pounds with a reduction in adipose tissue and maintenance of muscle mass. Current adipose percentage is 41%, with a goal of 35% or less. Visceral adipose rating is 10, with a goal of 12  or less. Discussed long-term use of Mounjaro , potential remission of type 2 diabetes, and need for ongoing monitoring and dosage adjustment.  - Continue Mounjaro  10 mg weekly at current dose - Recheck fasting labs including electrolytes, kidney, liver function, ALT, LDL, vitamin D , and vitamin B12 - Increase fiber and water intake - Consider MiraLAX for constipation - Follow up on February 10th and March 10th  Type 2 Diabetes Mellitus Lab Results  Component Value Date    HGBA1C 6.5 (H) 10/26/2022   HGBA1C 6.0 (H) 11/18/2021   HGBA1C 6.2 (H) 08/05/2021   Lab Results  Component Value Date   LDLCALC 107 (H) 10/26/2022   CREATININE 0.76 10/26/2022    Recent diagnosis with significant improvement in weight and discussed potential remission following treatment with Mounjaro  and ongoing weight loss. Current treatment with Mounjaro  showing positive effects. Discussed potential remission and need for ongoing monitoring to determine long-term management strategies. - Check A1c and fasting insulin  levels Continue and refill Mounjaro  10 mg weekly - Monitor progress and consider adjusting Mounjaro  dosage or frequency  Hypertension Well-controlled with triamterene -hydrochlorothiazide  and propranolol  as needed for SVT and heart palpitations. Discussed potential reduction in medication dosage as weight loss continues. - Continue current medications - Monitor blood pressure and consider adjusting medications as may go lower with continued weight loss/treatment with Mounjaro .   Hyperlipidemia Not on statin therapy.  Not on ACE or ARB.  Continue to work on nutrition plan -decreasing simple carbohydrates, increasing lean proteins, decreasing saturated fats and cholesterol , avoiding trans fats and exercise as able to promote weight loss, improve lipids and decrease cardiovascular risks. Currently managed with monitoring and rechecking LDL levels. - Recheck lipid levels with fasting labs  Elevated ALT Slightly elevated ALT levels suggestive of fatty liver. Previous CT scan did not show fatty liver, but ongoing monitoring is necessary. Discussed potential benefits of Mounjaro /Zepbound  for liver function, currently under study. - Monitor liver function with fasting labs   Plan:  Lab results reviewed with patient.  Disease counseling done.  Intensive lifestyle modifications are the first line treatment for this issue.  We discussed several lifestyle modifications today and  she will continue to work on diet, exercise and weight loss efforts.   Counseling: NAFLD is an umbrella term that encompasses a disease spectrum that includes steatosis (fat) without inflammation, steatohepatitis (NASH; fat + inflammation in a characteristic pattern), and cirrhosis. Bland steatosis is felt to be a benign condition, with extremely low to no risk of progression to cirrhosis, whereas NASH can progress to cirrhosis. The mainstay of treatment of NAFLD includes lifestyle modification to achieve weight loss, at least 7% of current body weight. Low carbohydrate diets can be beneficial in improving NAFLD liver histology. Additionally, exercise, even the absence of weight loss can have beneficial effects on the patient's metabolic profile and liver health. We recommend that their metabolic comorbidities be aggressively managed, as patients with NAFLD are at increased risk of coronary artery disease.   Vitamin D  Deficiency  On OTC vitamin D  4000 units daily. No N/V or muscle weakness with vitamin D .  Currently managed with monitoring and rechecking vitamin D  levels. Still feels some fatigue, but also notes disrupted sleep patterns.  - Recheck vitamin D  levels with fasting labs and adjust supplementation to optimize condition. Low vitamin D  levels can be associated with adiposity and may result in leptin resistance and weight gain. Also associated with fatigue. Currently on vitamin D  supplementation without any adverse effects.    Disrupted sleep/ Fatigue  Discussion on overall health maintenance including sleep hygiene and mental health. Discussed techniques for improving sleep hygiene, such as cognitive techniques, magnesium  supplements, and keeping a journal by the bedside. - Improve sleep hygiene with consistent bedtime routine and cognitive techniques - Consider magnesium  supplements like Calm for better sleep and bowel function - Keep a journal by the bedside to reduce nighttime  rumination Recheck vitamin D  and B 12 levels as possible sources of fatigue in addition to disrupted sleep patterns.   Follow-up - Follow up on February 10th - Schedule another follow-up for March 10th at 8 AM.  Vitals Temp: 98.5 F (36.9 C) BP: 109/75 Pulse Rate: 76 SpO2: 98 %   Anthropometric Measurements Height: 5' 7 (1.702 m) Weight: 195 lb (88.5 kg) BMI (Calculated): 30.53 Weight at Last Visit: 202 lb Weight Lost Since Last Visit: 7 lb Weight Gained Since Last Visit: 0 Starting Weight: 213 lb Total Weight Loss (lbs): 18 lb (8.165 kg) Peak Weight: 220 lb   Body Composition  Body Fat %: 41.1 % Fat Mass (lbs): 80.4 lbs Muscle Mass (lbs): 109.6 lbs Total Body Water (lbs): 72.4 lbs Visceral Fat Rating : 10   Other Clinical Data Today's Visit #: 24 Starting Date: 05/12/20     ASSESSMENT AND PLAN:  Diet: Hadyn is currently in the action stage of change. As such, her goal is to continue with weight loss efforts. She has agreed to Category 1 Plan and practicing portion control and making smarter food choices, such as increasing vegetables and decreasing simple carbohydrates.  Exercise: Daylee has been instructed to work up to a goal of 150 minutes of combined cardio and strengthening exercise per week for weight loss and overall health benefits.   Behavior Modification:  We discussed the following Behavioral Modification Strategies today: increasing lean protein intake, decreasing simple carbohydrates, increasing vegetables, increase H2O intake, increase high fiber foods, no skipping meals, meal planning and cooking strategies, better snacking choices, avoiding temptations, and planning for success. We discussed various medication options to help Jorryn with her weight loss efforts and we both agreed to continue Mounjaro  10 mg weekly for Type 2 diabetes management.  Return in about 4 weeks (around 04/24/2023).SABRA She was informed of the importance of frequent follow  up visits to maximize her success with intensive lifestyle modifications for her multiple health conditions.  Attestation Statements:   Reviewed by clinician on day of visit: allergies, medications, problem list, medical history, surgical history, family history, social history, and previous encounter notes.   Time spent on visit including pre-visit chart review and post-visit care and charting was 48 minutes.    Dillyn Menna, PA-C

## 2023-03-27 ENCOUNTER — Ambulatory Visit (INDEPENDENT_AMBULATORY_CARE_PROVIDER_SITE_OTHER): Payer: Commercial Managed Care - PPO | Admitting: Physician Assistant

## 2023-03-27 ENCOUNTER — Other Ambulatory Visit (HOSPITAL_COMMUNITY): Payer: Self-pay

## 2023-03-27 ENCOUNTER — Encounter (INDEPENDENT_AMBULATORY_CARE_PROVIDER_SITE_OTHER): Payer: Self-pay | Admitting: Physician Assistant

## 2023-03-27 ENCOUNTER — Other Ambulatory Visit (HOSPITAL_BASED_OUTPATIENT_CLINIC_OR_DEPARTMENT_OTHER): Payer: Self-pay

## 2023-03-27 VITALS — BP 109/75 | HR 76 | Temp 98.5°F | Ht 67.0 in | Wt 195.0 lb

## 2023-03-27 DIAGNOSIS — I152 Hypertension secondary to endocrine disorders: Secondary | ICD-10-CM | POA: Diagnosis not present

## 2023-03-27 DIAGNOSIS — Z7985 Long-term (current) use of injectable non-insulin antidiabetic drugs: Secondary | ICD-10-CM

## 2023-03-27 DIAGNOSIS — E1159 Type 2 diabetes mellitus with other circulatory complications: Secondary | ICD-10-CM | POA: Diagnosis not present

## 2023-03-27 DIAGNOSIS — E1169 Type 2 diabetes mellitus with other specified complication: Secondary | ICD-10-CM | POA: Diagnosis not present

## 2023-03-27 DIAGNOSIS — E785 Hyperlipidemia, unspecified: Secondary | ICD-10-CM | POA: Diagnosis not present

## 2023-03-27 DIAGNOSIS — E669 Obesity, unspecified: Secondary | ICD-10-CM

## 2023-03-27 DIAGNOSIS — R5383 Other fatigue: Secondary | ICD-10-CM | POA: Diagnosis not present

## 2023-03-27 DIAGNOSIS — G479 Sleep disorder, unspecified: Secondary | ICD-10-CM

## 2023-03-27 DIAGNOSIS — Z6830 Body mass index (BMI) 30.0-30.9, adult: Secondary | ICD-10-CM | POA: Diagnosis not present

## 2023-03-27 DIAGNOSIS — E1165 Type 2 diabetes mellitus with hyperglycemia: Secondary | ICD-10-CM

## 2023-03-27 DIAGNOSIS — E559 Vitamin D deficiency, unspecified: Secondary | ICD-10-CM | POA: Diagnosis not present

## 2023-03-27 MED ORDER — TIRZEPATIDE 10 MG/0.5ML ~~LOC~~ SOAJ
10.0000 mg | SUBCUTANEOUS | 0 refills | Status: DC
  Filled 2023-03-27: qty 2, 28d supply, fill #0

## 2023-03-28 LAB — LIPID PANEL WITH LDL/HDL RATIO
Cholesterol, Total: 207 mg/dL — ABNORMAL HIGH (ref 100–199)
HDL: 65 mg/dL (ref >39)
LDL Chol Calc (NIH): 131 mg/dL — ABNORMAL HIGH (ref 0–99)
LDL/HDL Ratio: 2 {ratio} (ref 0.0–3.2)
Triglycerides: 61 mg/dL (ref 0–149)
VLDL Cholesterol Cal: 11 mg/dL (ref 5–40)

## 2023-03-28 LAB — CMP14+EGFR
ALT: 25 [IU]/L (ref 0–32)
AST: 21 [IU]/L (ref 0–40)
Albumin: 4.5 g/dL (ref 3.8–4.9)
Alkaline Phosphatase: 83 [IU]/L (ref 44–121)
BUN/Creatinine Ratio: 16 (ref 9–23)
BUN: 14 mg/dL (ref 6–24)
Bilirubin Total: 0.4 mg/dL (ref 0.0–1.2)
CO2: 26 mmol/L (ref 20–29)
Calcium: 10.2 mg/dL (ref 8.7–10.2)
Chloride: 98 mmol/L (ref 96–106)
Creatinine, Ser: 0.86 mg/dL (ref 0.57–1.00)
Globulin, Total: 3.9 g/dL (ref 1.5–4.5)
Glucose: 88 mg/dL (ref 70–99)
Potassium: 3.8 mmol/L (ref 3.5–5.2)
Sodium: 139 mmol/L (ref 134–144)
Total Protein: 8.4 g/dL (ref 6.0–8.5)
eGFR: 79 mL/min/{1.73_m2} (ref >59)

## 2023-03-28 LAB — VITAMIN D 25 HYDROXY (VIT D DEFICIENCY, FRACTURES): Vit D, 25-Hydroxy: 46.5 ng/mL (ref 30.0–100.0)

## 2023-03-28 LAB — HEMOGLOBIN A1C
Est. average glucose Bld gHb Est-mCnc: 117 mg/dL
Hgb A1c MFr Bld: 5.7 % — ABNORMAL HIGH (ref 4.8–5.6)

## 2023-03-28 LAB — VITAMIN B12: Vitamin B-12: 585 pg/mL (ref 232–1245)

## 2023-03-28 LAB — INSULIN, RANDOM: INSULIN: 7.2 u[IU]/mL (ref 2.6–24.9)

## 2023-04-17 DIAGNOSIS — F419 Anxiety disorder, unspecified: Secondary | ICD-10-CM | POA: Diagnosis not present

## 2023-04-17 DIAGNOSIS — R7303 Prediabetes: Secondary | ICD-10-CM | POA: Diagnosis not present

## 2023-04-17 DIAGNOSIS — R002 Palpitations: Secondary | ICD-10-CM | POA: Diagnosis not present

## 2023-04-17 DIAGNOSIS — E7800 Pure hypercholesterolemia, unspecified: Secondary | ICD-10-CM | POA: Diagnosis not present

## 2023-04-17 DIAGNOSIS — Z6831 Body mass index (BMI) 31.0-31.9, adult: Secondary | ICD-10-CM | POA: Diagnosis not present

## 2023-04-17 DIAGNOSIS — E66811 Obesity, class 1: Secondary | ICD-10-CM | POA: Diagnosis not present

## 2023-04-17 DIAGNOSIS — K5909 Other constipation: Secondary | ICD-10-CM | POA: Diagnosis not present

## 2023-04-17 DIAGNOSIS — I10 Essential (primary) hypertension: Secondary | ICD-10-CM | POA: Diagnosis not present

## 2023-04-23 NOTE — Progress Notes (Signed)
 [address] [place], [place], [place] [address] Office: [phone]  /  Fax: [phone]   WEIGHT SUMMARY AND BIOMETRICS  Vitals Temp: 98.7 F (37.1 C) BP: (!) 84/58 Pulse Rate: 88 SpO2: 98 %   Anthropometric Measurements Height: 5\' 7"  (1.702 m) Weight: 195 lb (88.5 kg) BMI (Calculated): 30.53 Weight at Last Visit: 195 lb Weight Lost Since Last Visit: 0 Weight Gained Since Last Visit: 0 Starting Weight: 213 lb Total Weight Loss (lbs): 18 lb (8.165 kg) Peak Weight: 220 lb   Body Composition  Body Fat %: 42 % Fat Mass (lbs): 82.2 lbs Muscle Mass (lbs): 107.8 lbs Total Body Water (lbs): 74.6 lbs Visceral Fat Rating : 10     No data recorded Today's Visit #: 25   Starting Date: 05/12/20  Peak Weight: 220 lbs Start Date: 05/12/2020 Starting Weight : 213 lbs  Current Weight: 195 lbs.  Working BMR: 30.53 Weight loss goal: 185 lbs Current Nutritional: portion control, balanced plate and making smarter food choices, such as increasing vegetables, protein intake and reducing simple carbohydrates and processed foods  Past Nutritional: the Category 1 plan - 1000 kcal per day Past Pharmacotherapy: Previously on metformin  for insulin  resistance/prediabetes.  Briefly on Wegovy  in the past with good result- stopped as no insurance coverage.  Current Pharmacotherapy: Mounjaro  10 mg weekly Contributing factors: Moderate to high levels of stress, Reduced physical activity, Eating patterns, and Menopause Emotional eating tendencies present : No Screened for sleep apnea in past : No Associated comorbid conditions: Hypertension, Hyperlipidemia, Diabetes, Tachyarrhythmia, and Vitamin D  Deficiency Last updated: 04/24/23 by : SMR   Chief Complaint: OBESITY  HPI  Since last office visit she has maintained weight. She reports variable adherence to reduced calorie nutritional plan She has been working on reading food labels, not skipping meals, increasing protein intake at every  meal, drinking more water, making healthier choices, reducing portion sizes, increased physical activity levels, and incorporating more whole foods   Discussed the use of AI scribe software for clinical note transcription with the patient, who gave verbal consent to proceed.  History of Present Illness       Kimberly Boyd "Kimberly Boyd" is a 58 year old female with obesity who presents for follow-up regarding obesity management.  She is currently on Mounjaro  10 mg weekly for obesity management. Despite this, her weight has not decreased recently, and she has experienced an increase in appetite, particularly after social events where she consumes larger meals.  She has type 2 diabetes, with a recent A1c of 5.7, and is pleased with this level. She is on Mounjaro  10 mg weekly for diabetes management. Her blood pressure has been low, with a recent reading of 84, and she is considering adjusting her triamterene  hydrochlorothiazide  dosage. She takes Inderal  LA 120 mg daily for SVT and experiences palpitations if she misses a dose.  She has hypertension and is on triamterene  hydrochlorothiazide  37.5-25 mg once daily. She reports low blood pressure readings and is considering reducing her dosage. She takes Inderal  LA 120 mg daily for SVT to manage palpitations.  She has hyperlipidemia and notes a slight increase in cholesterol levels, which she attributes to dietary changes over the holidays. She is mindful of her diet and is considering reducing red meat intake to manage her cholesterol levels.  She experiences chronic constipation and uses Linzess  episodically. She manages her symptoms with magnesium , Calm, or Miralax before resorting to Linzess .  She reports intermittent spotting and cramping after taking a morning-after  pill a couple of months ago. She has not had regular periods for some time, indicating a transition towards menopause. She plans to follow up with her OB GYN regarding these  symptoms.  No symptoms of low blood pressure such as lightheadedness or dizziness. Intermittent spotting and cramping, particularly in her back, requiring Tylenol  for relief.      Barriers identified: none.   Pharmacotherapy for weight loss: She is currently taking Monjauro with diabetes as the primary indication with adequate clinical response  and without side effects..    ASSESSMENT AND PLAN  TREATMENT PLAN FOR OBESITY:  Recommended Dietary Goals  Kimberly Boyd is currently in the action stage of change. As such, her goal is to continue weight management plan. She has agreed to: continue current plan  Behavioral Intervention  We discussed the following Behavioral Modification Strategies today: continue to work on maintaining a reduced calorie state, getting the recommended amount of protein, incorporating whole foods, making healthy choices, staying well hydrated and practicing mindfulness when eating..  Additional resources provided today: None  Recommended Physical Activity Goals  Kimberly Boyd has been advised to work up to 150 minutes of moderate intensity aerobic activity a week and strengthening exercises 2-3 times per week for cardiovascular health, weight loss maintenance and preservation of muscle mass.   She has agreed to :  Think about enjoyable ways to increase daily physical activity and overcoming barriers to exercise and Increase physical activity in their day and reduce sedentary time (increase NEAT).  Pharmacotherapy We discussed various medication options to help Kimberly Boyd with her weight loss efforts and we both agreed to : increase Mounjaro  to 12.5 mg weekly for Type 2 diabetes  ASSOCIATED CONDITIONS ADDRESSED TODAY  Type 2 diabetes mellitus with hyperglycemia, without long-term current use of insulin  (HCC) -     Tirzepatide ; Inject 12.5 mg into the skin once a week.  Dispense: 2 mL; Refill: 1 -     Basic metabolic panel  Hypertension associated with diabetes (HCC) -      Triamterene -HCTZ; Take 0.5 tablets by mouth every morning.  Dispense: 30 tablet; Refill: 3  Hyperlipidemia associated with type 2 diabetes mellitus (HCC)  Vitamin D  insufficiency  Abnormal uterine bleeding (AUB)  Generalized obesity Start BMI 33.36 05/12/2020  BMI 30.0-30.9,adult Current BMI 30.7    Assessment and Plan          Obesity 58 year old female with type 2 diabetes, hypertension, hyperlipidemia, and vitamin D  deficiency. Currently on Mounjaro  10 mg weekly. Reports increased appetite and no recent weight loss. Plan to increase Mounjaro  to 12.5 mg as reports less appetite suppression/increased hunger over the past month.  - Increase Mounjaro  to 12.5 mg weekly - Encourage increased exercise, including weight training - Monitor weight and appetite  Type 2 Diabetes Mellitus A1c is well-controlled at 5.7%. On Mounjaro  10 mg weekly, to be increased to 12.5 mg for weight and appetite management. No additional changes needed. Discussed potential long-term use and future adjustments. - Increase Mounjaro  to 12.5 mg weekly - Continue monitoring A1c levels She is working  on nutrition plan to decrease simple carbohydrates, increase lean proteins and exercise to promote weight loss and improve glycemic control .  Hypertension Blood pressure has been low, likely due to Mounjaro  treatment with weight loss of 18 lbs. . On triamterene  hydrochlorothiazide  37.5/25 mg once daily and Inderal  LA 120 mg once daily for SVT/palpitations.  Plan to reduce triamterene  hydrochlorothiazide  to half a tablet daily and monitor blood pressure. Potential to discontinue if blood  pressure remains low. She is asymptomatic and feels well. No dizziness, no SOB, no chest pain, no palpitations or other symptoms of hypotension or illness.  Recheck renal function and electrolytes today.  - Reduce triamterene  hydrochlorothiazide  to half a tablet daily - Monitor blood pressure at home - Check blood pressure at  different times of the day - Consider discontinuing triamterene  hydrochlorothiazide  if blood pressure remains low Continue to work on nutrition plan to promote weight loss and improve BP control.   Hyperlipidemia Recent cholesterol levels increased, possibly due to dietary changes. Consuming more red meat. HDL levels remain high. Plan to monitor cholesterol and encourage a leaner diet. Discussed reducing red meat intake and focusing on other protein sources. - Monitor cholesterol levels - Encourage a leaner diet with less red meat  ?Postmenopausal Bleeding/AUB Intermittent spotting and cramping following morning-after pill use. Likely postmenopausal with a history of irregular periods. Plan to follow up with OB/GYN for further evaluation. Discussed potential need for further evaluation and possible treatments. - Follow up with OB/GYN for evaluation of postmenopausal bleeding  Chronic Constipation History of chronic constipation, uses Linzess  rarely/episodically. Manages with magnesium - Calm, or Miralax before resorting to Linzess . Discussed importance of hydration . - Continue current management with magnesium , Calm, or Miralax - Use Linzess  as a last resort  Vitamin D  insufficiency/General Health Maintenance Vitamin D  and B12 levels are well-managed. Advised to maintain hydration and increase fluid intake to at least 80 ounces per day. Discussed importance of regular exercise and balanced diet. - Maintain hydration with at least 80 ounces of fluid per day - Continue vitamin D3 and K2 supplementation  Follow-up - Follow-up appointment on March 10th at 8:00 AM - Schedule follow-up appointment for April.     PHYSICAL EXAM:  Blood pressure (!) 84/58, pulse 88, temperature 98.7 F (37.1 C), height 5\' 7"  (1.702 m), weight 195 lb (88.5 kg), SpO2 98%. Body mass index is 30.54 kg/m.  General: She is overweight, cooperative, alert, well developed, and in no acute distress. PSYCH: Has  normal mood, affect and thought process.   HEENT: EOMI, sclerae are anicteric. Lungs: Normal breathing effort, no conversational dyspnea. Extremities: No edema.  Neurologic: No gross sensory or motor deficits. No tremors or fasciculations noted.    DIAGNOSTIC DATA REVIEWED:  BMET    Component Value Date/Time   NA 139 03/27/2023 0845   K 3.8 03/27/2023 0845   CL 98 03/27/2023 0845   CO2 26 03/27/2023 0845   GLUCOSE 88 03/27/2023 0845   GLUCOSE 141 (H) 02/20/2021 0107   BUN 14 03/27/2023 0845   CREATININE 0.86 03/27/2023 0845   CREATININE 0.61 08/01/2013 1414   CALCIUM 10.2 03/27/2023 0845   GFRNONAA >60 02/20/2021 0107   GFRNONAA >89 08/01/2013 1414   GFRAA >90 11/26/2013 0758   GFRAA >89 08/01/2013 1414   Lab Results  Component Value Date   HGBA1C 5.7 (H) 03/27/2023   HGBA1C 6.5 (H) 10/26/2022   HGBA1C 6.0 (H) 11/18/2021   HGBA1C 6.2 (H) 08/05/2021   HGBA1C 6.0 (H) 02/12/2021   Lab Results  Component Value Date   INSULIN  7.2 03/27/2023   INSULIN  7.9 10/26/2022   INSULIN  6.6 11/18/2021   INSULIN  7.2 08/05/2021   INSULIN  7.4 11/24/2020   Lab Results  Component Value Date   TSH 1.610 10/26/2022   CBC    Component Value Date/Time   WBC 4.2 10/26/2022 1030   WBC 6.7 02/20/2021 0107   RBC 4.30 10/26/2022 1030   RBC 4.04 02/20/2021  0107   HGB 13.2 10/26/2022 1030   HCT 39.4 10/26/2022 1030   PLT 252 10/26/2022 1030   MCV 92 10/26/2022 1030   MCH 30.7 10/26/2022 1030   MCH 30.9 02/20/2021 0107   MCHC 33.5 10/26/2022 1030   MCHC 34.2 02/20/2021 0107   RDW 13.5 10/26/2022 1030   Iron Studies    Component Value Date/Time   FERRITIN 55 11/18/2021 0751   Lipid Panel     Component Value Date/Time   CHOL 207 (H) 03/27/2023 0845   TRIG 61 03/27/2023 0845   HDL 65 03/27/2023 0845   LDLCALC 131 (H) 03/27/2023 0845   Lab Results  Component Value Date   CHOL 207 (H) 03/27/2023   HDL 65 03/27/2023   LDLCALC 131 (H) 03/27/2023   TRIG 61 03/27/2023    Hepatic Function Panel     Component Value Date/Time   PROT 8.4 03/27/2023 0845   ALBUMIN 4.5 03/27/2023 0845   AST 21 03/27/2023 0845   ALT 25 03/27/2023 0845   ALKPHOS 83 03/27/2023 0845   BILITOT 0.4 03/27/2023 0845      Component Value Date/Time   TSH 1.610 10/26/2022 1030   Nutritional Lab Results  Component Value Date   VD25OH 46.5 03/27/2023   VD25OH 37.9 10/26/2022   VD25OH 36.3 11/24/2020     Return in about 4 weeks (around 05/22/2023).Aaron Aas She was informed of the importance of frequent follow up visits to maximize her success with intensive lifestyle modifications for her multiple health conditions.   ATTESTASTION STATEMENTS:  Reviewed by clinician on day of visit: allergies, medications, problem list, medical history, surgical history, family history, social history, and previous encounter notes.     Sabatino Williard,PA-C

## 2023-04-24 ENCOUNTER — Ambulatory Visit (INDEPENDENT_AMBULATORY_CARE_PROVIDER_SITE_OTHER): Payer: Commercial Managed Care - PPO | Admitting: Physician Assistant

## 2023-04-24 ENCOUNTER — Encounter (INDEPENDENT_AMBULATORY_CARE_PROVIDER_SITE_OTHER): Payer: Self-pay | Admitting: Physician Assistant

## 2023-04-24 ENCOUNTER — Other Ambulatory Visit (HOSPITAL_COMMUNITY): Payer: Self-pay

## 2023-04-24 VITALS — BP 84/58 | HR 88 | Temp 98.7°F | Ht 67.0 in | Wt 195.0 lb

## 2023-04-24 DIAGNOSIS — E669 Obesity, unspecified: Secondary | ICD-10-CM

## 2023-04-24 DIAGNOSIS — I152 Hypertension secondary to endocrine disorders: Secondary | ICD-10-CM | POA: Diagnosis not present

## 2023-04-24 DIAGNOSIS — E785 Hyperlipidemia, unspecified: Secondary | ICD-10-CM

## 2023-04-24 DIAGNOSIS — E559 Vitamin D deficiency, unspecified: Secondary | ICD-10-CM | POA: Diagnosis not present

## 2023-04-24 DIAGNOSIS — Z7985 Long-term (current) use of injectable non-insulin antidiabetic drugs: Secondary | ICD-10-CM

## 2023-04-24 DIAGNOSIS — N939 Abnormal uterine and vaginal bleeding, unspecified: Secondary | ICD-10-CM

## 2023-04-24 DIAGNOSIS — E1169 Type 2 diabetes mellitus with other specified complication: Secondary | ICD-10-CM

## 2023-04-24 DIAGNOSIS — E1165 Type 2 diabetes mellitus with hyperglycemia: Secondary | ICD-10-CM | POA: Diagnosis not present

## 2023-04-24 DIAGNOSIS — K5909 Other constipation: Secondary | ICD-10-CM | POA: Diagnosis not present

## 2023-04-24 DIAGNOSIS — E1159 Type 2 diabetes mellitus with other circulatory complications: Secondary | ICD-10-CM | POA: Diagnosis not present

## 2023-04-24 DIAGNOSIS — Z6830 Body mass index (BMI) 30.0-30.9, adult: Secondary | ICD-10-CM | POA: Diagnosis not present

## 2023-04-24 MED ORDER — TIRZEPATIDE 12.5 MG/0.5ML ~~LOC~~ SOAJ
12.5000 mg | SUBCUTANEOUS | 1 refills | Status: DC
  Filled 2023-04-24 (×2): qty 2, 28d supply, fill #0

## 2023-04-24 MED ORDER — TRIAMTERENE-HCTZ 37.5-25 MG PO TABS
0.5000 | ORAL_TABLET | Freq: Every morning | ORAL | 3 refills | Status: DC
  Filled 2023-04-24: qty 30, 60d supply, fill #0
  Filled 2023-06-13: qty 30, 60d supply, fill #1
  Filled 2023-08-04: qty 30, 60d supply, fill #2

## 2023-04-25 ENCOUNTER — Encounter (INDEPENDENT_AMBULATORY_CARE_PROVIDER_SITE_OTHER): Payer: Self-pay | Admitting: Physician Assistant

## 2023-04-25 LAB — BASIC METABOLIC PANEL
BUN/Creatinine Ratio: 23 (ref 9–23)
BUN: 19 mg/dL (ref 6–24)
CO2: 25 mmol/L (ref 20–29)
Calcium: 10.1 mg/dL (ref 8.7–10.2)
Chloride: 101 mmol/L (ref 96–106)
Creatinine, Ser: 0.83 mg/dL (ref 0.57–1.00)
Glucose: 90 mg/dL (ref 70–99)
Potassium: 3.7 mmol/L (ref 3.5–5.2)
Sodium: 141 mmol/L (ref 134–144)
eGFR: 82 mL/min/{1.73_m2} (ref >59)

## 2023-05-15 DIAGNOSIS — H5203 Hypermetropia, bilateral: Secondary | ICD-10-CM | POA: Diagnosis not present

## 2023-05-16 DIAGNOSIS — N951 Menopausal and female climacteric states: Secondary | ICD-10-CM | POA: Diagnosis not present

## 2023-05-22 ENCOUNTER — Other Ambulatory Visit (HOSPITAL_COMMUNITY): Payer: Self-pay

## 2023-05-22 ENCOUNTER — Encounter (INDEPENDENT_AMBULATORY_CARE_PROVIDER_SITE_OTHER): Payer: Self-pay | Admitting: Physician Assistant

## 2023-05-22 ENCOUNTER — Ambulatory Visit (INDEPENDENT_AMBULATORY_CARE_PROVIDER_SITE_OTHER): Payer: Commercial Managed Care - PPO | Admitting: Physician Assistant

## 2023-05-22 VITALS — BP 110/74 | HR 74 | Temp 98.2°F | Ht 67.0 in | Wt 193.0 lb

## 2023-05-22 DIAGNOSIS — Z6830 Body mass index (BMI) 30.0-30.9, adult: Secondary | ICD-10-CM

## 2023-05-22 DIAGNOSIS — Z7985 Long-term (current) use of injectable non-insulin antidiabetic drugs: Secondary | ICD-10-CM | POA: Diagnosis not present

## 2023-05-22 DIAGNOSIS — E1169 Type 2 diabetes mellitus with other specified complication: Secondary | ICD-10-CM | POA: Diagnosis not present

## 2023-05-22 DIAGNOSIS — E785 Hyperlipidemia, unspecified: Secondary | ICD-10-CM

## 2023-05-22 DIAGNOSIS — E1165 Type 2 diabetes mellitus with hyperglycemia: Secondary | ICD-10-CM | POA: Diagnosis not present

## 2023-05-22 DIAGNOSIS — I152 Hypertension secondary to endocrine disorders: Secondary | ICD-10-CM

## 2023-05-22 DIAGNOSIS — E1159 Type 2 diabetes mellitus with other circulatory complications: Secondary | ICD-10-CM

## 2023-05-22 DIAGNOSIS — E669 Obesity, unspecified: Secondary | ICD-10-CM

## 2023-05-22 MED ORDER — TIRZEPATIDE 12.5 MG/0.5ML ~~LOC~~ SOAJ
12.5000 mg | SUBCUTANEOUS | 1 refills | Status: DC
  Filled 2023-05-22: qty 2, 28d supply, fill #0

## 2023-05-22 NOTE — Progress Notes (Signed)
 SUBJECTIVE:  Chief Complaint: Obesity  Interim History: She is down 2 lbs since last visit.  Down 20 lbs overall TBW loss of 9.4%  Kimberly Boyd is here to discuss her progress with her obesity treatment plan. She is on the Category 1 Plan and states she is following her eating plan approximately 65 % of the time. She states she is exercising walking 30  minutes 3 times per week.  She has been experiencing dizziness, particularly on Saturday, which she attributes to possible dehydration and allergies. These dizzy spells have occurred before, especially with seasonal changes, and are concerning to both her and her husband.  She is managing her blood pressure with half a tablet of triamterene hydrochlorothiazide and propranolol. Her blood pressure was in the 90s over 70s last week, leading her to skip the medication for a couple of days. However, her blood pressure then increased to 138/95, prompting her to resume the medication. She acknowledges not being well-hydrated during the days when her blood pressure was elevated.  She has noticed a weight loss of almost three pounds in adipose tissue and a slight decrease in overall weight since her last visit.  Her A1c has dropped significantly to 5.7% from 6.6%, indicating good control of her diabetes. She is on [facility] for diabetes management.  She discusses her cholesterol levels with concerns about continued LDL elevations but also noting a high HDL of 65, and confirms she is not on any medication for cholesterol. She is not postmenopausal.  OBJECTIVE: Visit Diagnoses: Problem List Items Addressed This Visit     Hypertension associated with diabetes (HCC)   Relevant Medications   tirzepatide (MOUNJARO) 12.5 MG/0.5ML Pen   Generalized obesity   Relevant Medications   tirzepatide (MOUNJARO) 12.5 MG/0.5ML Pen   Type 2 diabetes mellitus with hyperglycemia, without long-term current use of insulin (HCC) New diagnosis - Primary   Relevant  Medications   tirzepatide (MOUNJARO) 12.5 MG/0.5ML Pen   Other Visit Diagnoses       Hyperlipidemia associated with type 2 diabetes mellitus (HCC)       Relevant Medications   tirzepatide (MOUNJARO) 12.5 MG/0.5ML Pen     BMI 30.0-30.9,adult Current BMI 30.3          Obesity  Has done well with weight loss . TBW loss of 9.4% mainly since Oct. 2024 Started tirzepatide 10/2022 for new Dx T2DM. Loss of ~ 10 lbs muscle mass overall/~10 lbs of adipose mass since starting Mounjaro.  Denies mass in neck, dysphagia, dyspepsia, persistent hoarseness, abdominal pain, or N/V/Constipation or diarrhea. Has annual eye exam. Mood is stable.  Continue Mounjaro 12.5 mg weekly and plan to recheck labs at visit in May.  Discussed working on some strength training or HIIT type exercise to promote continued loss of adipose mass while preserving muscle mass.  Goal weight 185 lbs.   Hypertension Hypertension is managed with triamterene hydrochlorothiazide and propranolol. Blood pressure increased to 138/95 after discontinuing triamterene hydrochlorothiazide for a few days. Dizziness episodes may be related to dehydration and treatment with Mounjaro. Considering management with propranolol alone due to its mild effect on blood pressure. Adequate hydration is emphasized to prevent volume depletion, which can exacerbate dizziness and affect blood pressure control. Lab Results  Component Value Date   NA 141 04/24/2023   CL 101 04/24/2023   K 3.7 04/24/2023   CO2 25 04/24/2023   BUN 19 04/24/2023   CREATININE 0.83 04/24/2023   EGFR 82 04/24/2023   CALCIUM 10.1 04/24/2023  ALBUMIN 4.5 03/27/2023   GLUCOSE 90 04/24/2023    - Monitor blood pressure closely and keep log and bring in to next visit.  - Consider taking triamterene hydrochlorothiazide every other day - Ensure adequate hydration - Continue propranolol for palpitations.   Dizziness Dizziness episodes, possibly related to dehydration or allergies,  improved with increased hydration. Emphasis on maintaining hydration to prevent recurrence. - Ensure adequate hydration - Monitor for recurrence of dizziness- seek evaluation  Type 2 Diabetes Mellitus Type 2 Diabetes Mellitus is well-controlled with Mounjaro, with A1c at 5.7 down from 6.6.  Mounjaro improves insulin efficacy She is considering increasing the dose but is currently satisfied with the progress. Future consideration to reduce Mounjaro dose if weight goal is achieved by May.  Lab Results  Component Value Date   HGBA1C 5.7 (H) 03/27/2023   HGBA1C 6.5 (H) 10/26/2022   HGBA1C 6.0 (H) 11/18/2021   Lab Results  Component Value Date   LDLCALC 131 (H) 03/27/2023   CREATININE 0.83 04/24/2023    - Continue/refill Mounjaro 12.5 mg weekly - Monitor A1c levels- recheck in May - Consider reducing Mounjaro dose if weight goal is achieved by May She is working  on nutrition plan to decrease simple carbohydrates, increase lean proteins and exercise to promote weight loss and improve glycemic control .   Hyperlipidemia Cholesterol levels were elevated, possibly due to the holidays. High HDL of 65 is cardioprotective. Considering low-dose statin if cholesterol remains elevated. Postmenopausal status will be a consideration for long-term cardiovascular risk management. Last lipids Lab Results  Component Value Date   CHOL 207 (H) 03/27/2023   HDL 65 03/27/2023   LDLCALC 131 (H) 03/27/2023   TRIG 61 03/27/2023  Continue to work on nutrition plan -decreasing simple carbohydrates, increasing lean proteins, decreasing saturated fats and cholesterol , avoiding trans fats and exercise as able to promote weight loss, improve lipids and decrease cardiovascular risks. - Re-evaluate cholesterol levels in future labs - Consider low-dose statin if cholesterol remains elevated   Vitals Temp: 98.2 F (36.8 C) BP: 110/74 Pulse Rate: 74 SpO2: 98 %   Anthropometric Measurements Height: 5\' 7"   (1.702 m) Weight: 193 lb (87.5 kg) BMI (Calculated): 30.22 Weight at Last Visit: 195lb Weight Lost Since Last Visit: 2lb Weight Gained Since Last Visit: 0 Starting Weight: 213lb Total Weight Loss (lbs): 20 lb (9.072 kg) Peak Weight: 220lb   Body Composition  Body Fat %: 41.2 % Fat Mass (lbs): 79.6 lbs Muscle Mass (lbs): 107.8 lbs Total Body Water (lbs): 73 lbs Visceral Fat Rating : 10   Other Clinical Data Fasting: yes Labs: no Today's Visit #: 26 Starting Date: 05/12/20     ASSESSMENT AND PLAN:  Diet: Kimberly Boyd is currently in the action stage of change. As such, her goal is to continue with weight loss efforts and has agreed to the Category 1 Plan + 100 calories.   Exercise:  For substantial health benefits, adults should do at least 150 minutes (2 hours and 30 minutes) a week of moderate-intensity, or 75 minutes (1 hour and 15 minutes) a week of vigorous-intensity aerobic physical activity, or an equivalent combination of moderate- and vigorous-intensity aerobic activity. Aerobic activity should be performed in episodes of at least 10 minutes, and preferably, it should be spread throughout the week. and Adults should also include muscle-strengthening activities that involve all major muscle groups on 2 or more days a week.  Behavior Modification:  We discussed the following Behavioral Modification Strategies today: increasing lean protein  intake, decreasing simple carbohydrates, increasing vegetables, increase H2O intake, increase high fiber foods, no skipping meals, avoiding temptations, and planning for success. We discussed various medication options to help Kimberly Boyd with her weight loss efforts and we both agreed to continue Mounjaro 12.5 mg weekly.  Return in about 4 weeks (around 06/19/2023).Marland Kitchen She was informed of the importance of frequent follow up visits to maximize her success with intensive lifestyle modifications for her multiple health conditions.  Attestation  Statements:   Reviewed by clinician on day of visit: allergies, medications, problem list, medical history, surgical history, family history, social history, and previous encounter notes.   Time spent on visit including pre-visit chart review and post-visit care and charting was 43 minutes  Keishia Ground,PA-C

## 2023-05-27 ENCOUNTER — Other Ambulatory Visit (HOSPITAL_COMMUNITY): Payer: Self-pay

## 2023-06-13 ENCOUNTER — Other Ambulatory Visit (HOSPITAL_COMMUNITY): Payer: Self-pay

## 2023-06-19 ENCOUNTER — Encounter (INDEPENDENT_AMBULATORY_CARE_PROVIDER_SITE_OTHER): Payer: Self-pay | Admitting: Physician Assistant

## 2023-06-19 ENCOUNTER — Other Ambulatory Visit (HOSPITAL_COMMUNITY): Payer: Self-pay

## 2023-06-19 ENCOUNTER — Ambulatory Visit (INDEPENDENT_AMBULATORY_CARE_PROVIDER_SITE_OTHER): Payer: Commercial Managed Care - PPO | Admitting: Physician Assistant

## 2023-06-19 VITALS — BP 108/74 | HR 74 | Temp 98.0°F | Ht 67.0 in | Wt 192.0 lb

## 2023-06-19 DIAGNOSIS — E785 Hyperlipidemia, unspecified: Secondary | ICD-10-CM

## 2023-06-19 DIAGNOSIS — E1169 Type 2 diabetes mellitus with other specified complication: Secondary | ICD-10-CM | POA: Diagnosis not present

## 2023-06-19 DIAGNOSIS — Z7985 Long-term (current) use of injectable non-insulin antidiabetic drugs: Secondary | ICD-10-CM

## 2023-06-19 DIAGNOSIS — E1165 Type 2 diabetes mellitus with hyperglycemia: Secondary | ICD-10-CM

## 2023-06-19 DIAGNOSIS — Z6830 Body mass index (BMI) 30.0-30.9, adult: Secondary | ICD-10-CM | POA: Diagnosis not present

## 2023-06-19 DIAGNOSIS — E1159 Type 2 diabetes mellitus with other circulatory complications: Secondary | ICD-10-CM | POA: Diagnosis not present

## 2023-06-19 DIAGNOSIS — E559 Vitamin D deficiency, unspecified: Secondary | ICD-10-CM | POA: Diagnosis not present

## 2023-06-19 DIAGNOSIS — I152 Hypertension secondary to endocrine disorders: Secondary | ICD-10-CM | POA: Diagnosis not present

## 2023-06-19 DIAGNOSIS — E669 Obesity, unspecified: Secondary | ICD-10-CM

## 2023-06-19 MED ORDER — TIRZEPATIDE 12.5 MG/0.5ML ~~LOC~~ SOAJ
12.5000 mg | SUBCUTANEOUS | 1 refills | Status: DC
Start: 2023-06-19 — End: 2023-07-17
  Filled 2023-06-19: qty 2, 28d supply, fill #0

## 2023-06-19 NOTE — Progress Notes (Signed)
 SUBJECTIVE: Discussed the use of AI scribe software for clinical note transcription with the patient, who gave verbal consent to proceed.  Chief Complaint: Obesity  Interim History: She is down 1 lb since last visit.  Down 21 lbs overall TBW Loss of 9.6%   Kimberly Boyd is here to discuss her progress with her obesity treatment plan. She is on the practicing portion control and making smarter food choices, such as increasing vegetables and decreasing simple carbohydrates and states she is following her eating plan approximately 75 % of the time. She states she is exercising walking 30 minutes 3 times per week.  Kimberly Boyd "Kimberly Boyd" is a 58 year old female with obesity, type 2 diabetes, hypertension, and hyperlipidemia who presents for follow-up of her obesity treatment plan.  She has been on Mounjaro 12.5 mg weekly since August 2024 for type 2 diabetes and has experienced a total weight loss of 21 pounds, with a current weight of 192 pounds.  She is currently engaging in walking two to three times a week and is considering increasing her physical activity.  Her type 2 diabetes was diagnosed in August 2024, and she is currently on Mounjaro 12.5 mg weekly. She checks her blood sugars periodically, reporting fasting levels in the 80s to 90s and postprandial levels around 130 to 140 mg/dL. No symptoms prompt her to check her blood sugar levels.  Her past medical history includes hypertension, for which she takes a diuretic, and hyperlipidemia. She also has a history of vitamin D deficiency. She mentions that her blood pressure medication was not taken the previous day, which she believes affected her water retention.  She also mentions stress related to work and recent changes in her routine, which may be impacting her overall health and lifestyle choices but overall has very good adherence to her program.  FASTING LABS NEXT OV OBJECTIVE: Visit Diagnoses: Problem List Items Addressed This  Visit     Hypertension associated with diabetes (HCC)   Relevant Medications   tirzepatide (MOUNJARO) 12.5 MG/0.5ML Pen   Generalized obesity   Relevant Medications   tirzepatide (MOUNJARO) 12.5 MG/0.5ML Pen   Vitamin D insufficiency   Type 2 diabetes mellitus with hyperglycemia, without long-term current use of insulin (HCC) New diagnosis - Primary   Relevant Medications   tirzepatide (MOUNJARO) 12.5 MG/0.5ML Pen   Other Visit Diagnoses       Hyperlipidemia associated with type 2 diabetes mellitus (HCC)       Relevant Medications   tirzepatide (MOUNJARO) 12.5 MG/0.5ML Pen     BMI 30.0-30.9,adult Current BMI 30.1         Obesity She is on Mounjaro 12.5 mg weekly since August 2024 for Type 2 diabetes as well as obesity management, resulting in a 21-pound weight loss with a current weight of 192 pounds. There is a slight decrease in muscle mass and an increase in water weight.  Appetite suppression is adequate, and blood glucose levels are well-controlled.  The focus is on maintaining muscle mass and promoting healthy weight loss through exercise and portion control, with an emphasis on strength training to prevent further muscle mass loss and promote adipose tissue reduction. - Continue Mounjaro 12.5 mg weekly - Encourage regular exercise, including strength training and walking - Consider adding ankle weights, wrist weights, or a weighted vest during walks - Focus on portion control and smart dietary choices - Increase protein intake - Consider HIIT training for metabolic boost  Type 2 Diabetes Mellitus Type 2  diabetes is well-controlled with Mounjaro. Fasting blood glucose levels are in the 80s to 90s, and postprandial levels are around 130-140 mg/dL. No symptoms of hyperglycemia or hypoglycemia reported. Current Mounjaro dosage is effective, and no increase is necessary at this time. Lab Results  Component Value Date   HGBA1C 5.7 (H) 03/27/2023   HGBA1C 6.5 (H) 10/26/2022    HGBA1C 6.0 (H) 11/18/2021   Lab Results  Component Value Date   LDLCALC 131 (H) 03/27/2023   CREATININE 0.83 04/24/2023   INSULIN  Date Value Ref Range Status  03/27/2023 7.2 2.6 - 24.9 uIU/mL Final  ] - Maintain current Mounjaro 12.5 mg weekly dosage She is working  on nutrition plan to decrease simple carbohydrates, increase lean proteins and exercise to promote weight loss and improve glycemic control . Meds ordered this encounter  Medications   tirzepatide (MOUNJARO) 12.5 MG/0.5ML Pen    Sig: Inject 12.5 mg into the skin once a week.    Dispense:  2 mL    Refill:  1    Hypertension She is on a diuretic for hypertension management- Triamterene-hydrochlorothiazide 37.5-25 mg daily. She did not take the medication the previous day, resulting in decreased urine output and increased water retention. Emphasis on adherence to medication regimen to manage blood pressure and prevent fluid retention. BP Readings from Last 3 Encounters:  06/19/23 108/74  05/22/23 110/74  04/24/23 (!) 84/58   Lab Results  Component Value Date   NA 141 04/24/2023   CL 101 04/24/2023   K 3.7 04/24/2023   CO2 25 04/24/2023   BUN 19 04/24/2023   CREATININE 0.83 04/24/2023   EGFR 82 04/24/2023   CALCIUM 10.1 04/24/2023   ALBUMIN 4.5 03/27/2023   GLUCOSE 90 04/24/2023    - Ensure adherence to antihypertensive medication regimen Continue to work on nutrition plan to promote weight loss and improve BP control.    Hyperlipidemia Cholesterol levels were elevated after the holidays. Plan to re-evaluate lipid profile in the next visit after implementing lifestyle changes. Exercise and dietary modifications are expected to improve lipid levels. Not on statin therapy. On Mounjaro.  Lab Results  Component Value Date   CHOL 207 (H) 03/27/2023   CHOL 185 10/26/2022   CHOL 207 (H) 11/18/2021   Lab Results  Component Value Date   HDL 65 03/27/2023   HDL 68 10/26/2022   HDL 72 11/18/2021   Lab  Results  Component Value Date   LDLCALC 131 (H) 03/27/2023   LDLCALC 107 (H) 10/26/2022   LDLCALC 124 (H) 11/18/2021   Lab Results  Component Value Date   TRIG 61 03/27/2023   TRIG 52 10/26/2022   TRIG 62 11/18/2021   No results found for: "CHOLHDL" No results found for: "LDLDIRECT" The 10-year ASCVD risk score (Arnett DK, et al., 2019) is: 6.9%  May need to add statin therapy.  Continue to work on nutrition plan -decreasing simple carbohydrates, increasing lean proteins, decreasing saturated fats and cholesterol , avoiding trans fats and exercise as able to promote weight loss, improve lipids and decrease cardiovascular risks. - Plan fasting lipid panel at the next visit - Encourage lifestyle modifications, including exercise and dietary changes  Vitals Temp: 98 F (36.7 C) BP: 108/74 Pulse Rate: 74 SpO2: 99 %   Anthropometric Measurements Height: 5\' 7"  (1.702 m) Weight: 192 lb (87.1 kg) BMI (Calculated): 30.06 Weight at Last Visit: 193lb Weight Lost Since Last Visit: 1lb Weight Gained Since Last Visit: 0lb Starting Weight: 213lb   Body  Composition  Body Fat %: 41.4 % Fat Mass (lbs): 79.6 lbs Muscle Mass (lbs): 106.8 lbs Total Body Water (lbs): 75.6 lbs Visceral Fat Rating : 10   Other Clinical Data Fasting: Yes Labs: No Today's Visit #: 27 Starting Date: 05/12/20     ASSESSMENT AND PLAN:  Diet: Kimberly Boyd is currently in the action stage of change. As such, her goal is to continue with weight loss efforts. She has agreed to practicing portion control and making smarter food choices, such as increasing vegetables and decreasing simple carbohydrates.  Exercise: Kimberly Boyd has been instructed to work up to a goal of 150 minutes of combined cardio and strengthening exercise per week for weight loss and overall health benefits.   Behavior Modification:  We discussed the following Behavioral Modification Strategies today: increasing lean protein intake, decreasing  simple carbohydrates, increasing vegetables, increase H2O intake, increase high fiber foods, no skipping meals, avoiding temptations, and planning for success. We discussed various medication options to help Kimberly Boyd with her weight loss efforts and we both agreed to continue Mounjaro 15 mg weekly for type 2 diabetes and continue to work on nutritional and behavioral strategies to promote weight loss.  .  Return in about 4 weeks (around 07/17/2023).Marland Kitchen She was informed of the importance of frequent follow up visits to maximize her success with intensive lifestyle modifications for her multiple health conditions.  Attestation Statements:   Reviewed by clinician on day of visit: allergies, medications, problem list, medical history, surgical history, family history, social history, and previous encounter notes.   Time spent on visit including pre-visit chart review and post-visit care and charting was 32 minutes.    Jeriyah Granlund, PA-C

## 2023-06-26 ENCOUNTER — Other Ambulatory Visit: Payer: Self-pay | Admitting: Obstetrics and Gynecology

## 2023-06-26 DIAGNOSIS — Z1231 Encounter for screening mammogram for malignant neoplasm of breast: Secondary | ICD-10-CM

## 2023-07-12 ENCOUNTER — Ambulatory Visit

## 2023-07-16 NOTE — Progress Notes (Unsigned)
 SUBJECTIVE: Discussed the use of AI scribe software for clinical note transcription with the patient, who gave verbal consent to proceed.  Chief Complaint: Obesity  Interim History: She is down 5 lbs since last visit.  Down 26 lbs overall TBW loss of 12.21%  Kimberly Boyd is here to discuss her progress with her obesity treatment plan. She is on the practicing portion control and making smarter food choices, such as increasing vegetables and decreasing simple carbohydrates and states she is following her eating plan approximately 75-80 % of the time. She states she is exercising walking 30-45 minutes 3 times per week.  Kimberly Boyd "Kimberly Boyd" is a 58 year old female with obesity who presents for follow-up of her obesity treatment plan.  She has lost five pounds since her last visit, attributing her success to increased protein intake and more walking. Her initial weight loss goal was to reach 185 pounds, and she is currently at 187 pounds. Despite the weight loss, she does not feel an increase in energy, which she attributes to poor sleep and stress at work.  She experiences difficulty sleeping at night, feeling too hot and dealing with stress from work changes. She does not get much sunshine during the day due to work constraints. She takes magnesium  glycinate, 200 mg daily, and uses Miralax to manage constipation related to increased protein intake.  She is currently on Mounjaro  12.5 mg, which she feels is working well for her Type 2 diabetes and weight management. She has been monitoring her blood pressure. She takes her blood pressure medication in divided doses, one in the morning and one in the evening.  She is experiencing significant stress at work due to changes at work.   OBJECTIVE: Visit Diagnoses: Problem List Items Addressed This Visit     Stress   Hypertension associated with diabetes (HCC)   Relevant Medications   tirzepatide  (MOUNJARO ) 12.5 MG/0.5ML Pen   Generalized  obesity   Relevant Medications   tirzepatide  (MOUNJARO ) 12.5 MG/0.5ML Pen   Vitamin D  insufficiency   Relevant Orders   VITAMIN D  25 Hydroxy (Vit-D Deficiency, Fractures)   Type 2 diabetes mellitus with hyperglycemia, without long-term current use of insulin  (HCC) New diagnosis - Primary   Relevant Medications   tirzepatide  (MOUNJARO ) 12.5 MG/0.5ML Pen   Other Relevant Orders   CMP14+EGFR   Insulin , random   Lipid Panel With LDL/HDL Ratio   Other Visit Diagnoses       Hyperlipidemia associated with type 2 diabetes mellitus (HCC)       Relevant Medications   tirzepatide  (MOUNJARO ) 12.5 MG/0.5ML Pen     Sleep disorder, unspecified         Constipation, unspecified constipation type         Other fatigue       Relevant Orders   Vitamin B12   CBC with Differential/Platelet     BMI 29.0-29.9,adult Current BMI 29.4         Obesity Obesity management is ongoing with a current weight of 187 lbs, nearly at her initial goal of 185 lbs.  Increased protein intake and physical activity have contributed to weight loss. The current dose of Mounjaro  (12.5 mg) is effective.Denies mass in neck, dysphagia, dyspepsia, persistent hoarseness, abdominal pain, or N/V/Constipation or diarrhea. Has annual eye exam. Mood is stable.  Down 26 lbs TBW loss of 12.21% - Continue Mounjaro  (12.5 mg) weekly - Encourage continued protein intake and physical activity   Type 2 Diabetes Mellitus with hyperglycemia,  without long-term current use of insulin  HgbA1c is at goal. Last A1c was 5.7 Not on statin Not on ARB or ACE Medication(s): Mounjaro  12.5 mg SQ weekly Denies mass in neck, dysphagia, dyspepsia, persistent hoarseness, abdominal pain, or N/Vor diarrhea. Has annual eye exam. Mood is stable.  Constipation improved with increase in water and fiber. Using Miralax at times.  Lab Results  Component Value Date   HGBA1C 5.7 (H) 03/27/2023   HGBA1C 6.5 (H) 10/26/2022   HGBA1C 6.0 (H) 11/18/2021   Lab  Results  Component Value Date   LDLCALC 131 (H) 03/27/2023   CREATININE 0.83 04/24/2023   No results found for: "GFR"  Plan: Continue and refill Mounjaro  12.5 mg SQ weekly She is working  on nutrition plan to decrease simple carbohydrates, increase lean proteins and exercise to promote weight loss and improve glycemic control . Meds ordered this encounter  Medications   DISCONTD: tirzepatide  (MOUNJARO ) 12.5 MG/0.5ML Pen    Sig: Inject 12.5 mg into the skin once a week.    Dispense:  2 mL    Refill:  1   tirzepatide  (MOUNJARO ) 12.5 MG/0.5ML Pen    Sig: Inject 12.5 mg into the skin once a week.    Dispense:  2 mL    Refill:  1     Hyperlipidemia LDL is not at goal. Medication(s): Mounjaro .  Not currently on statin therapy.  Cardiovascular risk factors: diabetes mellitus, dyslipidemia, and hypertension  Lab Results  Component Value Date   CHOL 207 (H) 03/27/2023   HDL 65 03/27/2023   LDLCALC 131 (H) 03/27/2023   TRIG 61 03/27/2023   Lab Results  Component Value Date   ALT 25 03/27/2023   AST 21 03/27/2023   ALKPHOS 83 03/27/2023   BILITOT 0.4 03/27/2023   The 10-year ASCVD risk score (Arnett DK, et al., 2019) is: 4.7%   Values used to calculate the score:     Age: 12 years     Sex: Female     Is Non-Hispanic African American: Yes     Diabetic: Yes     Tobacco smoker: No     Systolic Blood Pressure: 96 mmHg     Is BP treated: Yes     HDL Cholesterol: 65 mg/dL     Total Cholesterol: 207 mg/dL  Plan: Continue to work on nutrition plan -decreasing simple carbohydrates, increasing lean proteins, decreasing saturated fats and cholesterol , avoiding trans fats and exercise as able to promote weight loss, improve lipids and decrease cardiovascular risks. She is on Mounjaro .  Recheck fasting lipids today and may need to consider statin therapy if LDL not improved.      Hypertension Blood pressure management is ongoing. She reports taking medication-  triamterene -hydrochlorothiazide  37.5-25 mg 1/2 tab daily and takes propranolol  120 mg daily for palpitations . Recent blood pressure readings have been higher, possibly due to dehydration from fasting before the appointment. No side effects. BP lower than usual, but is fasting for labs today.  Renal function in normal range.  BP Readings from Last 3 Encounters:  07/17/23 96/66  06/19/23 108/74  05/22/23 110/74   Lab Results  Component Value Date   NA 141 04/24/2023   CL 101 04/24/2023   K 3.7 04/24/2023   CO2 25 04/24/2023   BUN 19 04/24/2023   CREATININE 0.83 04/24/2023   EGFR 82 04/24/2023   CALCIUM 10.1 04/24/2023   ALBUMIN 4.5 03/27/2023   GLUCOSE 90 04/24/2023   Continue medications- triamterene - hydrochlorothiazide  and propranolol   for palpitations.  Recheck labs today.  - Monitor blood pressure regularly -Continue to work on nutrition plan to promote weight loss and improve BP control.    Sleep disturbance Sleep disturbance is likely exacerbated by stress and inadequate sleep hygiene. She reports feeling too hot at night and experiencing work-related stress. Magnesium  glycinate is being used to aid sleep, with a potential increase in dosage discussed. Sunlight exposure is recommended to improve circadian rhythm and melatonin production. - Encourage sunlight exposure between 10 AM and 12 PM to improve circadian rhythm - Consider increasing magnesium  glycinate to 400 mg daily, monitoring for gastrointestinal side effects  Stress Significant stress related to work changes and awaiting job interview results is contributing to sleep disturbances. - Encourage stress management techniques, including outdoor activities and fresh air exposure  Constipation Constipation is likely related to increased protein intake. She is managing with increased water intake and occasional use of Miralax. - Increase dietary fiber intake - Continue hydration and use of Miralax as needed  Vitamin D   Deficiency Vitamin D  is not at goal of 50.  Most recent vitamin D  level was 46.5. She is on OTC vitamin D  4000 units daily. Lab Results  Component Value Date   VD25OH 46.5 03/27/2023   VD25OH 37.9 10/26/2022   VD25OH 36.3 11/24/2020    Plan: Continue OTC vitamin D  4000 units daily.  Low vitamin D  levels can be associated with adiposity and may result in leptin resistance and weight gain. Also associated with fatigue.  Currently on vitamin D  supplementation without any adverse effects such as nausea, vomiting or muscle weakness.  Recheck vitamin D  level today.   Fatigue Recheck CBC, vitamin B 12 and D  Vitals Temp: 98.2 F (36.8 C) BP: 96/66 Pulse Rate: 76 SpO2: 99 %   Anthropometric Measurements Height: 5\' 7"  (1.702 m) Weight: 187 lb (84.8 kg) BMI (Calculated): 29.28 Weight at Last Visit: 187 lb Weight Lost Since Last Visit: 5 lb Weight Gained Since Last Visit: 0 Starting Weight: 213 lb Total Weight Loss (lbs): 26 lb (11.8 kg) Peak Weight: 220 lb   Body Composition  Body Fat %: 40.3 % Fat Mass (lbs): 75.6 lbs Muscle Mass (lbs): 106.2 lbs Total Body Water (lbs): 71.8 lbs Visceral Fat Rating : 10   Other Clinical Data Fasting: yes Labs: yes Today's Visit #: 28 Starting Date: 05/12/20     ASSESSMENT AND PLAN:  Diet: Fidela is currently in the action stage of change. As such, her goal is to continue with weight loss efforts. She has agreed to Category 2 Plan.  Exercise: Jazzmyne has been instructed to work up to a goal of 150 minutes of combined cardio and strengthening exercise per week for weight loss and overall health benefits.   Behavior Modification:  We discussed the following Behavioral Modification Strategies today: increasing lean protein intake, decreasing simple carbohydrates, increasing vegetables, increase H2O intake, increase high fiber foods, no skipping meals, avoiding temptations, and planning for success. We discussed various medication  options to help Chandni with her weight loss efforts and we both agreed to continue current treatment plan, continue to work on nutritional and behavioral strategies to promote weight loss.  .  Return in about 4 weeks (around 08/14/2023).Aaron Aas She was informed of the importance of frequent follow up visits to maximize her success with intensive lifestyle modifications for her multiple health conditions.  Attestation Statements:   Reviewed by clinician on day of visit: allergies, medications, problem list, medical history, surgical history, family history,  social history, and previous encounter notes.   Time spent on visit including pre-visit chart review and post-visit care and charting was 36 minutes.    Millard Bautch, PA-C

## 2023-07-17 ENCOUNTER — Encounter (INDEPENDENT_AMBULATORY_CARE_PROVIDER_SITE_OTHER): Payer: Self-pay | Admitting: Physician Assistant

## 2023-07-17 ENCOUNTER — Other Ambulatory Visit (HOSPITAL_COMMUNITY): Payer: Self-pay

## 2023-07-17 ENCOUNTER — Ambulatory Visit (INDEPENDENT_AMBULATORY_CARE_PROVIDER_SITE_OTHER): Admitting: Physician Assistant

## 2023-07-17 VITALS — BP 96/66 | HR 76 | Temp 98.2°F | Ht 67.0 in | Wt 187.0 lb

## 2023-07-17 DIAGNOSIS — E1159 Type 2 diabetes mellitus with other circulatory complications: Secondary | ICD-10-CM

## 2023-07-17 DIAGNOSIS — G479 Sleep disorder, unspecified: Secondary | ICD-10-CM | POA: Diagnosis not present

## 2023-07-17 DIAGNOSIS — E559 Vitamin D deficiency, unspecified: Secondary | ICD-10-CM | POA: Diagnosis not present

## 2023-07-17 DIAGNOSIS — Z7985 Long-term (current) use of injectable non-insulin antidiabetic drugs: Secondary | ICD-10-CM

## 2023-07-17 DIAGNOSIS — E1169 Type 2 diabetes mellitus with other specified complication: Secondary | ICD-10-CM | POA: Diagnosis not present

## 2023-07-17 DIAGNOSIS — F439 Reaction to severe stress, unspecified: Secondary | ICD-10-CM

## 2023-07-17 DIAGNOSIS — Z6829 Body mass index (BMI) 29.0-29.9, adult: Secondary | ICD-10-CM

## 2023-07-17 DIAGNOSIS — I152 Hypertension secondary to endocrine disorders: Secondary | ICD-10-CM | POA: Diagnosis not present

## 2023-07-17 DIAGNOSIS — E785 Hyperlipidemia, unspecified: Secondary | ICD-10-CM

## 2023-07-17 DIAGNOSIS — K5900 Constipation, unspecified: Secondary | ICD-10-CM | POA: Diagnosis not present

## 2023-07-17 DIAGNOSIS — E1165 Type 2 diabetes mellitus with hyperglycemia: Secondary | ICD-10-CM | POA: Diagnosis not present

## 2023-07-17 DIAGNOSIS — E669 Obesity, unspecified: Secondary | ICD-10-CM

## 2023-07-17 DIAGNOSIS — R5383 Other fatigue: Secondary | ICD-10-CM | POA: Diagnosis not present

## 2023-07-17 MED ORDER — TIRZEPATIDE 12.5 MG/0.5ML ~~LOC~~ SOAJ
12.5000 mg | SUBCUTANEOUS | 1 refills | Status: DC
  Filled 2023-07-17: qty 2, 28d supply, fill #0

## 2023-07-17 MED ORDER — TIRZEPATIDE 12.5 MG/0.5ML ~~LOC~~ SOAJ
12.5000 mg | SUBCUTANEOUS | 1 refills | Status: DC
  Filled 2023-07-17: qty 2, 28d supply, fill #0
  Filled 2023-08-14: qty 2, 28d supply, fill #1

## 2023-07-18 LAB — CBC WITH DIFFERENTIAL/PLATELET
Basophils Absolute: 0.1 10*3/uL (ref 0.0–0.2)
Basos: 1 %
EOS (ABSOLUTE): 0.1 10*3/uL (ref 0.0–0.4)
Eos: 1 %
Hematocrit: 40.9 % (ref 34.0–46.6)
Hemoglobin: 13.7 g/dL (ref 11.1–15.9)
Immature Grans (Abs): 0 10*3/uL (ref 0.0–0.1)
Immature Granulocytes: 0 %
Lymphocytes Absolute: 1.8 10*3/uL (ref 0.7–3.1)
Lymphs: 41 %
MCH: 31.7 pg (ref 26.6–33.0)
MCHC: 33.5 g/dL (ref 31.5–35.7)
MCV: 95 fL (ref 79–97)
Monocytes Absolute: 0.4 10*3/uL (ref 0.1–0.9)
Monocytes: 8 %
Neutrophils Absolute: 2.2 10*3/uL (ref 1.4–7.0)
Neutrophils: 49 %
Platelets: 264 10*3/uL (ref 150–450)
RBC: 4.32 x10E6/uL (ref 3.77–5.28)
RDW: 13.1 % (ref 11.7–15.4)
WBC: 4.5 10*3/uL (ref 3.4–10.8)

## 2023-07-18 LAB — CMP14+EGFR
ALT: 19 IU/L (ref 0–32)
AST: 19 IU/L (ref 0–40)
Albumin: 4.4 g/dL (ref 3.8–4.9)
Alkaline Phosphatase: 79 IU/L (ref 44–121)
BUN/Creatinine Ratio: 17 (ref 9–23)
BUN: 15 mg/dL (ref 6–24)
Bilirubin Total: 0.4 mg/dL (ref 0.0–1.2)
CO2: 21 mmol/L (ref 20–29)
Calcium: 10.1 mg/dL (ref 8.7–10.2)
Chloride: 102 mmol/L (ref 96–106)
Creatinine, Ser: 0.87 mg/dL (ref 0.57–1.00)
Globulin, Total: 3.6 g/dL (ref 1.5–4.5)
Glucose: 85 mg/dL (ref 70–99)
Potassium: 3.8 mmol/L (ref 3.5–5.2)
Sodium: 140 mmol/L (ref 134–144)
Total Protein: 8 g/dL (ref 6.0–8.5)
eGFR: 78 mL/min/{1.73_m2} (ref >59)

## 2023-07-18 LAB — VITAMIN D 25 HYDROXY (VIT D DEFICIENCY, FRACTURES): Vit D, 25-Hydroxy: 40.1 ng/mL (ref 30.0–100.0)

## 2023-07-18 LAB — LIPID PANEL WITH LDL/HDL RATIO
Cholesterol, Total: 207 mg/dL — ABNORMAL HIGH (ref 100–199)
HDL: 64 mg/dL (ref >39)
LDL Chol Calc (NIH): 132 mg/dL — ABNORMAL HIGH (ref 0–99)
LDL/HDL Ratio: 2.1 ratio (ref 0.0–3.2)
Triglycerides: 62 mg/dL (ref 0–149)
VLDL Cholesterol Cal: 11 mg/dL (ref 5–40)

## 2023-07-18 LAB — VITAMIN B12: Vitamin B-12: 541 pg/mL (ref 232–1245)

## 2023-07-18 LAB — INSULIN, RANDOM: INSULIN: 9.8 u[IU]/mL (ref 2.6–24.9)

## 2023-08-16 ENCOUNTER — Ambulatory Visit

## 2023-09-04 ENCOUNTER — Other Ambulatory Visit (HOSPITAL_COMMUNITY): Payer: Self-pay

## 2023-09-04 ENCOUNTER — Encounter (INDEPENDENT_AMBULATORY_CARE_PROVIDER_SITE_OTHER): Payer: Self-pay | Admitting: Physician Assistant

## 2023-09-04 ENCOUNTER — Ambulatory Visit (INDEPENDENT_AMBULATORY_CARE_PROVIDER_SITE_OTHER): Admitting: Physician Assistant

## 2023-09-04 VITALS — BP 131/83 | HR 64 | Temp 98.6°F | Ht 67.0 in | Wt 185.0 lb

## 2023-09-04 DIAGNOSIS — I152 Hypertension secondary to endocrine disorders: Secondary | ICD-10-CM

## 2023-09-04 DIAGNOSIS — E1159 Type 2 diabetes mellitus with other circulatory complications: Secondary | ICD-10-CM

## 2023-09-04 DIAGNOSIS — E669 Obesity, unspecified: Secondary | ICD-10-CM

## 2023-09-04 DIAGNOSIS — E785 Hyperlipidemia, unspecified: Secondary | ICD-10-CM

## 2023-09-04 DIAGNOSIS — E1169 Type 2 diabetes mellitus with other specified complication: Secondary | ICD-10-CM | POA: Diagnosis not present

## 2023-09-04 DIAGNOSIS — E1165 Type 2 diabetes mellitus with hyperglycemia: Secondary | ICD-10-CM

## 2023-09-04 DIAGNOSIS — Z6829 Body mass index (BMI) 29.0-29.9, adult: Secondary | ICD-10-CM | POA: Diagnosis not present

## 2023-09-04 DIAGNOSIS — E559 Vitamin D deficiency, unspecified: Secondary | ICD-10-CM | POA: Diagnosis not present

## 2023-09-04 DIAGNOSIS — Z7985 Long-term (current) use of injectable non-insulin antidiabetic drugs: Secondary | ICD-10-CM | POA: Diagnosis not present

## 2023-09-04 MED ORDER — TIRZEPATIDE 12.5 MG/0.5ML ~~LOC~~ SOAJ
12.5000 mg | SUBCUTANEOUS | 1 refills | Status: DC
  Filled 2023-09-04 – 2023-09-13 (×2): qty 2, 28d supply, fill #0

## 2023-09-04 NOTE — Progress Notes (Signed)
 SUBJECTIVE: Discussed the use of AI scribe software for clinical note transcription with the patient, who gave verbal consent to proceed.  Chief Complaint: Obesity  Interim History: She is down 2 lbs since last visit.  Down 28 lbs overall TBW Loss of 13.2%  Kelsy is here to discuss her progress with her obesity treatment plan. She is on the practicing portion control and making smarter food choices, such as increasing vegetables and decreasing simple carbohydrates and states she is following her eating plan approximately 85 % of the time. She states she is walking for exercise 30 minutes 3 times per week. Sharlotte E Philyaw Arnetta Gaughran is a 58 year old female who presents for follow-up of her obesity treatment plan.  She has successfully lost 28 pounds, achieving over a 13+% reduction in body weight. Her initial goal was to reach 185 pounds, and she is now aiming for 180 pounds. The weight loss has been gradual, which she finds beneficial for long-term maintenance.  Her type 2 diabetes is managed with Mounjaro  12.5 mg weekly. She experiences constipation, which she manages with increased fluid intake, Linzess  72 mcg as needed, and Dulcolax chews occasionally. Her last A1c was 5.7, and her glucose level was 85, the lowest recently recorded. Insulin  level was 9.8, slightly higher than previous but within an acceptable range.  Hypertension is managed with triamterene  hydrochlorothiazide  37.5-25 mg, half a tablet every morning, and propranolol  120 mg daily. A recent blood pressure reading was 130/80, slightly higher than usual, possibly due to stress related to upcoming travel plans.  She has hyperlipidemia and is not currently on a statin. Her LDL levels have increased from 107 to 132, which she attributes to increased red meat consumption as part of her protein intake.  She has a history of vitamin D  deficiency and is currently taking 2000 units of vitamin D  daily. Her recent vitamin D  level  was 40.1.  Her social history includes a recent job change, which she feels has reduced her stress levels. She is preparing for a trip to [place] and is mindful of maintaining her health regimen during travel.  OBJECTIVE: Visit Diagnoses: Problem List Items Addressed This Visit     Hypertension associated with diabetes (HCC)   Relevant Medications   tirzepatide  (MOUNJARO ) 12.5 MG/0.5ML Pen   Generalized obesity   Relevant Medications   tirzepatide  (MOUNJARO ) 12.5 MG/0.5ML Pen   Vitamin D  insufficiency   Type 2 diabetes mellitus with hyperglycemia, without long-term current use of insulin  (HCC) New diagnosis - Primary   Relevant Medications   tirzepatide  (MOUNJARO ) 12.5 MG/0.5ML Pen   Other Visit Diagnoses       Hyperlipidemia associated with type 2 diabetes mellitus (HCC)       Relevant Medications   tirzepatide  (MOUNJARO ) 12.5 MG/0.5ML Pen     BMI 29.0-29.9,adult Current BMI 29.1          Obesity The next goal is to reach 180 pounds. Mounjaro  12.5 mg weekly is well-tolerated, with constipation managed by increased fluid intake, Linzess , and Dulcolax chews. Encouraged to continue weight loss efforts, especially with increased physical activity during the upcoming trip to [place]. - Continue Mounjaro  12.5 mg weekly - Encourage weight loss with a goal of 180 pounds - Manage constipation with increased fluid intake, Linzess , and Dulcolax chews as needed - Encourage physical activity, especially walking during the trip to [place]   Type 2 Diabetes Mellitus with hyperglycemia, without long-term current use of insulin  HgbA1c is at goal. Last A1c was  5.7- labs not completed last lab draw  Diabetes is well-controlled with Mounjaro , as indicated by an A1c of 5.7 and a glucose level of 85. Insulin  level is 9.8, slightly elevated from previous levels. No Statin No ARB or ACE Medication(s): Mounjaro  12.5 mg SQ weekly Denies mass in neck, dysphagia, dyspepsia, persistent hoarseness, abdominal  pain, or N/V/Constipation or diarrhea. Has annual eye exam. Mood is stable.  Chronic constipation addressed with Linzess  but not requiring daily.  Lab Results  Component Value Date   HGBA1C 5.7 (H) 03/27/2023   HGBA1C 6.5 (H) 10/26/2022   HGBA1C 6.0 (H) 11/18/2021   Lab Results  Component Value Date   LDLCALC 132 (H) 07/17/2023   CREATININE 0.87 07/17/2023   No results found for: GFR  Plan: - Continue Mounjaro  for diabetes management - Re-evaluate A1c and insulin  levels after returning from the trip Continue and refill Mounjaro  12.5 mg SQ weekly She is working  on nutrition plan to decrease simple carbohydrates, increase lean proteins and exercise to promote weight loss and improve glycemic control . Meds ordered this encounter  Medications   tirzepatide  (MOUNJARO ) 12.5 MG/0.5ML Pen    Sig: Inject 12.5 mg into the skin once a week.    Dispense:  2 mL    Refill:  1    Hypertension Blood pressure is managed with triamterene  hydrochlorothiazide  and propranolol . Recent reading of 130/80 is slightly elevated, likely due to travel-related stress. - Continue triamterene  hydrochlorothiazide  37.5-25 mg, half a tablet every morning - Continue propranolol  120 mg daily -Continue to work on nutrition plan to promote weight loss and improve BP control.   Hyperlipidemia LDL is slightly elevated at 132, while HDL is satisfactory. Not on a statin currently. Consideration of statin therapy and coronary calcium score assessment after the trip. Advised to reduce red meat intake and increase exercise, particularly weight training, to lower LDL levels. - Consider starting a statin after returning from the trip - Discuss obtaining a coronary calcium score after returning - Encourage reduction of red meat intake/decreased saturated fats - Encourage exercise, particularly weight training - Recheck lipids in 2-3 months  Vitamin D  Deficiency Vitamin D  level is 40.1, below the desired range of  50-70. Currently taking vitamin D  OTC 2000 units daily. - Increase vitamin D  intake to 4000 units daily Low vitamin D  levels can be associated with adiposity and may result in leptin resistance and weight gain. Also associated with fatigue.  Currently on vitamin D  supplementation without any adverse effects such as nausea, vomiting or muscle weakness.  Recheck level in 3-4 months.   General Health Maintenance Advised to maintain a balanced diet with lean meats and plant-based proteins. Encouraged meal prep to avoid fast food. Magnesium  levels to be monitored in future lab work. - Encourage a balanced diet with lean meats and plant-based proteins - Explore meal prep options to avoid fast food - Monitor magnesium  levels in future lab work  Follow-up Follow-up appointment on August 4th at 7:30 AM. Consider fasting labs at the September appointment. - Follow-up appointment on August 4th at 7:30 AM - Consider fasting labs at the September appointment  Vitals Temp: 98.6 F (37 C) BP: 131/83 Pulse Rate: 64 SpO2: 98 %   Anthropometric Measurements Height: 5' 7 (1.702 m) Weight: 185 lb (83.9 kg) BMI (Calculated): 28.97 Weight at Last Visit: 187 lb Weight Lost Since Last Visit: 2 lb Weight Gained Since Last Visit: 0 Starting Weight: 213 lb Total Weight Loss (lbs): 28 lb (12.7 kg) Peak  Weight: 220 lb   Body Composition  Body Fat %: 40.7 % Fat Mass (lbs): 75.6 lbs Muscle Mass (lbs): 104.6 lbs Total Body Water (lbs): 75.8 lbs Visceral Fat Rating : 10   Other Clinical Data Fasting: No Labs: No Today's Visit #: 29 Starting Date: 05/12/20     ASSESSMENT AND PLAN:  Diet: Claudette is currently in the action stage of change. As such, her goal is to continue with weight loss efforts. She has agreed to practicing portion control and making smarter food choices, such as increasing vegetables and decreasing simple carbohydrates.  Exercise: Hadlie has been instructed to work up to  a goal of 150 minutes of combined cardio and strengthening exercise per week and THink about adding some weight training for weight loss and overall health benefits.   Behavior Modification:  We discussed the following Behavioral Modification Strategies today: increasing lean protein intake, decreasing simple carbohydrates, increasing vegetables, increase H2O intake, increase high fiber foods, no skipping meals, meal planning and cooking strategies, travel eating strategies, avoiding temptations, and planning for success. We discussed various medication options to help Ronisha with her weight loss efforts and we both agreed to continue current treatment plan, continue to work on nutritional and behavioral strategies to promote weight loss.   .  Return in about 4 weeks (around 10/02/2023).SABRA She was informed of the importance of frequent follow up visits to maximize her success with intensive lifestyle modifications for her multiple health conditions.  Attestation Statements:   Reviewed by clinician on day of visit: allergies, medications, problem list, medical history, surgical history, family history, social history, and previous encounter notes.   Time spent on visit including pre-visit chart review and post-visit care and charting was 33 minutes.    Tessi Eustache, PA-C

## 2023-09-13 ENCOUNTER — Other Ambulatory Visit (HOSPITAL_COMMUNITY): Payer: Self-pay

## 2023-09-22 ENCOUNTER — Ambulatory Visit

## 2023-10-05 ENCOUNTER — Ambulatory Visit
Admission: RE | Admit: 2023-10-05 | Discharge: 2023-10-05 | Disposition: A | Discharge status: Home / Self Care | Source: Ambulatory Visit | Attending: Obstetrics and Gynecology | Admitting: Obstetrics and Gynecology

## 2023-10-05 DIAGNOSIS — Z1231 Encounter for screening mammogram for malignant neoplasm of breast: Secondary | ICD-10-CM

## 2023-10-15 NOTE — Progress Notes (Unsigned)
 SUBJECTIVE: Discussed the use of AI scribe software for clinical note transcription with the patient, who gave verbal consent to proceed.  Chief Complaint: Obesity  Interim History: She is down 2 lbs since her last visit Down 30 lbs overall TBW Loss of 14.1 %   Muscle mass + 4.8 lbs Adipose mass - 7 lbs !!!  Kimberly Boyd is here to discuss her progress with her obesity treatment plan. She is on the practicing portion control and making smarter food choices, such as increasing vegetables and decreasing simple carbohydrates and states she is following her eating plan approximately 75 % of the time. She states she is exercising walking 10K steps daily/ stair climbing 30 minutes 2-3 times per week.  Kimberly Boyd is a 58 year old female with obesity and type 2 diabetes who presents for follow-up on her obesity treatment plan.  She has achieved a 30-pound weight loss and is currently following a portion control /Smart Choices nutrition plan. She is taking Mounjaro  12.5 mg weekly for management of Type 2 diabetes.  She experiences increased hunger lately, particularly in the evenings, which she describes as 'just hungry.' She manages these cravings with protein-rich foods like rotisserie chicken.  She has a history of type 2 diabetes and previously tried metformin  but discontinued it due to gastrointestinal side effects, including diarrhea and stomach sensitivity.  We discussed restarting a very low dose of metformin  to try to address some of her increased hunger/cravings and will monitor for recurrent GI symptoms. She does currently have some constipation and metformin  may help with this as well.   She has a history of constipation and is currently taking Linzess . Additionally, she has a history of vitamin D  deficiency and is taking 4000 units of OTC vitamin D  daily.  In terms of physical activity, she has been actively helping her daughter move, which involves lifting boxes and  climbing stairs. She also runs up and down stairs at home to build muscle, particularly in her thighs, to aid glucose metabolism. She reports increased physical activity and efforts to build muscle, particularly in her thighs, to aid glucose metabolism. OBJECTIVE: Visit Diagnoses: Problem List Items Addressed This Visit     Hypertension associated with diabetes (HCC)   Relevant Medications   metFORMIN  (GLUCOPHAGE ) 500 MG tablet   tirzepatide  (MOUNJARO ) 12.5 MG/0.5ML Pen   Generalized obesity   Relevant Medications   metFORMIN  (GLUCOPHAGE ) 500 MG tablet   tirzepatide  (MOUNJARO ) 12.5 MG/0.5ML Pen   Vitamin D  insufficiency   Type 2 diabetes mellitus with hyperglycemia, without long-term current use of insulin  (HCC) New diagnosis - Primary   Relevant Medications   metFORMIN  (GLUCOPHAGE ) 500 MG tablet   tirzepatide  (MOUNJARO ) 12.5 MG/0.5ML Pen   Other Visit Diagnoses       Hyperlipidemia associated with type 2 diabetes mellitus (HCC)       Relevant Medications   metFORMIN  (GLUCOPHAGE ) 500 MG tablet   tirzepatide  (MOUNJARO ) 12.5 MG/0.5ML Pen     BMI 28.0-28.9,adult Current BMI 28.7         Type 2 diabetes mellitus Current management includes Mounjaro  and lifestyle modifications. Insulin  level is 9.8, with a goal of 5. Metformin  is considered to help with insulin  resistance and reduce cravings. She expressed concern about gastrointestinal side effects from metformin , but willing to try again as having some issues with constipation on Mounjaro .  Denies mass in neck, dysphagia, dyspepsia, persistent hoarseness, abdominal pain, or N/V/Constipation or diarrhea. Has annual eye exam. Mood is stable.  Lab Results  Component Value Date   HGBA1C 5.7 (H) 03/27/2023   HGBA1C 6.5 (H) 10/26/2022   HGBA1C 6.0 (H) 11/18/2021   Lab Results  Component Value Date   LDLCALC 132 (H) 07/17/2023   CREATININE 0.87 07/17/2023   A1c has continued to improve.  Slight insulin  resistance.  Refill and  continue Mounjaro  12.5 mg weekly.  - Start metformin  500 mg, take half a tablet daily with food - Continue lifestyle modifications including portion control and exercise  Obesity Ongoing management with significant progress; 30-pound weight loss. Currently on Smart Choices portion control plan and Mounjaro  12.5 mg weekly. Increased hunger, particularly in the evenings, may be related to insulin  resistance. Metformin  is considered to help with insulin  resistance and cravings. Prefers not to add topiramate. Weightlifting and muscle building have reduced adipose tissue and increased muscle mass. - Continue Mounjaro  12.5 mg weekly - Start metformin  500 mg, take half a tablet daily with food - Encourage weightlifting and muscle building exercises Meds ordered this encounter  Medications   metFORMIN  (GLUCOPHAGE ) 500 MG tablet    Sig: Take 1 tablet (500 mg total) by mouth daily.    Dispense:  30 tablet    Refill:  0   tirzepatide  (MOUNJARO ) 12.5 MG/0.5ML Pen    Sig: Inject 12.5 mg into the skin once a week.    Dispense:  2 mL    Refill:  1    Constipation Managed with Linzess . Mounjaro  can contribute to constipation, and metformin  may alleviate this side effect due to its gastrointestinal effects. -Increase water and fiber - Continue Linzess  - Start metformin  500 mg, take half a tablet daily with food and increase to whole tablet if tolerating well.   Vitamin D  deficiency Managed with 4000 units of vitamin D  daily. - Continue vitamin D  4000 units daily  Vitals Temp: 98.2 F (36.8 C) BP: 110/73 Pulse Rate: 67 SpO2: 99 %   Anthropometric Measurements Height: 5' 7 (1.702 m) Weight: 183 lb (83 kg) BMI (Calculated): 28.66 Weight at Last Visit: 185 lb Weight Lost Since Last Visit: 2 lb Weight Gained Since Last Visit: 0 Starting Weight: 213 lb Total Weight Loss (lbs): 30 lb (13.6 kg) Peak Weight: 220 lb   Body Composition  Body Fat %: 37.3 % Fat Mass (lbs): 68.6 lbs Muscle  Mass (lbs): 109.4 lbs Total Body Water (lbs): 75.5 lbs Visceral Fat Rating : 9   Other Clinical Data Fasting: no Labs: no Today's Visit #: 30 Starting Date: 05/12/20     ASSESSMENT AND PLAN:  Diet: Sadiya is currently in the action stage of change. As such, her goal is to continue with weight loss efforts. She has agreed to practicing portion control and making smarter food choices, such as increasing vegetables and decreasing simple carbohydrates.  Exercise: Reniya has been instructed to work up to a goal of 150 minutes of combined cardio and strengthening exercise per week for weight loss and overall health benefits.   Behavior Modification:  We discussed the following Behavioral Modification Strategies today: increasing lean protein intake, decreasing simple carbohydrates, increasing vegetables, increase H2O intake, increase high fiber foods, no skipping meals, meal planning and cooking strategies, ways to avoid nighttime snacking, avoiding temptations, and planning for success. We discussed various medication options to help Mahasin with her weight loss efforts and we both agreed to continue current treatment plan and add low dose metformin  for primary indication of diabetes with mild insulin  resistance.  Return in about 4 weeks (around 11/13/2023)  for ? Check CEA .SABRA She was informed of the importance of frequent follow up visits to maximize her success with intensive lifestyle modifications for her multiple health conditions.  Attestation Statements:   Reviewed by clinician on day of visit: allergies, medications, problem list, medical history, surgical history, family history, social history, and previous encounter notes.   Time spent on visit including pre-visit chart review and post-visit care and charting was 32 minutes.    Dacota Devall, PA-C

## 2023-10-16 ENCOUNTER — Other Ambulatory Visit (HOSPITAL_COMMUNITY): Payer: Self-pay

## 2023-10-16 ENCOUNTER — Ambulatory Visit (INDEPENDENT_AMBULATORY_CARE_PROVIDER_SITE_OTHER): Admitting: Physician Assistant

## 2023-10-16 ENCOUNTER — Encounter (INDEPENDENT_AMBULATORY_CARE_PROVIDER_SITE_OTHER): Payer: Self-pay | Admitting: Physician Assistant

## 2023-10-16 ENCOUNTER — Other Ambulatory Visit: Payer: Self-pay

## 2023-10-16 VITALS — BP 110/73 | HR 67 | Temp 98.2°F | Ht 67.0 in | Wt 183.0 lb

## 2023-10-16 DIAGNOSIS — E559 Vitamin D deficiency, unspecified: Secondary | ICD-10-CM | POA: Diagnosis not present

## 2023-10-16 DIAGNOSIS — E669 Obesity, unspecified: Secondary | ICD-10-CM

## 2023-10-16 DIAGNOSIS — Z7984 Long term (current) use of oral hypoglycemic drugs: Secondary | ICD-10-CM | POA: Diagnosis not present

## 2023-10-16 DIAGNOSIS — E785 Hyperlipidemia, unspecified: Secondary | ICD-10-CM

## 2023-10-16 DIAGNOSIS — Z7985 Long-term (current) use of injectable non-insulin antidiabetic drugs: Secondary | ICD-10-CM

## 2023-10-16 DIAGNOSIS — E1159 Type 2 diabetes mellitus with other circulatory complications: Secondary | ICD-10-CM | POA: Diagnosis not present

## 2023-10-16 DIAGNOSIS — E1169 Type 2 diabetes mellitus with other specified complication: Secondary | ICD-10-CM | POA: Diagnosis not present

## 2023-10-16 DIAGNOSIS — Z6828 Body mass index (BMI) 28.0-28.9, adult: Secondary | ICD-10-CM | POA: Diagnosis not present

## 2023-10-16 DIAGNOSIS — I152 Hypertension secondary to endocrine disorders: Secondary | ICD-10-CM

## 2023-10-16 DIAGNOSIS — E1165 Type 2 diabetes mellitus with hyperglycemia: Secondary | ICD-10-CM | POA: Diagnosis not present

## 2023-10-16 MED ORDER — METFORMIN HCL 500 MG PO TABS
500.0000 mg | ORAL_TABLET | Freq: Every day | ORAL | 0 refills | Status: AC
  Filled 2023-10-16: qty 30, 30d supply, fill #0

## 2023-10-16 MED ORDER — TIRZEPATIDE 12.5 MG/0.5ML ~~LOC~~ SOAJ
12.5000 mg | SUBCUTANEOUS | 1 refills | Status: AC
Start: 2023-10-16 — End: ?
  Filled 2023-10-16: qty 2, 28d supply, fill #0
  Filled 2023-11-14: qty 2, 28d supply, fill #1

## 2023-10-23 ENCOUNTER — Telehealth (INDEPENDENT_AMBULATORY_CARE_PROVIDER_SITE_OTHER): Payer: Self-pay

## 2023-10-23 NOTE — Telephone Encounter (Signed)
 Mounjaro approval  This letter is to notify you that your prior authorization request for MOUNJARO 2.5 MG/0.5  ML PEN has been approved based upon the information we received from your healthcare practitioner.   Benefits for all services are subject to terms, conditions and eligibility as outlined in the  benefit documentation in effect at the time services are provided.  The authorization is effective from 10/23/2023 to 10/22/2024, as long as you are enrolled as  a member of your current health plan. The request was approved with a quantity restriction. This has been approved for a max daily dosage of 0.071.

## 2023-11-14 ENCOUNTER — Other Ambulatory Visit (HOSPITAL_COMMUNITY): Payer: Self-pay

## 2023-11-14 NOTE — Progress Notes (Unsigned)
 SUBJECTIVE: Discussed the use of AI scribe software for clinical note transcription with the patient, who gave verbal consent to proceed.  Chief Complaint: Obesity  Interim History: She is down 3 lbs since her last visit.  Down 33 lbs overall TBW loss of 15.5%  Kimberly Boyd is here to discuss her progress with her obesity treatment plan. She is on the practicing portion control and making smarter food choices, such as increasing vegetables and decreasing simple carbohydrates and states she is following her eating plan approximately 75 % of the time. She states she is exercising walking 30 minutes 3 times per week.  Kimberly Boyd is a 58 year old female with obesity who presents for follow-up of her obesity treatment plan.  She is adhering to a portion control Smart Choices nutrition plan approximately 75% of the time and has lost three pounds since her last visit. She consumes close to the recommended amounts of protein but feels she is not drinking enough water or eating enough whole foods. She skips some meals and does not consistently sleep seven to nine hours nightly.  She exercises by walking for thirty minutes three days per week.  In the mornings, she typically drinks a Chobani Complete yogurt with 20 grams of protein instead of eating a traditional breakfast due to lack of hunger. She has lost a total of 33 pounds and currently weighs 180 pounds, with a goal weight of 175 pounds.  We discussed and she is considering adding a weighted vest or ankle weights to her exercise routine, as she has friends who have recently started using them.   She is on a half dose of blood pressure medication and has been adjusting her medication schedule to every other day. Her BP is running low today at 96-67. She is also on propranolol , which she feels is necessary for her heart. She has a primary care appointment coming up  and we plan to discuss her blood pressure management further with her  PCP.  She experiences hot flashes at night, which she associates with menopause and sugar intake. She is not on hormone replacement therapy and manages her symptoms without it. She is not currently taking Linzess  and omeprazole , as she feels she does not need them at this time but is keeping them on her medication list for potential rare use.   She takes vitamin D  sporadically and is not currently taking vitamin C. She plans to start elderberry in October for the winter months. OBJECTIVE: Visit Diagnoses: Problem List Items Addressed This Visit     Stress   Hypertension associated with diabetes (HCC)   Relevant Medications   tirzepatide  (MOUNJARO ) 12.5 MG/0.5ML Pen   Generalized obesity   Relevant Medications   tirzepatide  (MOUNJARO ) 12.5 MG/0.5ML Pen   Vitamin D  insufficiency   Type 2 diabetes mellitus with hyperglycemia, without long-term current use of insulin  (HCC) New diagnosis - Primary   Relevant Medications   tirzepatide  (MOUNJARO ) 12.5 MG/0.5ML Pen   Other Visit Diagnoses       Hyperlipidemia associated with type 2 diabetes mellitus (HCC)       Relevant Medications   tirzepatide  (MOUNJARO ) 12.5 MG/0.5ML Pen     Sleep disorder, unspecified         At risk for cancer       Relevant Orders   CEA     BMI 28.0-28.9,adult Current BMI 28.3          Obesity Obesity management is ongoing with a  focus on weight loss and muscle maintenance. Weight has decreased by 33 pounds, currently at 180 pounds, with a goal of reaching 175 pounds.  Portion control and Smart Choices nutrition plan are followed approximately 75% of the time. Protein intake is close to recommended levels, but hydration and whole food consumption need improvement.  Exercise includes walking 30 minutes three days per week.  Consideration of weighted vest or ankle weights to enhance muscle engagement during exercise. The body may resist further weight loss as it adapts to the new weight, necessitating additional  strategies to sustain weight loss= Adaptive thermogenesis - Continue portion control Smart Choices nutrition plan - Increase water intake - Increase whole food consumption - Consider weighted vest or ankle weights for exercise - Continue walking 30 minutes three days per week  Type 2 Diabetes Mellitus with hyperglycemia, without long-term current use of insulin  HgbA1c is at goal. Last A1c was 5.7  Medication(s): Mounjaro  12.5 mg SQ weekly and Metformin  500 mg once daily breakfast management includes GLP-1 medication (Mounjaro ) and metformin . Appetite suppression is effective, with increased protein cravings noted. Blood pressure management may need adjustment due to weight loss and potential changes in medication requirements. No GI side effects with medications Denies mass in neck, dysphagia, dyspepsia, persistent hoarseness, abdominal pain, or N/V/Constipation or diarrhea. Has annual eye exam. Mood is stable.   Lab Results  Component Value Date   HGBA1C 5.7 (H) 03/27/2023   HGBA1C 6.5 (H) 10/26/2022   HGBA1C 6.0 (H) 11/18/2021   Lab Results  Component Value Date   LDLCALC 132 (H) 07/17/2023   CREATININE 0.87 07/17/2023   No results found for: GFR  Plan: Continue and refill Mounjaro  12.5 mg SQ weekly - Monitor blood pressure regularly - Discuss potential adjustment of blood pressure medications with PCP - Consider ACE or ARB for renal preservation -She is working  on nutrition plan to decrease simple carbohydrates, increase lean proteins and exercise to promote weight loss and improve glycemic control . Meds ordered this encounter  Medications   tirzepatide  (MOUNJARO ) 12.5 MG/0.5ML Pen    Sig: Inject 12.5 mg into the skin once a week.    Dispense:  2 mL    Refill:  1     Hypertension Hypertension asymptomatic, no significant medication side effects noted, and BP lower with significant weight loss and treatment with Mounjaro  for T2DM.  Medication(s):  triamterene -hydrochlorothiazide  37.5-25 mg 1/2 tab daily.                         Propranolol  SR 120 mg daily for palpitations.   BP Readings from Last 3 Encounters:  11/15/23 96/67  10/16/23 110/73  09/04/23 131/83   Lab Results  Component Value Date   CREATININE 0.87 07/17/2023   CREATININE 0.83 04/24/2023   CREATININE 0.86 03/27/2023   No results found for: GFR  Plan: Blood pressure appears low, possibly due to current medication regimen. Triamterene  hydrochlorothiazide  may be discontinued until further evaluation by PCP. Propranolol  is prioritized for heart management. Weight loss may have impacted blood pressure control, necessitating reevaluation of medication needs. A blood pressure log is recommended to assist in medication adjustment discussions with PCP. - Stop triamterene  hydrochlorothiazide  until PCP evaluation next week and log BP at home off Triamterene  hydrochlorothiazide  for now until follow up with PCP.  May be able to stay on propranolol  only- Not currently on ACE or ARB and if medication needed, may want low dose of ACE or ARB given  T2 DM. Will defer to PCP>  - Monitor blood pressure regularly - Discuss blood pressure management with PCP -Continue to work on nutrition plan to promote weight loss and improve BP control.   Hyperlipidemia LDL is not at goal. Medication(s): Mounjaro    Not on statin Cardiovascular risk factors: diabetes mellitus, dyslipidemia, and hypertension  Lab Results  Component Value Date   CHOL 207 (H) 07/17/2023   HDL 64 07/17/2023   LDLCALC 132 (H) 07/17/2023   TRIG 62 07/17/2023   Lab Results  Component Value Date   ALT 19 07/17/2023   AST 19 07/17/2023   ALKPHOS 79 07/17/2023   BILITOT 0.4 07/17/2023   The 10-year ASCVD risk score (Arnett DK, et al., 2019) is: 4.8%   Values used to calculate the score:     Age: 52 years     Clincally relevant sex: Female     Is Non-Hispanic African American: Yes     Diabetic: Yes      Tobacco smoker: No     Systolic Blood Pressure: 96 mmHg     Is BP treated: Yes     HDL Cholesterol: 64 mg/dL     Total Cholesterol: 207 mg/dL  Plan: ASCVD risk < 5%, but may want statin if not improved on follow up labs with PCP.  On Mounjaro  which should decrease CV risks. Due to see PCP next week- [facility].  Continue to work on nutrition plan -decreasing simple carbohydrates, increasing lean proteins, decreasing saturated fats and cholesterol , avoiding trans fats and exercise as able to promote weight loss, improve lipids and decrease cardiovascular risks.   Vitamin D  Deficiency Vitamin D  is not at goal of 50.  Most recent vitamin D  level was 40.1. She is on OTC vitamin D  4000 units daily.- No N/V or muscle weakness. Does not always remember to take vitamin D .  Lab Results  Component Value Date   VD25OH 40.1 07/17/2023   VD25OH 46.5 03/27/2023   VD25OH 37.9 10/26/2022    Plan: Continue OTC vitamin D  4000 units daily Low vitamin D  levels can be associated with adiposity and may result in leptin resistance and weight gain. Also associated with fatigue.  Currently on vitamin D  supplementation without any adverse effects such as nausea, vomiting or muscle weakness.  Recheck vitamin D  level if not checked with AWV with PCP.   Stress Stress levels are high due to work-life balance challenges. Sleep disturbance contributes to stress. Strategies to manage stress and improve sleep like increasing exercise for stress reduction may be helpful.   Sleep disturbance Sleep disturbance is primarily due to hot flashes, potentially exacerbated by sugar intake. No hormone replacement therapy is currently used. Sleep quality is poor, impacting overall well-being. Hot flashes may be linked to sugar intake, particularly in the context of type 2 diabetes. - Reduce sugar intake to minimize hot flashes Use fans and keep room cool for sleeping to help with hot flashes.  Avoiding caffeine intake especially  later during the day.  At risk for cancer Reports strong family history of cancers. Would like to check marker- CEA today.  Plan: Check CEA today as requested.  Has AWV with PCP next week at [facility] and anticipates lab check at this time for other appropriate labs.    Vitals Temp: 98.2 F (36.8 C) BP: 96/67 Pulse Rate: 76 SpO2: 100 %   Anthropometric Measurements Height: 5' 7 (1.702 m) Weight: 180 lb (81.6 kg) BMI (Calculated): 28.19 Weight at Last Visit: 183lb Weight Lost  Since Last Visit: 3lb Weight Gained Since Last Visit: 0 Starting Weight: 213lb Total Weight Loss (lbs): 33 lb (15 kg) Peak Weight: 220lb   Body Composition  Body Fat %: 39.4 % Fat Mass (lbs): 71.2 lbs Muscle Mass (lbs): 104 lbs Total Body Water (lbs): 72.8 lbs Visceral Fat Rating : 9   Other Clinical Data Fasting: yes Labs: no Today's Visit #: 31 Starting Date: 05/12/20     ASSESSMENT AND PLAN:  Diet: Kimberly Boyd is currently in the action stage of change. As such, her goal is to continue with weight loss efforts. She has agreed to practicing portion control and making smarter food choices, such as increasing vegetables and decreasing simple carbohydrates.  Exercise: Kimberly Boyd has been instructed to work up to a goal of 150 minutes of combined cardio and strengthening exercise per week for weight loss and overall health benefits.   Behavior Modification:  We discussed the following Behavioral Modification Strategies today: increasing lean protein intake, decreasing simple carbohydrates, increasing vegetables, increase H2O intake, increase high fiber foods, no skipping meals, meal planning and cooking strategies, emotional eating strategies , avoiding temptations, and planning for success. We discussed various medication options to help Kimberly Boyd with her weight loss efforts and we both agreed to continue current treatment plan.  Return in about 4 weeks (around 12/13/2023).Kimberly Boyd She was informed of the  importance of frequent follow up visits to maximize her success with intensive lifestyle modifications for her multiple health conditions.  Attestation Statements:   Reviewed by clinician on day of visit: allergies, medications, problem list, medical history, surgical history, family history, social history, and previous encounter notes.   Time spent on visit including pre-visit chart review and post-visit care and charting was 38 minutes.    Nayan Proch, PA-C

## 2023-11-15 ENCOUNTER — Other Ambulatory Visit (HOSPITAL_COMMUNITY): Payer: Self-pay

## 2023-11-15 ENCOUNTER — Ambulatory Visit (INDEPENDENT_AMBULATORY_CARE_PROVIDER_SITE_OTHER): Admitting: Physician Assistant

## 2023-11-15 ENCOUNTER — Encounter (INDEPENDENT_AMBULATORY_CARE_PROVIDER_SITE_OTHER): Payer: Self-pay | Admitting: Physician Assistant

## 2023-11-15 VITALS — BP 96/67 | HR 76 | Temp 98.2°F | Ht 67.0 in | Wt 180.0 lb

## 2023-11-15 DIAGNOSIS — E1169 Type 2 diabetes mellitus with other specified complication: Secondary | ICD-10-CM

## 2023-11-15 DIAGNOSIS — E1159 Type 2 diabetes mellitus with other circulatory complications: Secondary | ICD-10-CM | POA: Diagnosis not present

## 2023-11-15 DIAGNOSIS — E559 Vitamin D deficiency, unspecified: Secondary | ICD-10-CM

## 2023-11-15 DIAGNOSIS — G479 Sleep disorder, unspecified: Secondary | ICD-10-CM | POA: Diagnosis not present

## 2023-11-15 DIAGNOSIS — E669 Obesity, unspecified: Secondary | ICD-10-CM

## 2023-11-15 DIAGNOSIS — E1165 Type 2 diabetes mellitus with hyperglycemia: Secondary | ICD-10-CM | POA: Diagnosis not present

## 2023-11-15 DIAGNOSIS — E785 Hyperlipidemia, unspecified: Secondary | ICD-10-CM | POA: Diagnosis not present

## 2023-11-15 DIAGNOSIS — F439 Reaction to severe stress, unspecified: Secondary | ICD-10-CM | POA: Diagnosis not present

## 2023-11-15 DIAGNOSIS — Z9189 Other specified personal risk factors, not elsewhere classified: Secondary | ICD-10-CM

## 2023-11-15 DIAGNOSIS — Z6828 Body mass index (BMI) 28.0-28.9, adult: Secondary | ICD-10-CM

## 2023-11-15 DIAGNOSIS — Z7985 Long-term (current) use of injectable non-insulin antidiabetic drugs: Secondary | ICD-10-CM

## 2023-11-15 DIAGNOSIS — I152 Hypertension secondary to endocrine disorders: Secondary | ICD-10-CM

## 2023-11-15 MED ORDER — TIRZEPATIDE 12.5 MG/0.5ML ~~LOC~~ SOAJ
12.5000 mg | SUBCUTANEOUS | 1 refills | Status: DC
  Filled 2023-11-15: qty 2, 28d supply, fill #0

## 2023-11-16 LAB — CEA: CEA: 1.2 ng/mL (ref 0.0–4.7)

## 2023-11-17 DIAGNOSIS — R7303 Prediabetes: Secondary | ICD-10-CM | POA: Diagnosis not present

## 2023-11-17 DIAGNOSIS — E7800 Pure hypercholesterolemia, unspecified: Secondary | ICD-10-CM | POA: Diagnosis not present

## 2023-11-17 DIAGNOSIS — I10 Essential (primary) hypertension: Secondary | ICD-10-CM | POA: Diagnosis not present

## 2023-11-20 ENCOUNTER — Other Ambulatory Visit (HOSPITAL_COMMUNITY): Payer: Self-pay

## 2023-11-20 DIAGNOSIS — Z6828 Body mass index (BMI) 28.0-28.9, adult: Secondary | ICD-10-CM | POA: Diagnosis not present

## 2023-11-20 DIAGNOSIS — E7800 Pure hypercholesterolemia, unspecified: Secondary | ICD-10-CM | POA: Diagnosis not present

## 2023-11-20 DIAGNOSIS — Z0000 Encounter for general adult medical examination without abnormal findings: Secondary | ICD-10-CM | POA: Diagnosis not present

## 2023-11-20 DIAGNOSIS — F419 Anxiety disorder, unspecified: Secondary | ICD-10-CM | POA: Diagnosis not present

## 2023-11-20 DIAGNOSIS — I10 Essential (primary) hypertension: Secondary | ICD-10-CM | POA: Diagnosis not present

## 2023-11-20 DIAGNOSIS — R002 Palpitations: Secondary | ICD-10-CM | POA: Diagnosis not present

## 2023-11-20 DIAGNOSIS — K5909 Other constipation: Secondary | ICD-10-CM | POA: Diagnosis not present

## 2023-11-20 DIAGNOSIS — Z23 Encounter for immunization: Secondary | ICD-10-CM | POA: Diagnosis not present

## 2023-11-20 MED ORDER — PROPRANOLOL HCL ER 80 MG PO CP24
80.0000 mg | ORAL_CAPSULE | Freq: Every day | ORAL | 4 refills | Status: DC
  Filled 2023-11-20: qty 90, 90d supply, fill #0

## 2023-11-20 MED ORDER — TRIAMTERENE-HCTZ 37.5-25 MG PO TABS
0.5000 | ORAL_TABLET | Freq: Every morning | ORAL | 4 refills | Status: DC
  Filled 2023-11-20: qty 45, 90d supply, fill #0

## 2023-11-21 ENCOUNTER — Other Ambulatory Visit: Payer: Self-pay

## 2023-11-24 ENCOUNTER — Other Ambulatory Visit (HOSPITAL_BASED_OUTPATIENT_CLINIC_OR_DEPARTMENT_OTHER): Payer: Self-pay | Admitting: Family Medicine

## 2023-11-24 DIAGNOSIS — E7800 Pure hypercholesterolemia, unspecified: Secondary | ICD-10-CM

## 2023-11-25 ENCOUNTER — Other Ambulatory Visit (HOSPITAL_COMMUNITY): Payer: Self-pay

## 2023-11-30 ENCOUNTER — Other Ambulatory Visit (HOSPITAL_COMMUNITY): Payer: Self-pay

## 2023-12-06 NOTE — Progress Notes (Unsigned)
  Cardiology Office Note:  .   Date:  12/06/2023  ID:  Kimberly Boyd, DOB October 04, 1965, MRN [id number] PCP: Kimberly Gwenn SQUIBB, MD (Inactive)  [facility] Providers Cardiologist:  Kimberly Sage, MD {  History of Present Illness: .   Kimberly Boyd is a 58 y.o. female w/PMHx of  HTN, GERD, DM SVT PACs, PVCs  Tendency toward hypokalemia  Last seen back in 2023 by Dr. Sage, discussed episodes of palpitations, one that she called EMS for and another prompting ER visit, both noting PACs ER ECG showed PACs, her AliveCor monitor shows PVCs. Also had nonsustained SVT on a number of the strips  Propranolol  continued, added low dose magnesium  Discussed flecainide  if no improvement, as well as benefits of biofeedback given the degree of her anxiety response to the ectopy/palpitations   Today's visit is scheduled as a 6 mo visit ROS:   She is a case Chemical engineer with our system In the last year she has been working with weight/wellness, has had success in losing 40lbs! With that her BPs have trickled downward and she initially self reduced her meds, then more recently saw her PMD Her propranolol  reduced to 80mg , maintained on 1/2 tab of her triamterene . Unfortunately since then her burden of palpitations has been on the rise. They are fairly brief, but can last a few minutes Rates towards 120's They are anxiety provoking, and she tries to not let them get her to anxious with them Vagal maneuvers have helped.  She is otherwise feeling very well! Active without limitations  Recent labs via her PMD  (Viewed on her phone/[facility] chart 11/17/23 K+ 4.1 BUN/Creat 15/0.7   Studies Reviewed: SABRA    EKG done today and reviewed by myself:  SR 75bpm  04/20/2021: TTE 1. Left ventricular ejection fraction, by estimation, is 60 to 65%. The  left ventricle has normal function. The left ventricle has no regional  wall motion abnormalities. Left ventricular diastolic parameters were  normal.   2.  Right ventricular systolic function is normal. The right ventricular  size is normal.   3. The mitral valve is normal in structure. Trivial mitral valve  regurgitation. No evidence of mitral stenosis.   4. Tricuspid valve regurgitation is mild to moderate.   5. The aortic valve is normal in structure. Aortic valve regurgitation is  not visualized. No aortic stenosis is present.   6. The inferior vena cava is normal in size with greater than 50%  respiratory variability, suggesting right atrial pressure of 3 mmHg.     Risk Assessment/Calculations:    Physical Exam:   VS:  There were no vitals taken for this visit.   Wt Readings from Last 3 Encounters:  11/15/23 180 lb (81.6 kg)  10/16/23 183 lb (83 kg)  09/04/23 185 lb (83.9 kg)    GEN: Well nourished, well developed in no acute distress NECK: No JVD; No carotid bruits CARDIAC: RRR, no murmurs, rubs, gallops RESPIRATORY:  CTA b/l without rales, wheezing or rhonchi  ABDOMEN: Soft, non-tender, non-distended EXTREMITIES: No edema; No deformity    ASSESSMENT AND PLAN: .    Palpitations SVT PACs/PVCs Will resume Propranolol  to 120mg  and stop her triamterene  She will make Kardia tracings with her symptoms  Advised adequate hydration and sleep Mag level today  She would like to transition to Dr. Inocencio  Dispo: I will have her back in 62mo, sooner if needed  Signed, Charlies Macario Arthur, PA-C

## 2023-12-07 ENCOUNTER — Encounter: Payer: Self-pay | Admitting: Physician Assistant

## 2023-12-07 ENCOUNTER — Ambulatory Visit: Attending: Cardiology | Admitting: Physician Assistant

## 2023-12-07 ENCOUNTER — Other Ambulatory Visit (HOSPITAL_COMMUNITY): Payer: Self-pay

## 2023-12-07 VITALS — BP 108/70 | HR 75 | Ht 67.0 in | Wt 186.6 lb

## 2023-12-07 DIAGNOSIS — Z79899 Other long term (current) drug therapy: Secondary | ICD-10-CM | POA: Diagnosis not present

## 2023-12-07 DIAGNOSIS — I493 Ventricular premature depolarization: Secondary | ICD-10-CM

## 2023-12-07 DIAGNOSIS — R002 Palpitations: Secondary | ICD-10-CM | POA: Diagnosis not present

## 2023-12-07 DIAGNOSIS — I491 Atrial premature depolarization: Secondary | ICD-10-CM

## 2023-12-07 DIAGNOSIS — I471 Supraventricular tachycardia, unspecified: Secondary | ICD-10-CM | POA: Diagnosis not present

## 2023-12-07 MED ORDER — PROPRANOLOL HCL ER 120 MG PO CP24
120.0000 mg | ORAL_CAPSULE | Freq: Every day | ORAL | 3 refills | Status: AC
  Filled 2023-12-07 (×2): qty 90, 90d supply, fill #0
  Filled 2024-03-21: qty 90, 90d supply, fill #1

## 2023-12-07 NOTE — Patient Instructions (Addendum)
 Medication Instructions:    Your physician has recommended you make the following change in your medication:    START TAKING: INCREASE: Propanolol to 120mg  daily  STOP TAKING AND REMOVE THIS MEDICATION FROM YOUR MEDICATION LIST:   Triamterene -hydrochlorothiazide    *If you need a refill on your cardiac medications before your next appointment, please call your pharmacy*   Lab Work:  PLEASE GO DOWN STAIRS  [facility]  FIRST FLOOR   ( GET OFF ELEVATORS WALK TOWARDS WAITING AREA LAB LOCATED BY PHARMACY):   Magnesium     If you have labs (blood work) drawn today and your tests are completely normal, you will receive your results only by: MyChart Message (if you have MyChart) OR A paper copy in the mail If you have any lab test that is abnormal or we need to change your treatment, we will call you to review the results.    Testing/Procedures:  NONE ORDERED  TODAY      Follow-Up: At [facility], you and your health needs are our priority.  As part of our continuing mission to provide you with exceptional heart care, our providers are all part of one team.  This team includes your primary Cardiologist (physician) and Advanced Practice Providers or APPs (Physician Assistants and Nurse Practitioners) who all work together to provide you with the care you need, when you need it.  Your next appointment:  in 2 months  Provider:  You may see  Charlies Arthur, PA-C ( CONTACT  CASSIE HALL/ ANGELINE HAMMER FOR EP SCHEDULING ISSUES )    We recommend signing up for the patient portal called MyChart.  Sign up information is provided on this After Visit Summary.  MyChart is used to connect with patients for Virtual Visits (Telemedicine).  Patients are able to view lab/test results, encounter notes, upcoming appointments, etc.  Non-urgent messages can be sent to your provider as well.   To learn more about what you can do with MyChart, go to [email or web address].   Other Instructions

## 2023-12-08 ENCOUNTER — Ambulatory Visit: Payer: Self-pay

## 2023-12-08 ENCOUNTER — Other Ambulatory Visit (HOSPITAL_COMMUNITY): Payer: Self-pay

## 2023-12-08 LAB — MAGNESIUM: Magnesium: 2.2 mg/dL (ref 1.6–2.3)

## 2023-12-10 NOTE — Progress Notes (Unsigned)
 SUBJECTIVE: Discussed the use of AI scribe software for clinical note transcription with the patient, who gave verbal consent to proceed.  Chief Complaint: Obesity  Interim History: She is up 1 lb since her last visit.  Down 32 lbs overall TBW Loss of 15.0%  Seth is here to discuss her progress with her obesity treatment plan. She is on the practicing portion control and making smarter food choices, such as increasing vegetables and decreasing simple carbohydrates and states she is following her eating plan approximately 75 % of the time. She states she is exercising walking 30 minutes 3 times per week. Martita E Napoles Naiyah Zelenak is a 58 year old female with obesity, type 2 diabetes, hypertension, and hyperlipidemia who presents for follow-up on her obesity treatment plan.  She has been on Mounjaro  12.5 mg weekly for T2DM. She has experienced a weight gain of one pound. She is actively managing her weight through portion control and increased physical activity, engaging in exercise at least two days a week.  She has a history of type 2 diabetes and is currently taking Mounjaro  12.5 mg weekly and metformin  every other day or so depending on GI side effects. She experienced gastrointestinal side effects, including episodes of diarrhea within the first two days of taking Mounjaro , followed by constipation later in the week. She has not taken Linzess  for constipation in over a month.  Her hypertension management has been adjusted recently. Her propranolol  dose was increased back to 120 mg due to breakthrough heart palpitations, which occur during the night and cause anxiety. Her heart rate was elevated to 120 bpm while at rest, prompting her to request the dose increase. No dizziness or atrial fibrillation symptoms. Triamterene  was discontinued (due to her BP running low with her significant weight loss and treatment with Mounjaro ,) affecting her fluid balance.  She reports poor sleep quality,  averaging four to five hours per night, with frequent awakenings due to feeling hot or needing to use the bathroom. She finds it difficult to fall back asleep and often uses her phone or Kindle to distract herself. She has not been using any sleep medications.  In terms of social history, she has reduced her alcohol intake significantly, as she feels it affects her heart rate. She is mindful of her diet, focusing on protein intake and has noticed a decreased preference for bread. She is considering incorporating a weighted vest into her walking routine to enhance her physical activity.  OBJECTIVE: Visit Diagnoses: Problem List Items Addressed This Visit     PALPITATIONS   Hypertension associated with diabetes (HCC)   Relevant Medications   tirzepatide  (MOUNJARO ) 12.5 MG/0.5ML Pen   Generalized obesity   Relevant Medications   tirzepatide  (MOUNJARO ) 12.5 MG/0.5ML Pen   Type 2 diabetes mellitus with hyperglycemia, without long-term current use of insulin  (HCC) New diagnosis - Primary   Relevant Medications   tirzepatide  (MOUNJARO ) 12.5 MG/0.5ML Pen   Other Visit Diagnoses       Sleep disorder, unspecified         Alternating constipation and diarrhea         BMI 28.0-28.9,adult Current BMI 28.4         Obesity Weight has increased by one pound due to medication changes and fluid retention. Muscle mass is stable, with a slight increase in adipose tissue and water weight. Stopped diuretic.  Current weight loss is 32 pounds. Adherence to portion control and increased physical activity is noted. Beta blockers may impact  weight loss efforts- propranolol  for palpitations was increased back to previous dosage due to increased palpitations on lower dose. - Refill Mounjaro  at 12.5 mg. - Encourage regular weightlifting or use of a weighted vest during walking. - Focus on protein intake, aiming for 30 grams per meal.  Type 2 diabetes mellitus Managed with Mounjaro  12.5 mg weekly. and metformin   500 mg every few days- Metformin  is not taken daily due to gastrointestinal side effects. Denies mass in neck, dysphagia, dyspepsia, persistent hoarseness, abdominal pain, or N/V/Constipation or diarrhea. Has annual eye exam. Mood is stable.   Lab Results  Component Value Date   HGBA1C 5.7 (H) 03/27/2023   HGBA1C 6.5 (H) 10/26/2022   HGBA1C 6.0 (H) 11/18/2021   Lab Results  Component Value Date   LDLCALC 132 (H) 07/17/2023   CREATININE 0.87 07/17/2023  She is working  on nutrition plan to decrease simple carbohydrates, increase lean proteins and exercise to promote weight loss and improve glycemic control . - Continue/refill Mounjaro  at 12.5 mg. - Adjust metformin  500 mg intake as tolerated. Meds ordered this encounter  Medications   tirzepatide  (MOUNJARO ) 12.5 MG/0.5ML Pen    Sig: Inject 12.5 mg into the skin once a week.    Dispense:  2 mL    Refill:  1    Hypertension Well-controlled with recent medication adjustments. Propranolol  increased to 120 mg due to breakthrough palpitations. Triamterene  discontinued due to low blood pressure. Blood pressure and heart rate are stable today BP Readings from Last 3 Encounters:  12/11/23 126/82  12/07/23 108/70  11/15/23 96/67   Lab Results  Component Value Date   NA 140 07/17/2023   CL 102 07/17/2023   K 3.8 07/17/2023   CO2 21 07/17/2023   BUN 15 07/17/2023   CREATININE 0.87 07/17/2023   EGFR 78 07/17/2023   CALCIUM 10.1 07/17/2023   ALBUMIN 4.4 07/17/2023   GLUCOSE 85 07/17/2023   Continue to work on nutrition plan to promote weight loss and improve BP control.  - Continue propranolol  at 120 mg. - Monitor blood pressure and heart rate on GLP-1 RA/GIP medication.  No current symptoms of hypotension, but some residual palpitations as noted when on lower dose of propranolol . Monitor  Palpitations More frequent palpitations, particularly at night, without dizziness or atrial fibrillation symptoms. Thyroid  function previously  normal. Prefers to monitor symptoms before rechecking thyroid  function. - Continue propranolol  at 120 mg. - Monitor for palpitations and symptoms. - Consider rechecking thyroid  function if symptoms persist.  Sleep disturbance Ongoing poor sleep quality with frequent awakenings, 4-5 hours of sleep per night, interruptions due to discomfort and nocturia. Stress and lack of sleep contribute to fatigue. Some palpitations during night which worsened with decrease in propranolol  dose and this has been increased to usual dose.  - Encourage regular physical activity, including weightlifting or use of a weighted vest. - Consider circadian rhythm reset by getting sunlight exposure during the day. -monitor sleep back on higher dose of propranolol .  - May benefit from journaling.  - May benefit from a sleep app  Constipation and intermittent diarrhea Intermittent diarrhea within the first two days after Mounjaro , followed by constipation. Linzess  not currently used. Cautious about metformin  intake due to gastrointestinal side effects. - Monitor bowel habits and correlate with Mounjaro  administration. - Adjust metformin  intake as tolerated.   Follow-up Follow-up appointment scheduled to monitor progress and adjust treatment as needed. - Schedule follow-up appointment on Wednesday, November 5th at 7:30 AM.  Vitals Temp: 98.4 F (  36.9 C) BP: 126/82 Pulse Rate: 74 SpO2: 97 %   Anthropometric Measurements Height: 5' 7 (1.702 m) Weight: 181 lb (82.1 kg) BMI (Calculated): 28.34 Weight at Last Visit: 180 lb Weight Lost Since Last Visit: 0 Weight Gained Since Last Visit: 1 lb Starting Weight: 213 lb Total Weight Loss (lbs): 32 lb (14.5 kg) Peak Weight: 220 lb   Body Composition  Body Fat %: 39.8 % Fat Mass (lbs): 72.2 lbs Muscle Mass (lbs): 103.8 lbs Total Body Water (lbs): 73 lbs Visceral Fat Rating : 9   Other Clinical Data Fasting: No Labs: No Today's Visit #: 32 Starting Date:  05/12/20     ASSESSMENT AND PLAN:  Diet: Anjanae is currently in the action stage of change. As such, her goal is to continue with weight loss efforts. She has agreed to practicing portion control and making smarter food choices, such as increasing vegetables and decreasing simple carbohydrates.  Exercise: Kassadee has been instructed to continue exercising as is and consider adding weighted vest  for weight loss and overall health benefits.   Behavior Modification:  We discussed the following Behavioral Modification Strategies today: increasing lean protein intake, decreasing simple carbohydrates, increasing vegetables, increase H2O intake, increase high fiber foods, no skipping meals, meal planning and cooking strategies, celebration eating strategies, avoiding temptations, and planning for success. We discussed various medication options to help Larie with her weight loss efforts and we both agreed to continue current treatment plan.  Return in about 5 weeks (around 01/15/2024).SABRA She was informed of the importance of frequent follow up visits to maximize her success with intensive lifestyle modifications for her multiple health conditions.  Attestation Statements:   Reviewed by clinician on day of visit: allergies, medications, problem list, medical history, surgical history, family history, social history, and previous encounter notes.   Time spent on visit including pre-visit chart review and post-visit care and charting was 39 minutes.    Demosthenes Virnig, PA-C

## 2023-12-11 ENCOUNTER — Encounter (INDEPENDENT_AMBULATORY_CARE_PROVIDER_SITE_OTHER): Payer: Self-pay | Admitting: Physician Assistant

## 2023-12-11 ENCOUNTER — Ambulatory Visit (INDEPENDENT_AMBULATORY_CARE_PROVIDER_SITE_OTHER): Admitting: Physician Assistant

## 2023-12-11 ENCOUNTER — Other Ambulatory Visit (HOSPITAL_COMMUNITY): Payer: Self-pay

## 2023-12-11 VITALS — BP 126/82 | HR 74 | Temp 98.4°F | Ht 67.0 in | Wt 181.0 lb

## 2023-12-11 DIAGNOSIS — G479 Sleep disorder, unspecified: Secondary | ICD-10-CM

## 2023-12-11 DIAGNOSIS — Z6828 Body mass index (BMI) 28.0-28.9, adult: Secondary | ICD-10-CM | POA: Diagnosis not present

## 2023-12-11 DIAGNOSIS — E1159 Type 2 diabetes mellitus with other circulatory complications: Secondary | ICD-10-CM

## 2023-12-11 DIAGNOSIS — E669 Obesity, unspecified: Secondary | ICD-10-CM

## 2023-12-11 DIAGNOSIS — R198 Other specified symptoms and signs involving the digestive system and abdomen: Secondary | ICD-10-CM

## 2023-12-11 DIAGNOSIS — Z7984 Long term (current) use of oral hypoglycemic drugs: Secondary | ICD-10-CM

## 2023-12-11 DIAGNOSIS — Z7985 Long-term (current) use of injectable non-insulin antidiabetic drugs: Secondary | ICD-10-CM

## 2023-12-11 DIAGNOSIS — R197 Diarrhea, unspecified: Secondary | ICD-10-CM

## 2023-12-11 DIAGNOSIS — R002 Palpitations: Secondary | ICD-10-CM | POA: Diagnosis not present

## 2023-12-11 DIAGNOSIS — E1165 Type 2 diabetes mellitus with hyperglycemia: Secondary | ICD-10-CM

## 2023-12-11 DIAGNOSIS — I152 Hypertension secondary to endocrine disorders: Secondary | ICD-10-CM | POA: Diagnosis not present

## 2023-12-11 DIAGNOSIS — K5900 Constipation, unspecified: Secondary | ICD-10-CM

## 2023-12-11 DIAGNOSIS — E559 Vitamin D deficiency, unspecified: Secondary | ICD-10-CM

## 2023-12-11 DIAGNOSIS — E1169 Type 2 diabetes mellitus with other specified complication: Secondary | ICD-10-CM

## 2023-12-11 MED ORDER — TIRZEPATIDE 12.5 MG/0.5ML ~~LOC~~ SOAJ
12.5000 mg | SUBCUTANEOUS | 1 refills | Status: DC
  Filled 2023-12-11: qty 2, 28d supply, fill #0
  Filled 2024-01-16: qty 2, 28d supply, fill #1

## 2023-12-31 NOTE — Progress Notes (Deleted)
 Office: [phone]  /  Fax: [phone]  Weight Summary And Biometrics  No data recorded No data recorded No data recorded  No data recorded No data recorded No data recorded  Subjective   Chief Complaint: Obesity  Kimberly Boyd is here to discuss her progress with her obesity treatment plan. She is on the {MWMwtlossportion/plan2:23431} and states she is following her eating plan approximately *** % of the time. She states she is exercising *** minutes *** times per week.  Weight Progress Since Last Visit:  Since last office visit she has {emweight change:30888}. She reports {EMADHERENCE:28838::good adherence to reduced calorie nutritional plan.}  Reports following nutritional recommendations with {emnutritionhpi:32307} Uses {emtracking:32308} to track intake.  Engages in {emactivity:32309} physical activity.  Reports {emincreasedactivity:32310} .  She has been working on [employer] labels,not skipping meals,increasing protein intake at every meal,drinking more water,making healthier choices,reducing portion sizes,incorporating more whole foods}   Challenges affecting patient progress: {EMOBESITYBARRIERS:28841::none}.   Orexigenic Control: {Actions; denies-reports:32002} problems with appetite and hunger signals.  {Actions; denies-reports:32002} problems with satiety and satiation.  {Actions; denies-reports:32002} problems with eating patterns and portion control.  {Actions; denies-reports:32002} abnormal cravings. {Actions; denies-reports:32002} feeling deprived or restricted.   Pharmacotherapy for weight management: She is currently taking {EMPharmaco:28845}.   Assessment and Plan   Treatment Plan For Obesity:  Recommended Dietary Goals  Kimberly Boyd is currently in the action stage of change. As such, her goal is to continue weight management plan. She has agreed to: {EMWTLOSSPLAN:29297::continue current plan}  Behavioral  Health and Counseling  We discussed the following behavioral modification strategies today: {EMWMwtlossstrategies:28914::continue to work on maintaining a reduced calorie state, getting the recommended amount of protein, incorporating whole foods, making healthy choices, staying well hydrated and practicing mindfulness when eating.,increase protein intake, fibrous foods (25 grams per day for women, 30 grams for men) and water to improve satiety and decrease hunger signals. }.  Additional education and resources provided today: {EMadditionalresources:29169::None}  Recommended Physical Activity Goals  Kimberly Boyd has been advised to work up to 150 minutes of moderate intensity aerobic activity a week and strengthening exercises 2-3 times per week for cardiovascular health, weight loss maintenance and preservation of muscle mass.   She has agreed to :  {EMEXERCISE:28847::Think about enjoyable ways to increase daily physical activity and overcoming barriers to exercise,Increase physical activity in their day and reduce sedentary time (increase NEAT).,Increase volume of physical activity to a goal of 240 minutes a week,Combine aerobic and strengthening exercises for efficiency and improved cardiometabolic health.}  Pharmacotherapy  We discussed various medication options to help Kimberly Boyd with her weight loss efforts and we both agreed to : {EMagreedrx:29170}  Associated Conditions Impacted by Obesity Treatment  1. Type 2 diabetes mellitus with hyperglycemia, without long-term current use of insulin  (HCC) ***  2. Hypertension associated with diabetes (HCC) ***  3. Sleep disorder, unspecified ***  4. Hyperlipidemia associated with type 2 diabetes mellitus (HCC) ***  5. Vitamin D  insufficiency ***  6. Generalized obesity Start BMI 33.36 05/12/2020 ***    Objective   Physical Exam:  There were no vitals taken for this visit. There is no height or weight on file to calculate  BMI.  General: She is overweight, cooperative, alert, well developed, and in no acute distress. PSYCH: Has normal mood, affect and thought process.   HEENT: EOMI, sclerae are anicteric. Lungs: Normal breathing effort, no conversational dyspnea. Extremities: No edema.  Neurologic: No gross sensory or motor deficits. No tremors or fasciculations noted.    Diagnostic Data Reviewed:  BMET    Component Value Date/Time   NA 140 07/17/2023 0802   K 3.8 07/17/2023 0802   CL 102 07/17/2023 0802   CO2 21 07/17/2023 0802   GLUCOSE 85 07/17/2023 0802   GLUCOSE 141 (H) 02/20/2021 0107   BUN 15 07/17/2023 0802   CREATININE 0.87 07/17/2023 0802   CREATININE 0.61 08/01/2013 1414   CALCIUM 10.1 07/17/2023 0802   GFRNONAA >60 02/20/2021 0107   GFRNONAA >89 08/01/2013 1414   GFRAA >90 11/26/2013 0758   GFRAA >89 08/01/2013 1414   Lab Results  Component Value Date   HGBA1C 5.7 (H) 03/27/2023   HGBA1C 6.2 (H) 05/12/2020   Lab Results  Component Value Date   INSULIN  9.8 07/17/2023   INSULIN  12.4 05/12/2020   Lab Results  Component Value Date   TSH 1.610 10/26/2022   CBC    Component Value Date/Time   WBC 4.5 07/17/2023 0802   WBC 6.7 02/20/2021 0107   RBC 4.32 07/17/2023 0802   RBC 4.04 02/20/2021 0107   HGB 13.7 07/17/2023 0802   HCT 40.9 07/17/2023 0802   PLT 264 07/17/2023 0802   MCV 95 07/17/2023 0802   MCH 31.7 07/17/2023 0802   MCH 30.9 02/20/2021 0107   MCHC 33.5 07/17/2023 0802   MCHC 34.2 02/20/2021 0107   RDW 13.1 07/17/2023 0802   Iron Studies    Component Value Date/Time   FERRITIN 55 11/18/2021 0751   Lipid Panel     Component Value Date/Time   CHOL 207 (H) 07/17/2023 0802   TRIG 62 07/17/2023 0802   HDL 64 07/17/2023 0802   LDLCALC 132 (H) 07/17/2023 0802   Hepatic Function Panel     Component Value Date/Time   PROT 8.0 07/17/2023 0802   ALBUMIN 4.4 07/17/2023 0802   AST 19 07/17/2023 0802   ALT 19 07/17/2023 0802   ALKPHOS 79 07/17/2023 0802    BILITOT 0.4 07/17/2023 0802      Component Value Date/Time   TSH 1.610 10/26/2022 1030   Nutritional Lab Results  Component Value Date   VD25OH 40.1 07/17/2023   VD25OH 46.5 03/27/2023   VD25OH 37.9 10/26/2022    Follow-Up   No follow-ups on file.Kimberly Boyd She was informed of the importance of frequent follow up visits to maximize her success with intensive lifestyle modifications for her multiple health conditions.  Attestation Statement   Reviewed by clinician on day of visit: allergies, medications, problem list, medical history, surgical history, family history, social history, and previous encounter notes.     Lucas Parker, MD

## 2024-01-01 ENCOUNTER — Encounter (INDEPENDENT_AMBULATORY_CARE_PROVIDER_SITE_OTHER): Payer: Self-pay | Admitting: *Deleted

## 2024-01-01 ENCOUNTER — Ambulatory Visit (INDEPENDENT_AMBULATORY_CARE_PROVIDER_SITE_OTHER): Admitting: Physician Assistant

## 2024-01-01 DIAGNOSIS — E669 Obesity, unspecified: Secondary | ICD-10-CM

## 2024-01-01 DIAGNOSIS — E1159 Type 2 diabetes mellitus with other circulatory complications: Secondary | ICD-10-CM

## 2024-01-01 DIAGNOSIS — E1165 Type 2 diabetes mellitus with hyperglycemia: Secondary | ICD-10-CM

## 2024-01-01 DIAGNOSIS — E559 Vitamin D deficiency, unspecified: Secondary | ICD-10-CM

## 2024-01-01 DIAGNOSIS — E1169 Type 2 diabetes mellitus with other specified complication: Secondary | ICD-10-CM

## 2024-01-01 DIAGNOSIS — G479 Sleep disorder, unspecified: Secondary | ICD-10-CM

## 2024-01-06 ENCOUNTER — Other Ambulatory Visit (HOSPITAL_COMMUNITY): Payer: Self-pay

## 2024-01-06 MED ORDER — FLUZONE 0.5 ML IM SUSY
0.5000 mL | PREFILLED_SYRINGE | Freq: Once | INTRAMUSCULAR | 0 refills | Status: AC
  Filled 2024-01-06: qty 0.5, 1d supply, fill #0

## 2024-01-16 ENCOUNTER — Other Ambulatory Visit (HOSPITAL_COMMUNITY): Payer: Self-pay

## 2024-01-16 NOTE — Progress Notes (Unsigned)
 SUBJECTIVE: Discussed the use of AI scribe software for clinical note transcription with the patient, who gave verbal consent to proceed.  Chief Complaint: Obesity  Interim History: She is down 1 lb since her last visit.  Down 33 lbs overall TBW loss of 15.5 %  Personal goal weight of 175 lbs  Roselie is here to discuss her progress with her obesity treatment plan. She is on the practicing portion control and making smarter food choices, such as increasing vegetables and decreasing simple carbohydrates and states she is following her eating plan approximately 75 % of the time. She states she is exercising weight training/walking 30 minutes 2-3 times per week.  Vali E Carll Sarita Mcqueen is a 58 year old female with obesity who presents for follow-up on her obesity treatment plan.  She is following a portion Emergency Planning/management Officer plan and has lost a total of 33 pounds, with a 1-pound loss since her last visit. She adheres to the plan 75% of the time. She is not eating more whole foods but is consuming the recommended amount of protein. She struggles with drinking enough water and sometimes skips meals. Her current weight is 180 pounds, with a BMI of 28.2.  She engages in physical activity by walking and weightlifting for 30 minutes, two to three times per week.  She is not sleeping at least seven hours nightly and experiences low energy levels in the evenings, which she attributes to possible seasonal changes. She uses magnesium  supplements to aid sleep but avoids melatonin due to concerns about long-term effects.  She experiences occasional constipation, which she manages without regular use of Linzess , and has not taken it in about three months.   She is not currently taking metformin  but has it available for use as needed. She continues to take vitamin D  supplements.  OBJECTIVE: Visit Diagnoses: Problem List Items Addressed This Visit     Hypertension associated with diabetes  (HCC)   Relevant Medications   tirzepatide  (MOUNJARO ) 12.5 MG/0.5ML Pen   Generalized obesity   Relevant Medications   tirzepatide  (MOUNJARO ) 12.5 MG/0.5ML Pen   Vitamin D  insufficiency   Type 2 diabetes mellitus with hyperglycemia, without long-term current use of insulin  (HCC) New diagnosis - Primary   Relevant Medications   tirzepatide  (MOUNJARO ) 12.5 MG/0.5ML Pen   Other Visit Diagnoses       Sleep disorder, unspecified         Hyperlipidemia associated with type 2 diabetes mellitus (HCC)       Relevant Medications   tirzepatide  (MOUNJARO ) 12.5 MG/0.5ML Pen     BMI 28.0-28.9,adult Current BMI 28.2         Obesity Management is ongoing with a portion control Smart Choices plan. She has lost 33 pounds, currently weighing 180 pounds with a BMI of 28.2. She follows the plan 75% of the time, struggles with hydration and meal skipping, and does not meet the recommended sleep duration. Engages in walking and weightlifting 2-3 times per week. Adipose mass decreased from 72.2 to 70.8 pounds, with a body adipose percentage of 39% with goal of < 35%. Visceral adipose rating remains at 9. No significant side effects from current medications. - Continue portion control Smart Choices plan. - Encouraged increased water intake and regular meal consumption. - Advised maintaining current exercise regimen of walking and weightlifting. - Continue current medication regimen without changes.  Type 2 Diabetes Mellitus with hyperglycemia, without long-term current use of insulin  HgbA1c is at goal. Last A1c was 5.7  Medication(s): Mounjaro  12.5 mg SQ weekly  Denies mass in neck, dysphagia, dyspepsia, persistent hoarseness, abdominal pain, or N/V/Constipation or diarrhea. Has annual eye exam. Mood is stable.  Occasional constipation with minimal need for medication for management Was on metformin  but holding off currently due to GI side effects.  Lab Results  Component Value Date   HGBA1C 5.7 (H)  03/27/2023   HGBA1C 6.5 (H) 10/26/2022   HGBA1C 6.0 (H) 11/18/2021   Lab Results  Component Value Date   LDLCALC 132 (H) 07/17/2023   CREATININE 0.87 07/17/2023   No results found for: GFR  Plan: Continue and refill Mounjaro  12.5 mg SQ weekly She is working  on nutrition plan to decrease simple carbohydrates, increase lean proteins and exercise to promote weight loss and improve glycemic control .  Sleep disorder  She has been using magnesium  capsules to help with sleep, but does note ongoing issues with sleep.  Plan: We discussed trying Calm magnesium  to help regulate sleep and will monitor response.  She is also planning to see a sleep specialist to help address continued difficulty/lack of restorative sleep.   Hyperlipidemia Management is ongoing with dietary modifications and exercise. Discussed potential impact of menopause on cholesterol levels. She is hesitant to start another medication.  Lab Results  Component Value Date   CHOL 207 (H) 07/17/2023   CHOL 207 (H) 03/27/2023   CHOL 185 10/26/2022   Lab Results  Component Value Date   HDL 64 07/17/2023   HDL 65 03/27/2023   HDL 68 10/26/2022   Lab Results  Component Value Date   LDLCALC 132 (H) 07/17/2023   LDLCALC 131 (H) 03/27/2023   LDLCALC 107 (H) 10/26/2022   Lab Results  Component Value Date   TRIG 62 07/17/2023   TRIG 61 03/27/2023   TRIG 52 10/26/2022  The 10-year ASCVD risk score (Arnett DK, et al., 2019) is: 11.3%   Values used to calculate the score:     Age: 26 years     Clincally relevant sex: Female     Is Non-Hispanic African American: Yes     Diabetic: Yes     Tobacco smoker: No     Systolic Blood Pressure: 126 mmHg     Is BP treated: Yes     HDL Cholesterol: 64 mg/dL     Total Cholesterol: 207 mg/dL  No results found for: CHOLHDL No results found for: LDLDIRECT LDL not at goal. HDL at goal. Trig at goal.   Plan:  Recheck fasting labs next visit.  Continue to work on nutrition  plan -decreasing simple carbohydrates, increasing lean proteins, decreasing saturated fats and cholesterol , avoiding trans fats and exercise as able to promote weight loss, improve lipids and decrease cardiovascular risks.   Vitamin D  deficiency Energy levels are generally reasonable, though she experiences fatigue in the evenings, possibly due to seasonal changes. - Continue vitamin D  supplementation 4000 units daily.  Low vitamin D  levels can be associated with adiposity and may result in leptin resistance and weight gain. Also associated with fatigue.  Currently on vitamin D  supplementation without any adverse effects such as nausea, vomiting or muscle weakness.    Vitals Temp: 98.3 F (36.8 C) BP: 126/83 Pulse Rate: 71 SpO2: 99 %   Anthropometric Measurements Height: 5' 7 (1.702 m) Weight: 180 lb (81.6 kg) BMI (Calculated): 28.19 Weight at Last Visit: 181 lb Weight Lost Since Last Visit: 1 lb Weight Gained Since Last Visit: 0 Starting Weight: 213 lb Total Weight  Loss (lbs): 33 lb (15 kg) Peak Weight: 220 lb   Body Composition  Body Fat %: 39.3 % Fat Mass (lbs): 70.8 lbs Muscle Mass (lbs): 103.6 lbs Total Body Water (lbs): 73 lbs Visceral Fat Rating : 9   Other Clinical Data Fasting: No Labs: No Today's Visit #: 33 Starting Date: 05/12/20     ASSESSMENT AND PLAN:  Diet: Lorrine is currently in the action stage of change. As such, her goal is to continue with weight loss efforts. She has agreed to practicing portion control and making smarter food choices, such as increasing vegetables and decreasing simple carbohydrates.  Exercise: Kaiyla has been instructed to work up to a goal of 150 minutes of combined cardio and strengthening exercise per week for weight loss and overall health benefits.   Behavior Modification:  We discussed the following Behavioral Modification Strategies today: increasing lean protein intake, decreasing simple carbohydrates,  increasing vegetables, increase H2O intake, increase high fiber foods, meal planning and cooking strategies, holiday eating strategies, avoiding temptations, and planning for success. We discussed various medication options to help Kileen with her weight loss efforts and we both agreed to continue current treatment plan.  Return in about 4 weeks (around 02/14/2024) for Fasting Lab.SABRA She was informed of the importance of frequent follow up visits to maximize her success with intensive lifestyle modifications for her multiple health conditions.  Attestation Statements:   Reviewed by clinician on day of visit: allergies, medications, problem list, medical history, surgical history, family history, social history, and previous encounter notes.   Time spent on visit including pre-visit chart review and post-visit care and charting was 37 minutes.    Anahy Esh, PA-C

## 2024-01-17 ENCOUNTER — Other Ambulatory Visit (HOSPITAL_COMMUNITY): Payer: Self-pay

## 2024-01-17 ENCOUNTER — Ambulatory Visit (INDEPENDENT_AMBULATORY_CARE_PROVIDER_SITE_OTHER): Payer: Self-pay | Admitting: Physician Assistant

## 2024-01-17 ENCOUNTER — Encounter (INDEPENDENT_AMBULATORY_CARE_PROVIDER_SITE_OTHER): Payer: Self-pay | Admitting: Physician Assistant

## 2024-01-17 VITALS — BP 126/83 | HR 71 | Temp 98.3°F | Ht 67.0 in | Wt 180.0 lb

## 2024-01-17 DIAGNOSIS — R002 Palpitations: Secondary | ICD-10-CM

## 2024-01-17 DIAGNOSIS — E669 Obesity, unspecified: Secondary | ICD-10-CM

## 2024-01-17 DIAGNOSIS — E559 Vitamin D deficiency, unspecified: Secondary | ICD-10-CM | POA: Diagnosis not present

## 2024-01-17 DIAGNOSIS — G479 Sleep disorder, unspecified: Secondary | ICD-10-CM | POA: Diagnosis not present

## 2024-01-17 DIAGNOSIS — E785 Hyperlipidemia, unspecified: Secondary | ICD-10-CM | POA: Diagnosis not present

## 2024-01-17 DIAGNOSIS — E1165 Type 2 diabetes mellitus with hyperglycemia: Secondary | ICD-10-CM | POA: Diagnosis not present

## 2024-01-17 DIAGNOSIS — E1169 Type 2 diabetes mellitus with other specified complication: Secondary | ICD-10-CM

## 2024-01-17 DIAGNOSIS — Z6828 Body mass index (BMI) 28.0-28.9, adult: Secondary | ICD-10-CM | POA: Diagnosis not present

## 2024-01-17 DIAGNOSIS — Z7985 Long-term (current) use of injectable non-insulin antidiabetic drugs: Secondary | ICD-10-CM | POA: Diagnosis not present

## 2024-01-17 DIAGNOSIS — I152 Hypertension secondary to endocrine disorders: Secondary | ICD-10-CM

## 2024-01-17 MED ORDER — TIRZEPATIDE 12.5 MG/0.5ML ~~LOC~~ SOAJ
12.5000 mg | SUBCUTANEOUS | 1 refills | Status: DC
  Filled 2024-01-17: qty 2, 28d supply, fill #0

## 2024-02-11 NOTE — Progress Notes (Unsigned)
 SUBJECTIVE: Discussed the use of AI scribe software for clinical note transcription with the patient, who gave verbal consent to proceed.  Chief Complaint: Obesity  Interim History: She is up 1 lb since her last visit. Down 32 lbs overall TBW loss of 15.0%  Kimberly Boyd is here to discuss her progress with her obesity treatment plan. She is on the practicing portion control and making smarter food choices, such as increasing vegetables and decreasing simple carbohydrates and states she is following her eating plan approximately 25 % of the time. She states she is exercising walking 30 minutes 3 times per week. Kimberly Boyd Kimberly Boyd is a 58 year old female with obesity and type 2 diabetes who presents for follow-up of her obesity treatment plan.  She follows the portion control and smart choices plan approximately 25% of the time. She consumes more whole foods, meets the recommended protein intake, and maintains adequate hydration. She does not skip meals but struggles to achieve 7 to 9 hours of sleep per night. She engages in 30 minutes of walking three times per week. Since her last visit, she has experienced a slight weight gain of 1 lb over the holiday, but has had a total weight loss of 32 pounds.  She is currently on Mounjaro  12.5 mg weekly and metformin  500 mg daily for type 2 diabetes. She takes Linzess  72 mcg periodically for constipation and vitamin D  4000 units daily.   She is concerned about her cholesterol levels due to recent dietary indulgences over the holiday period.  Fasting labs were obtained today.  The patient was informed we would discuss the lab results at the next visit unless there is a critical issue that needs to be addressed sooner. The patient agreed to keep the next visit at the agreed upon time to discuss these results.   OBJECTIVE: Visit Diagnoses: Problem List Items Addressed This Visit     Hypertension associated with diabetes (HCC)   Relevant  Medications   hydrochlorothiazide  (MICROZIDE ) 12.5 MG capsule   tirzepatide  (MOUNJARO ) 12.5 MG/0.5ML Pen   Generalized obesity   Relevant Medications   tirzepatide  (MOUNJARO ) 12.5 MG/0.5ML Pen   Vitamin D  insufficiency   Relevant Orders   VITAMIN D  25 Hydroxy (Vit-D Deficiency, Fractures)   Type 2 diabetes mellitus with hyperglycemia, without long-term current use of insulin  (HCC) New diagnosis - Primary   Relevant Medications   tirzepatide  (MOUNJARO ) 12.5 MG/0.5ML Pen   Other Relevant Orders   CMP14+EGFR   Hemoglobin A1c   Insulin , random   Other Visit Diagnoses       Hyperlipidemia associated with type 2 diabetes mellitus (HCC)       Relevant Medications   hydrochlorothiazide  (MICROZIDE ) 12.5 MG capsule   tirzepatide  (MOUNJARO ) 12.5 MG/0.5ML Pen   Other Relevant Orders   Lipid Panel With LDL/HDL Ratio     Other fatigue       Relevant Orders   Vitamin B12   TSH     BMI 28.0-28.9,adult Current BMI 28.4         Obesity Management is ongoing with a focus on portion control and increased physical activity. She has lost 32 pounds since the last visit, with a current body adipose percentage of 39%, slightly above the goal of 35%. Muscle mass has increased slightly. She is adhering to a regimen of walking for 30 minutes three times per week and consuming whole foods with adequate protein and hydration. Challenges with exercise due to winter weather and holiday indulgences  were noted. - Continue portion control and whole foods diet. - Encouraged regular physical activity, aiming for 30 minutes of walking three times per week.  Type 2 diabetes mellitus with hyperglycemia and other circulatory and specified complications Type 2 diabetes is managed with Mounjaro  12.5 mg weekly and metformin  500 mg daily. She has experienced significant weight loss, which may positively impact glycemic control. Fasting labs including A1c, insulin , and lipids are planned to assess current status. Coronary  calcium scoring is pending to evaluate cardiovascular risk, with Mounjaro  expected to lower CV risk and improve lipid numbers. Lab Results  Component Value Date   HGBA1C 5.7 (H) 03/27/2023   HGBA1C 6.5 (H) 10/26/2022   HGBA1C 6.0 (H) 11/18/2021   Lab Results  Component Value Date   LDLCALC 132 (H) 07/17/2023   CREATININE 0.87 07/17/2023   INSULIN   Date Value Ref Range Status  07/17/2023 9.8 2.6 - 24.9 uIU/mL Final  ]She is working  on nutrition plan to decrease simple carbohydrates, increase lean proteins and exercise to promote weight loss and improve glycemic control . - Continue/refill Mounjaro  12.5 mg weekly. - Continue metformin  500 mg daily. - Ordered fasting labs including A1c, insulin , and lipids. - Follow up on coronary calcium scoring results. Meds ordered this encounter  Medications   hydrochlorothiazide  (MICROZIDE ) 12.5 MG capsule    Sig: Take 1 capsule (12.5 mg total) by mouth daily.    Dispense:  30 capsule    Refill:  1   tirzepatide  (MOUNJARO ) 12.5 MG/0.5ML Pen    Sig: Inject 12.5 mg into the skin once a week.    Dispense:  2 mL    Refill:  1   Hyperlipidemia LDL is not at goal. Medication(s): On Mounjaro . Not on statin therapy.  Cardiovascular risk factors: diabetes mellitus, dyslipidemia, and hypertension  Lab Results  Component Value Date   CHOL 207 (H) 07/17/2023   HDL 64 07/17/2023   LDLCALC 132 (H) 07/17/2023   TRIG 62 07/17/2023   Lab Results  Component Value Date   ALT 19 07/17/2023   AST 19 07/17/2023   ALKPHOS 79 07/17/2023   BILITOT 0.4 07/17/2023   The 10-year ASCVD risk score (Arnett DK, et al., 2019) is: 11.4%   Values used to calculate the score:     Age: 26 years     Clincally relevant sex: Female     Is Non-Hispanic African American: Yes     Diabetic: Yes     Tobacco smoker: No     Systolic Blood Pressure: 123 mmHg     Is BP treated: Yes     HDL Cholesterol: 64 mg/dL     Total Cholesterol: 207 mg/dL  Plan: Continue  Mounjaro  which should help to lower CV risks.  Continue to work on nutrition plan -decreasing simple carbohydrates, increasing lean proteins, decreasing saturated fats and cholesterol , avoiding trans fats and exercise as able to promote weight loss, improve lipids and decrease cardiovascular risks. May need to add statin therapy.  Recheck lipids today.  Coronary Calcium CT to be done soon and may also help with risk stratification.    Hypertension Hypertension well controlled, improved, asymptomatic, and currently on no medication specifically for BP management with significant weight loss and management on Mounjaro .  Medication(s): None currently- Propranolol  is for palpitations without SE.   BP Readings from Last 3 Encounters:  02/12/24 123/79  01/17/24 126/83  12/11/23 126/82   Lab Results  Component Value Date   CREATININE 0.87 07/17/2023  CREATININE 0.83 04/24/2023   CREATININE 0.86 03/27/2023   No results found for: GFR  Plan: She does feel she has mild edema from time to time and would like to have a small amount of hydrochlorothiazide - Ordered today and monitor lower extremity swelling and BP on this regimen. Continues propranolol  for palpitations.  Continue to work on nutrition plan to promote weight loss and improve BP control.  Meds ordered this encounter  Medications   hydrochlorothiazide  (MICROZIDE ) 12.5 MG capsule    Sig: Take 1 capsule (12.5 mg total) by mouth daily.    Dispense:  30 capsule    Refill:  1   tirzepatide  (MOUNJARO ) 12.5 MG/0.5ML Pen    Sig: Inject 12.5 mg into the skin once a week.    Dispense:  2 mL    Refill:  1  Recheck labs this visit.    Vitamin D  deficiency. Managed with vitamin D  supplementation at 4000 units daily. - Continue vitamin D  4000 units daily.  Fatigue/Poor sleep Recheck vitamin B 12/anemia panel/TSH as well for sources of fatigue.  Vitals Temp: 98.5 F (36.9 C) BP: 123/79 Pulse Rate: 74 SpO2: 98  %   Anthropometric Measurements Height: 5' 7 (1.702 m) Weight: 181 lb (82.1 kg) BMI (Calculated): 28.34 Weight at Last Visit: 180 lb Weight Lost Since Last Visit: 0 Weight Gained Since Last Visit: 1 lb Starting Weight: 213 lb Total Weight Loss (lbs): 32 lb (14.5 kg) Peak Weight: 220 lb   Body Composition  Body Fat %: 39.3 % Fat Mass (lbs): 71.2 lbs Muscle Mass (lbs): 104.4 lbs Total Body Water (lbs): 73.6 lbs Visceral Fat Rating : 9   Other Clinical Data Fasting: Yes Labs: Yes Today's Visit #: 34 Starting Date: 05/12/20     ASSESSMENT AND PLAN:  Diet: Kimberly Boyd is currently in the action stage of change. As such, her goal is to continue with weight loss efforts. She has agreed to practicing portion control and making smarter food choices, such as increasing vegetables and decreasing simple carbohydrates.  Exercise: Kimberly Boyd has been instructed to work up to a goal of 150 minutes of combined cardio and strengthening exercise per week for weight loss and overall health benefits.   Behavior Modification:  We discussed the following Behavioral Modification Strategies today: increasing lean protein intake, decreasing simple carbohydrates, increasing vegetables, increase H2O intake, increase high fiber foods, meal planning and cooking strategies, holiday eating strategies, avoiding temptations, and planning for success. We discussed various medication options to help Kimberly Boyd with her weight loss efforts and we both agreed to continue Mounjaro  12.5 mg weekly and metformin  500 mg daily for T2DM.  Return in about 4 weeks (around 03/11/2024).SABRA She was informed of the importance of frequent follow up visits to maximize her success with intensive lifestyle modifications for her multiple health conditions.  Attestation Statements:   Reviewed by clinician on day of visit: allergies, medications, problem list, medical history, surgical history, family history, social history, and  previous encounter notes.   Time spent on visit including pre-visit chart review and post-visit care and charting was 28 minutes.    Tiahna Cure, PA-C

## 2024-02-12 ENCOUNTER — Ambulatory Visit (INDEPENDENT_AMBULATORY_CARE_PROVIDER_SITE_OTHER): Admitting: Physician Assistant

## 2024-02-12 ENCOUNTER — Other Ambulatory Visit: Payer: Self-pay

## 2024-02-12 ENCOUNTER — Other Ambulatory Visit (HOSPITAL_COMMUNITY): Payer: Self-pay

## 2024-02-12 ENCOUNTER — Encounter (INDEPENDENT_AMBULATORY_CARE_PROVIDER_SITE_OTHER): Payer: Self-pay | Admitting: Physician Assistant

## 2024-02-12 VITALS — BP 123/79 | HR 74 | Temp 98.5°F | Ht 67.0 in | Wt 181.0 lb

## 2024-02-12 DIAGNOSIS — Z7984 Long term (current) use of oral hypoglycemic drugs: Secondary | ICD-10-CM

## 2024-02-12 DIAGNOSIS — G479 Sleep disorder, unspecified: Secondary | ICD-10-CM

## 2024-02-12 DIAGNOSIS — E1159 Type 2 diabetes mellitus with other circulatory complications: Secondary | ICD-10-CM

## 2024-02-12 DIAGNOSIS — E669 Obesity, unspecified: Secondary | ICD-10-CM

## 2024-02-12 DIAGNOSIS — I152 Hypertension secondary to endocrine disorders: Secondary | ICD-10-CM

## 2024-02-12 DIAGNOSIS — E1165 Type 2 diabetes mellitus with hyperglycemia: Secondary | ICD-10-CM | POA: Diagnosis not present

## 2024-02-12 DIAGNOSIS — Z6828 Body mass index (BMI) 28.0-28.9, adult: Secondary | ICD-10-CM | POA: Diagnosis not present

## 2024-02-12 DIAGNOSIS — E1169 Type 2 diabetes mellitus with other specified complication: Secondary | ICD-10-CM | POA: Diagnosis not present

## 2024-02-12 DIAGNOSIS — E785 Hyperlipidemia, unspecified: Secondary | ICD-10-CM | POA: Diagnosis not present

## 2024-02-12 DIAGNOSIS — Z7985 Long-term (current) use of injectable non-insulin antidiabetic drugs: Secondary | ICD-10-CM

## 2024-02-12 DIAGNOSIS — E559 Vitamin D deficiency, unspecified: Secondary | ICD-10-CM | POA: Diagnosis not present

## 2024-02-12 DIAGNOSIS — R5383 Other fatigue: Secondary | ICD-10-CM

## 2024-02-12 MED ORDER — HYDROCHLOROTHIAZIDE 12.5 MG PO CAPS
12.5000 mg | ORAL_CAPSULE | Freq: Every day | ORAL | 1 refills | Status: AC
  Filled 2024-02-12: qty 30, 30d supply, fill #0

## 2024-02-12 MED ORDER — TIRZEPATIDE 12.5 MG/0.5ML ~~LOC~~ SOAJ
12.5000 mg | SUBCUTANEOUS | 1 refills | Status: DC
  Filled 2024-02-12: qty 2, 28d supply, fill #0
  Filled 2024-03-13: qty 2, 28d supply, fill #1

## 2024-02-13 ENCOUNTER — Other Ambulatory Visit (HOSPITAL_COMMUNITY): Payer: Self-pay

## 2024-02-13 ENCOUNTER — Ambulatory Visit (INDEPENDENT_AMBULATORY_CARE_PROVIDER_SITE_OTHER): Payer: Self-pay | Admitting: Physician Assistant

## 2024-02-13 DIAGNOSIS — D23111 Other benign neoplasm of skin of right upper eyelid, including canthus: Secondary | ICD-10-CM | POA: Diagnosis not present

## 2024-02-13 DIAGNOSIS — M7130 Other bursal cyst, unspecified site: Secondary | ICD-10-CM | POA: Diagnosis not present

## 2024-02-13 LAB — CMP14+EGFR
ALT: 12 IU/L (ref 0–32)
AST: 16 IU/L (ref 0–40)
Albumin: 4.2 g/dL (ref 3.8–4.9)
Alkaline Phosphatase: 87 IU/L (ref 49–135)
BUN/Creatinine Ratio: 19 (ref 9–23)
BUN: 15 mg/dL (ref 6–24)
Bilirubin Total: 0.4 mg/dL (ref 0.0–1.2)
CO2: 22 mmol/L (ref 20–29)
Calcium: 10.1 mg/dL (ref 8.7–10.2)
Chloride: 103 mmol/L (ref 96–106)
Creatinine, Ser: 0.79 mg/dL (ref 0.57–1.00)
Globulin, Total: 3.3 g/dL (ref 1.5–4.5)
Glucose: 88 mg/dL (ref 70–99)
Potassium: 4 mmol/L (ref 3.5–5.2)
Sodium: 138 mmol/L (ref 134–144)
Total Protein: 7.5 g/dL (ref 6.0–8.5)
eGFR: 87 mL/min/1.73 (ref >59)

## 2024-02-13 LAB — ANEMIA PANEL
Ferritin: 121 ng/mL (ref 15–150)
Folate, Hemolysate: 292 ng/mL
Folate, RBC: 734 ng/mL (ref >498)
Hematocrit: 39.8 % (ref 34.0–46.6)
Iron Saturation: 29 % (ref 15–55)
Iron: 84 ug/dL (ref 27–159)
Retic Ct Pct: 1.1 % (ref 0.6–2.6)
Total Iron Binding Capacity: 293 ug/dL (ref 250–450)
UIBC: 209 ug/dL (ref 131–425)
Vitamin B-12: 476 pg/mL (ref 232–1245)

## 2024-02-13 LAB — LIPID PANEL WITH LDL/HDL RATIO
Cholesterol, Total: 205 mg/dL — ABNORMAL HIGH (ref 100–199)
HDL: 69 mg/dL (ref >39)
LDL Chol Calc (NIH): 124 mg/dL — ABNORMAL HIGH (ref 0–99)
LDL/HDL Ratio: 1.8 ratio (ref 0.0–3.2)
Triglycerides: 65 mg/dL (ref 0–149)
VLDL Cholesterol Cal: 12 mg/dL (ref 5–40)

## 2024-02-13 LAB — HEMOGLOBIN A1C
Est. average glucose Bld gHb Est-mCnc: 111 mg/dL
Hgb A1c MFr Bld: 5.5 % (ref 4.8–5.6)

## 2024-02-13 LAB — TSH: TSH: 1.62 u[IU]/mL (ref 0.450–4.500)

## 2024-02-13 LAB — INSULIN, RANDOM: INSULIN: 8.5 u[IU]/mL (ref 2.6–24.9)

## 2024-02-13 LAB — VITAMIN D 25 HYDROXY (VIT D DEFICIENCY, FRACTURES): Vit D, 25-Hydroxy: 31.2 ng/mL (ref 30.0–100.0)

## 2024-03-01 ENCOUNTER — Ambulatory Visit (HOSPITAL_BASED_OUTPATIENT_CLINIC_OR_DEPARTMENT_OTHER)
Admission: RE | Admit: 2024-03-01 | Discharge: 2024-03-01 | Disposition: A | Discharge status: Home / Self Care | Payer: Self-pay | Source: Ambulatory Visit | Attending: Family Medicine | Admitting: Family Medicine

## 2024-03-01 DIAGNOSIS — E7800 Pure hypercholesterolemia, unspecified: Secondary | ICD-10-CM | POA: Insufficient documentation

## 2024-03-11 DIAGNOSIS — Z01419 Encounter for gynecological examination (general) (routine) without abnormal findings: Secondary | ICD-10-CM | POA: Diagnosis not present

## 2024-03-13 ENCOUNTER — Other Ambulatory Visit (HOSPITAL_COMMUNITY): Payer: Self-pay

## 2024-03-27 ENCOUNTER — Ambulatory Visit (INDEPENDENT_AMBULATORY_CARE_PROVIDER_SITE_OTHER): Admitting: Physician Assistant

## 2024-03-27 ENCOUNTER — Encounter (INDEPENDENT_AMBULATORY_CARE_PROVIDER_SITE_OTHER): Payer: Self-pay | Admitting: Physician Assistant

## 2024-03-27 ENCOUNTER — Other Ambulatory Visit (HOSPITAL_COMMUNITY): Payer: Self-pay

## 2024-03-27 VITALS — BP 132/75 | HR 71 | Temp 97.9°F | Ht 67.0 in | Wt 183.0 lb

## 2024-03-27 DIAGNOSIS — E1169 Type 2 diabetes mellitus with other specified complication: Secondary | ICD-10-CM

## 2024-03-27 DIAGNOSIS — E1165 Type 2 diabetes mellitus with hyperglycemia: Secondary | ICD-10-CM

## 2024-03-27 DIAGNOSIS — Z7985 Long-term (current) use of injectable non-insulin antidiabetic drugs: Secondary | ICD-10-CM

## 2024-03-27 DIAGNOSIS — E785 Hyperlipidemia, unspecified: Secondary | ICD-10-CM | POA: Diagnosis not present

## 2024-03-27 DIAGNOSIS — E669 Obesity, unspecified: Secondary | ICD-10-CM

## 2024-03-27 DIAGNOSIS — Z6828 Body mass index (BMI) 28.0-28.9, adult: Secondary | ICD-10-CM | POA: Diagnosis not present

## 2024-03-27 DIAGNOSIS — E559 Vitamin D deficiency, unspecified: Secondary | ICD-10-CM

## 2024-03-27 MED ORDER — TIRZEPATIDE 12.5 MG/0.5ML ~~LOC~~ SOAJ
12.5000 mg | SUBCUTANEOUS | 1 refills | Status: AC
  Filled 2024-03-27 – 2024-04-19 (×2): qty 2, 28d supply, fill #0

## 2024-03-27 NOTE — Progress Notes (Signed)
 "  SUBJECTIVE: Discussed the use of AI scribe software for clinical note transcription with the patient, who gave verbal consent to proceed.  Chief Complaint: Obesity  Interim History: She is up 2 lbs since her last visit.  Down 30 lbs overall TBW loss of 14.1%  Kimberly Boyd is here to discuss her progress with her obesity treatment plan. She is on the practicing portion control and making smarter food choices, such as increasing vegetables and decreasing simple carbohydrates and states she is following her eating plan approximately 75 % of the time. She states she is exercising cardio-bike and walking pad 30 minutes 2 times per week.  Kimberly Boyd Kimberly Boyd is a 59 year old female with obesity who presents for follow-up on her obesity treatment plan.  She is adhering to a portion control Smart Choices plan approximately 75% of the time, with adequate protein intake and increased water consumption. She is working on incorporating more fruits and vegetables into her diet. Despite these efforts, she has gained two pounds since her last visit, although she is still down a total of thirty pounds overall.  She engages in physical activity by doing cardio exercises, including biking and using a walking pad, for thirty minutes two days a week. However, she is not consistently sleeping at least seven hours per night, which remains an ongoing issue. Her sleep pattern involves going to bed around 11:00 to 11:30 PM and waking up at 5:00 AM, with occasional difficulty returning to sleep if she wakes up earlier.  She experiences cravings, particularly for sweet and salty foods, which were exacerbated during the holiday season. She is attempting to manage these cravings by having healthier snacks available, such as yogurt, apples with peanut butter, and chickpeas. She also notes experiencing constipation, which she attributes to dietary changes during the holidays.  She is currently taking Mounjaro  and has  not been using metformin , and we discussed that resumption of metformin  could potentially help with her constipation.  OBJECTIVE: Visit Diagnoses: Problem List Items Addressed This Visit     Generalized obesity   Relevant Medications   tirzepatide  (MOUNJARO ) 12.5 MG/0.5ML Pen   Vitamin D  insufficiency   Type 2 diabetes mellitus with hyperglycemia, without long-term current use of insulin  (HCC) New diagnosis - Primary   Relevant Medications   tirzepatide  (MOUNJARO ) 12.5 MG/0.5ML Pen   Other Visit Diagnoses       Hyperlipidemia associated with type 2 diabetes mellitus (HCC)       Relevant Medications   tirzepatide  (MOUNJARO ) 12.5 MG/0.5ML Pen     BMI 28.0-28.9,adult Current BMI 28.7          Obesity Management is ongoing with a focus on portion control and increased physical activity. She has gained 2 pounds since the last visit but has lost a total of 30 pounds.  She is following a portion control Smart Choices plan about 75% of the time, consuming adequate protein, and drinking enough water. She is improving in avoiding skipping meals and engaging in cardio activities such as biking and walking for 30 minutes twice a week.  She is experiencing cravings and has indulged during the holidays, leading to weight gain. She is working on field seismologist intake and incorporating more fruits and vegetables into her diet. She is also focusing on healthy snacking options to manage cravings. - Continue portion control Smart Choices plan. - Encouraged increased water intake and consumption of fruits and vegetables. - Advised on healthy snacking options such as yogurt, granola,  and fiber-rich foods. - Encouraged regular physical activity, including cardio exercises. - Discussed the importance of discipline in self-care and maintaining a balanced diet.  Type 2 diabetes mellitus with hyperglycemia and hyperlipidemia Type 2 diabetes management is ongoing with a focus on medication adherence and  lifestyle modifications. She is not currently taking metformin  but is on Mounjaro  12.5 mg weekly.  Denies mass in neck, dysphagia, dyspepsia, persistent hoarseness, abdominal pain, or N/V/Constipation or diarrhea. Has annual eye exam. Mood is stable.  Some constipation- but may be related to decreased whole food intake over holidays  Her coronary calcium score is zero, which is reassuring, but her risk of a cardiac or other vascular event is still higher than desired at 13.2%. Her LDL has improved, and her HDL is high, which is cardioprotective. There is a consideration for starting a statin due to her risk profile, but she is hesitant. She is also experiencing constipation, which may be related to her diet and medication use. Lab Results  Component Value Date   HGBA1C 5.5 02/12/2024   HGBA1C 5.7 (H) 03/27/2023   HGBA1C 6.5 (H) 10/26/2022   Lab Results  Component Value Date   LDLCALC 124 (H) 02/12/2024   CREATININE 0.79 02/12/2024   A1c is at goal.  - Continue Mounjaro12.5 mg weekly  with one refill available. - Discuss potential statin therapy with primary care provider/cardiology. - Encouraged dietary modifications to reduce saturated fat intake. - Consider Calm magnesium  for sleep and constipation management. - Discuss with primary care provider about Xanax  refill for anxiety management. Meds ordered this encounter  Medications   tirzepatide  (MOUNJARO ) 12.5 MG/0.5ML Pen    Sig: Inject 12.5 mg into the skin once a week.    Dispense:  2 mL    Refill:  1    Hyperlipidemia LDL is not at goal. Medication(s): Not on statin therapy   On Mounjaro  since 2024, but little improvement in LDL  Coronary calcium score- 0 Cardiovascular risk factors: diabetes mellitus, dyslipidemia, hypertension, and sedentary lifestyle  Lab Results  Component Value Date   CHOL 205 (H) 02/12/2024   HDL 69 02/12/2024   LDLCALC 124 (H) 02/12/2024   TRIG 65 02/12/2024   Lab Results  Component Value Date    ALT 12 02/12/2024   AST 16 02/12/2024   ALKPHOS 87 02/12/2024   BILITOT 0.4 02/12/2024   The 10-year ASCVD risk score (Arnett DK, et al., 2019) is: 13.2%   Values used to calculate the score:     Age: 27 years     Clinically relevant sex: Female     Is Non-Hispanic African American: Yes     Diabetic: Yes     Tobacco smoker: No     Systolic Blood Pressure: 132 mmHg     Is BP treated: Yes     HDL Cholesterol: 69 mg/dL     Total Cholesterol: 205 mg/dL  Plan: CCS of 0 is somewhat reassuring, but given T2DM diagnosis and higher risk score, may still want to consider statin or other medications to improve lipids. She would rather not start statin therapy at this time. Plans to discuss with PCP/cardiology Continue Mounjaro   Continue to work on nutrition plan -decreasing simple carbohydrates, increasing lean proteins, decreasing saturated fats and cholesterol , avoiding trans fats and exercise as able to promote weight loss, improve lipids and decrease cardiovascular risks.   Vitals Temp: 97.9 F (36.6 C) BP: 132/75 Pulse Rate: 71 SpO2: 99 %   Anthropometric Measurements Height: 5' 7 (  1.702 m) Weight: 183 lb (83 kg) BMI (Calculated): 28.66 Weight at Last Visit: 181 lb Weight Lost Since Last Visit: 0 Weight Gained Since Last Visit: 2 Starting Weight: 213 lb Total Weight Loss (lbs): 30 lb (13.6 kg) Peak Weight: 220 lb   Body Composition  Body Fat %: 41.4 % Fat Mass (lbs): 75.8 lbs Muscle Mass (lbs): 102 lbs Total Body Water (lbs): 77.4 lbs Visceral Fat Rating : 10   Other Clinical Data Today's Visit #: 35 Starting Date: 05/12/20     ASSESSMENT AND PLAN:  Diet: Landree is currently in the action stage of change. As such, her goal is to continue with weight loss efforts. She has agreed to practicing portion control and making smarter food choices, such as increasing vegetables and decreasing simple carbohydrates.  Exercise: Aleana has been instructed to work up  to a goal of 150 minutes of combined cardio and strengthening exercise per week for weight loss and overall health benefits.   Behavior Modification:  We discussed the following Behavioral Modification Strategies today: increasing lean protein intake, decreasing simple carbohydrates, increasing vegetables, increase H2O intake, increase high fiber foods, meal planning and cooking strategies, better snacking choices, avoiding temptations, and planning for success. We discussed various medication options to help Tanara with her weight loss efforts and we both agreed to continue Mounjaro  12.5 mg weekly for T2DM as primary indication.  Return in about 5 weeks (around 05/01/2024).SABRA She was informed of the importance of frequent follow up visits to maximize her success with intensive lifestyle modifications for her multiple health conditions.  Attestation Statements:   Reviewed by clinician on day of visit: allergies, medications, problem list, medical history, surgical history, family history, social history, and previous encounter notes.   Time spent on visit including pre-visit chart review and post-visit care and charting was 35 minutes.    Jashawn Floyd, PA-C "

## 2024-03-28 ENCOUNTER — Other Ambulatory Visit (HOSPITAL_COMMUNITY): Payer: Self-pay

## 2024-04-17 ENCOUNTER — Other Ambulatory Visit (HOSPITAL_COMMUNITY): Payer: Self-pay

## 2024-04-17 ENCOUNTER — Encounter (INDEPENDENT_AMBULATORY_CARE_PROVIDER_SITE_OTHER): Payer: Self-pay | Admitting: Physician Assistant

## 2024-04-19 ENCOUNTER — Other Ambulatory Visit (HOSPITAL_COMMUNITY): Payer: Self-pay

## 2024-04-30 ENCOUNTER — Ambulatory Visit (INDEPENDENT_AMBULATORY_CARE_PROVIDER_SITE_OTHER): Admitting: Physician Assistant
